# Patient Record
Sex: Female | Born: 1990 | Race: Black or African American | Hispanic: No | Marital: Single | Smoking: Never smoker
Health system: Southern US, Community
[De-identification: ages and names within clinical notes are randomized; demographics above are authoritative.]

## PROBLEM LIST (undated history)

## (undated) DIAGNOSIS — F329 Major depressive disorder, single episode, unspecified: Secondary | ICD-10-CM

## (undated) DIAGNOSIS — J45909 Unspecified asthma, uncomplicated: Secondary | ICD-10-CM

## (undated) DIAGNOSIS — I10 Essential (primary) hypertension: Secondary | ICD-10-CM

## (undated) DIAGNOSIS — F32A Depression, unspecified: Secondary | ICD-10-CM

## (undated) DIAGNOSIS — Z87442 Personal history of urinary calculi: Secondary | ICD-10-CM

## (undated) DIAGNOSIS — R7303 Prediabetes: Secondary | ICD-10-CM

## (undated) DIAGNOSIS — N200 Calculus of kidney: Secondary | ICD-10-CM

## (undated) DIAGNOSIS — F419 Anxiety disorder, unspecified: Secondary | ICD-10-CM

## (undated) DIAGNOSIS — D649 Anemia, unspecified: Secondary | ICD-10-CM

## (undated) DIAGNOSIS — T7840XA Allergy, unspecified, initial encounter: Secondary | ICD-10-CM

## (undated) HISTORY — DX: Depression, unspecified: F32.A

## (undated) HISTORY — DX: Allergy, unspecified, initial encounter: T78.40XA

## (undated) HISTORY — DX: Anxiety disorder, unspecified: F41.9

## (undated) HISTORY — DX: Anemia, unspecified: D64.9

## (undated) HISTORY — PX: KNEE SURGERY: SHX244

## (undated) HISTORY — PX: WISDOM TOOTH EXTRACTION: SHX21

## (undated) HISTORY — PX: TONSILLECTOMY: SUR1361

## (undated) HISTORY — DX: Calculus of kidney: N20.0

---
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
## 2011-11-20 DIAGNOSIS — R87810 Cervical high risk human papillomavirus (HPV) DNA test positive: Secondary | ICD-10-CM | POA: Insufficient documentation

## 2011-11-20 DIAGNOSIS — N870 Mild cervical dysplasia: Secondary | ICD-10-CM | POA: Insufficient documentation

## 2012-01-05 DIAGNOSIS — IMO0002 Reserved for concepts with insufficient information to code with codable children: Secondary | ICD-10-CM | POA: Insufficient documentation

## 2012-12-15 DIAGNOSIS — Z6836 Body mass index (BMI) 36.0-36.9, adult: Secondary | ICD-10-CM | POA: Insufficient documentation

## 2013-01-20 DIAGNOSIS — Z833 Family history of diabetes mellitus: Secondary | ICD-10-CM | POA: Insufficient documentation

## 2013-01-20 DIAGNOSIS — Z3480 Encounter for supervision of other normal pregnancy, unspecified trimester: Secondary | ICD-10-CM | POA: Insufficient documentation

## 2013-05-15 DIAGNOSIS — O1400 Mild to moderate pre-eclampsia, unspecified trimester: Secondary | ICD-10-CM

## 2013-05-15 HISTORY — DX: Mild to moderate pre-eclampsia, unspecified trimester: O14.00

## 2013-05-16 DIAGNOSIS — O1410 Severe pre-eclampsia, unspecified trimester: Secondary | ICD-10-CM

## 2013-06-27 DIAGNOSIS — I10 Essential (primary) hypertension: Secondary | ICD-10-CM | POA: Insufficient documentation

## 2013-06-27 DIAGNOSIS — Z0000 Encounter for general adult medical examination without abnormal findings: Secondary | ICD-10-CM | POA: Insufficient documentation

## 2013-08-19 DIAGNOSIS — Z975 Presence of (intrauterine) contraceptive device: Secondary | ICD-10-CM | POA: Insufficient documentation

## 2017-12-24 ENCOUNTER — Encounter: Payer: Self-pay | Admitting: Obstetrics

## 2017-12-24 ENCOUNTER — Ambulatory Visit (INDEPENDENT_AMBULATORY_CARE_PROVIDER_SITE_OTHER): Payer: No Typology Code available for payment source | Admitting: Obstetrics

## 2017-12-24 VITALS — BP 134/88 | HR 81 | Ht 60.0 in | Wt 210.5 lb

## 2017-12-24 DIAGNOSIS — Z01419 Encounter for gynecological examination (general) (routine) without abnormal findings: Secondary | ICD-10-CM

## 2017-12-24 DIAGNOSIS — Z124 Encounter for screening for malignant neoplasm of cervix: Secondary | ICD-10-CM

## 2017-12-24 DIAGNOSIS — Z113 Encounter for screening for infections with a predominantly sexual mode of transmission: Secondary | ICD-10-CM | POA: Diagnosis not present

## 2017-12-24 DIAGNOSIS — N898 Other specified noninflammatory disorders of vagina: Secondary | ICD-10-CM | POA: Diagnosis not present

## 2017-12-24 NOTE — Progress Notes (Signed)
Subjective:        April Davenport is a 27 y.o. female here for a routine exam.  Current complaints: NONE.    Personal health questionnaire:  Is patient Ashkenazi Jewish, have a family history of breast and/or ovarian cancer: no Is there a family history of uterine cancer diagnosed at age < 2050, gastrointestinal cancer, urinary tract cancer, family member who is a Personnel officerLynch syndrome-associated carrier: no Is the patient overweight and hypertensive, family history of diabetes, personal history of gestational diabetes, preeclampsia or PCOS: no Is patient over 3355, have PCOS,  family history of premature CHD under age 29, diabetes, smoke, have hypertension or peripheral artery disease:  no At any time, has a partner hit, kicked or otherwise hurt or frightened you?: no Over the past 2 weeks, have you felt down, depressed or hopeless?: no Over the past 2 weeks, have you felt little interest or pleasure in doing things?:no   Gynecologic History Patient's last menstrual period was 11/24/2017. Contraception: IUD Last Pap: unknown. Results were: unknown Last mammogram: n/a. Results were: n/a  Obstetric History OB History  Gravida Para Term Preterm AB Living  2 1     1 1   SAB TAB Ectopic Multiple Live Births  1       1    # Outcome Date GA Lbr Len/2nd Weight Sex Delivery Anes PTL Lv  2 Para 05/16/13 [id number]   F CS-LVertical Spinal  LIV  1 SAB             History reviewed. No pertinent past medical history.  Past Surgical History:  Procedure Laterality Date  . CESAREAN SECTION  05/16/2013     Current Outpatient Medications:  .  levonorgestrel (MIRENA) 20 MCG/24HR IUD, 1 each by Intrauterine route once., Disp: , Rfl:  No Known Allergies  Social History   Tobacco Use  . Smoking status: Never Smoker  . Smokeless tobacco: Never Used  Substance Use Topics  . Alcohol use: Not Currently    Frequency: Never    Family History  Problem Relation Age of Onset  . Diabetes Mother   . Heart  attack Father   . Diabetes Paternal Grandfather       Review of Systems  Constitutional: negative for fatigue and weight loss Respiratory: negative for cough and wheezing Cardiovascular: negative for chest pain, fatigue and palpitations Gastrointestinal: negative for abdominal pain and change in bowel habits Musculoskeletal:negative for myalgias Neurological: negative for gait problems and tremors Behavioral/Psych: negative for abusive relationship, depression Endocrine: negative for temperature intolerance    Genitourinary:negative for abnormal menstrual periods, genital lesions, hot flashes, sexual problems and vaginal discharge Integument/breast: negative for breast lump, breast tenderness, nipple discharge and skin lesion(s)    Objective:       BP 134/88   Pulse 81   Ht 5' (1.524 m)   Wt 210 lb 8 oz (95.5 kg)   LMP 11/24/2017   HC 1" (2.5 cm)   BMI 41.11 kg/m  General:   alert  Skin:   no rash or abnormalities  Lungs:   clear to auscultation bilaterally  Heart:   regular rate and rhythm, S1, S2 normal, no murmur, click, rub or gallop  Breasts:   normal without suspicious masses, skin or nipple changes or axillary nodes  Abdomen:  normal findings: no organomegaly, soft, non-tender and no hernia  Pelvis:  External genitalia: normal general appearance Urinary system: urethral meatus normal and bladder without fullness, nontender Vaginal: normal without tenderness, induration or  masses Cervix: normal appearance Adnexa: normal bimanual exam Uterus: anteverted and non-tender, normal size   Lab Review Urine pregnancy test Labs reviewed no Radiologic studies reviewed no  50% of 20 min visit spent on counseling and coordination of care.   Assessment:     1. Screen for STD (sexually transmitted disease) Rx: - Hepatitis B surface antigen - Hepatitis C antibody - HIV antibody - RPR - Cervicovaginal ancillary only  2. Vaginal discharge Rx: - Cervicovaginal  ancillary only  3. Encounter for routine gynecological examination with Papanicolaou smear of cervix Rx; - Cytology - PAP    Plan:    Education reviewed: calcium supplements, depression evaluation, low fat, low cholesterol diet, safe sex/STD prevention, self breast exams and weight bearing exercise. Contraception: IUD. Follow up in: 1 year.   No orders of the defined types were placed in this encounter.  Orders Placed This Encounter  Procedures  . Hepatitis B surface antigen  . Hepatitis C antibody  . HIV antibody  . RPR    Brock Bad MD 12-24-2017

## 2017-12-24 NOTE — Progress Notes (Signed)
Pt presents for annual, pap, and all STD testing. Pt aware insurance may not cover STD/BV/Yeast testing and agrees to be billed if not.

## 2017-12-25 LAB — CERVICOVAGINAL ANCILLARY ONLY
BACTERIAL VAGINITIS: POSITIVE — AB
CANDIDA VAGINITIS: NEGATIVE
CHLAMYDIA, DNA PROBE: NEGATIVE
Neisseria Gonorrhea: NEGATIVE
Trichomonas: NEGATIVE

## 2017-12-25 LAB — RPR: RPR Ser Ql: NONREACTIVE

## 2017-12-25 LAB — HIV ANTIBODY (ROUTINE TESTING W REFLEX): HIV Screen 4th Generation wRfx: NONREACTIVE

## 2017-12-25 LAB — HEPATITIS C ANTIBODY: Hep C Virus Ab: <0.1 s/co ratio (ref 0.0–0.9)

## 2017-12-25 LAB — CYTOLOGY - PAP: Diagnosis: NEGATIVE

## 2017-12-25 LAB — HEPATITIS B SURFACE ANTIGEN: HEP B S AG: NEGATIVE

## 2018-01-01 ENCOUNTER — Telehealth: Payer: Self-pay

## 2018-01-01 NOTE — Telephone Encounter (Signed)
Pt called for Test Results, Request call back please @ [phone].

## 2018-01-05 ENCOUNTER — Other Ambulatory Visit: Payer: Self-pay

## 2018-01-05 MED ORDER — METRONIDAZOLE 500 MG PO TABS: 500.0000 mg | ORAL_TABLET | Freq: Two times a day (BID) | ORAL | 0 refills | Status: DC

## 2018-01-05 NOTE — Telephone Encounter (Signed)
Call patient to inform her of positive BV results and the need to start taking flagyl to clear up infection.

## 2018-02-23 ENCOUNTER — Encounter (HOSPITAL_COMMUNITY): Payer: Self-pay | Admitting: Emergency Medicine

## 2018-02-23 ENCOUNTER — Emergency Department (HOSPITAL_COMMUNITY): Payer: No Typology Code available for payment source

## 2018-02-23 ENCOUNTER — Emergency Department (HOSPITAL_COMMUNITY)
Admission: EM | Admit: 2018-02-23 | Discharge: 2018-02-23 | Disposition: A | Discharge status: Home / Self Care | Payer: No Typology Code available for payment source | Attending: Emergency Medicine | Admitting: Emergency Medicine

## 2018-02-23 DIAGNOSIS — I10 Essential (primary) hypertension: Secondary | ICD-10-CM | POA: Insufficient documentation

## 2018-02-23 DIAGNOSIS — Z79899 Other long term (current) drug therapy: Secondary | ICD-10-CM | POA: Diagnosis not present

## 2018-02-23 DIAGNOSIS — R109 Unspecified abdominal pain: Secondary | ICD-10-CM

## 2018-02-23 DIAGNOSIS — R1032 Left lower quadrant pain: Secondary | ICD-10-CM | POA: Insufficient documentation

## 2018-02-23 HISTORY — DX: Essential (primary) hypertension: I10

## 2018-02-23 LAB — CBC WITH DIFFERENTIAL/PLATELET
Basophils Absolute: 0.1 10*3/uL (ref 0.0–0.1)
Basophils Relative: 1 %
EOS PCT: 3 %
Eosinophils Absolute: 0.2 10*3/uL (ref 0.0–0.7)
HEMATOCRIT: 40 % (ref 36.0–46.0)
HEMOGLOBIN: 13.4 g/dL (ref 12.0–15.0)
LYMPHS ABS: 3.7 10*3/uL (ref 0.7–4.0)
LYMPHS PCT: 52 %
MCH: 27.4 pg (ref 26.0–34.0)
MCHC: 33.5 g/dL (ref 30.0–36.0)
MCV: 81.8 fL (ref 78.0–100.0)
Monocytes Absolute: 0.4 10*3/uL (ref 0.1–1.0)
Monocytes Relative: 6 %
NEUTROS ABS: 2.6 10*3/uL (ref 1.7–7.7)
Neutrophils Relative %: 38 %
PLATELETS: 397 10*3/uL (ref 150–400)
RBC: 4.89 MIL/uL (ref 3.87–5.11)
RDW: 12.5 % (ref 11.5–15.5)
WBC: 6.9 10*3/uL (ref 4.0–10.5)

## 2018-02-23 LAB — URINALYSIS, ROUTINE W REFLEX MICROSCOPIC
BILIRUBIN URINE: NEGATIVE
Glucose, UA: NEGATIVE mg/dL
KETONES UR: NEGATIVE mg/dL
LEUKOCYTES UA: NEGATIVE
NITRITE: NEGATIVE
Protein, ur: 100 mg/dL — AB
Specific Gravity, Urine: 1.014 (ref 1.005–1.030)
pH: 9 — ABNORMAL HIGH (ref 5.0–8.0)

## 2018-02-23 LAB — I-STAT BETA HCG BLOOD, ED (MC, WL, AP ONLY): I-stat hCG, quantitative: <5 m[IU]/mL (ref <5)

## 2018-02-23 LAB — BASIC METABOLIC PANEL
Anion gap: 13 (ref 5–15)
BUN: 17 mg/dL (ref 6–20)
CHLORIDE: 104 mmol/L (ref 98–111)
CO2: 23 mmol/L (ref 22–32)
Calcium: 10 mg/dL (ref 8.9–10.3)
Creatinine, Ser: 0.79 mg/dL (ref 0.44–1.00)
GFR calc non Af Amer: >60 mL/min (ref >60)
Glucose, Bld: 116 mg/dL — ABNORMAL HIGH (ref 70–99)
Potassium: 3.2 mmol/L — ABNORMAL LOW (ref 3.5–5.1)
Sodium: 140 mmol/L (ref 135–145)

## 2018-02-23 MED ORDER — HYDROMORPHONE HCL 1 MG/ML IJ SOLN
1.0000 mg | Freq: Once | INTRAMUSCULAR | Status: AC
  Administered 2018-02-23: 1 mg via INTRAVENOUS
  Filled 2018-02-23: qty 1

## 2018-02-23 MED ORDER — POTASSIUM CHLORIDE CRYS ER 20 MEQ PO TBCR
40.0000 meq | EXTENDED_RELEASE_TABLET | Freq: Once | ORAL | Status: AC
  Administered 2018-02-23: 40 meq via ORAL
  Filled 2018-02-23: qty 2

## 2018-02-23 MED ORDER — ONDANSETRON HCL 4 MG/2ML IJ SOLN
4.0000 mg | Freq: Once | INTRAMUSCULAR | Status: AC
  Administered 2018-02-23: 4 mg via INTRAVENOUS
  Filled 2018-02-23: qty 2

## 2018-02-23 MED ORDER — POLYETHYLENE GLYCOL 3350 17 G PO PACK: 17.0000 g | PACK | Freq: Every day | ORAL | 0 refills | Status: DC

## 2018-02-23 MED ORDER — SODIUM CHLORIDE 0.9 % IV BOLUS
1000.0000 mL | Freq: Once | INTRAVENOUS | Status: AC
  Administered 2018-02-23: 1000 mL via INTRAVENOUS

## 2018-02-23 NOTE — ED Notes (Signed)
ED Provider at bedside. 

## 2018-02-23 NOTE — Discharge Instructions (Addendum)
You were evaluated in the emergency department for right-sided flank and abdominal pain.  You had blood work and a CAT scan.  There was some swelling of the kidney tube on the right side and you likely had a kidney stone pass on that side.  We are prescribing you some medication to help with constipation as you also look like you have a lot of stool on the CAT scan.  Please stay well-hydrated and take Tylenol or ibuprofen for pain.  We are giving the number for urology group to follow-up with.  Return if any concerns.

## 2018-02-23 NOTE — ED Triage Notes (Signed)
Pt reports was in [facility] Saturday with kidney stones. Pt screaming in pain on right side. Reports that does have blood in urine.

## 2018-02-23 NOTE — ED Provider Notes (Signed)
[facility]-EMERGENCY DEPT Provider Note   CSN: [id number] Arrival date & time: 02/23/18  0946     History   Chief Complaint Chief Complaint  Patient presents with  . Flank Pain    HPI April Davenport is a 27 y.o. female.  She presents to the emergency department with severe right flank into right lower quadrant abdominal pain.  She states she started with symptoms on Thursday and Saturday was at [facility] where they diagnosed her with a kidney stone and urine infection.  She is on antibiotics but not on any pain medicine.  She is been taking ibuprofen but the pain is getting worse and is now severe.  She is been having some blood in her urine.  Last menstrual period was about a month ago.  Denies any fevers or chills.  She is nauseous but has not vomited.  No diarrhea or constipation.  The history is provided by the patient.  Flank Pain  This is a new problem. The current episode started more than 2 days ago. The problem occurs constantly. The problem has been gradually worsening. Associated symptoms include abdominal pain. Pertinent negatives include no chest pain, no headaches and no shortness of breath. Nothing aggravates the symptoms. Nothing relieves the symptoms. The treatment provided no relief.    Past Medical History:  Diagnosis Date  . Hypertension     There are no active problems to display for this patient.   Past Surgical History:  Procedure Laterality Date  . CESAREAN SECTION  05/16/2013     OB History    Gravida  2   Para  1   Term      Preterm      AB  1   Living  1     SAB  1   TAB      Ectopic      Multiple      Live Births  1            Home Medications    Prior to Admission medications   Medication Sig Start Date End Date Taking? Authorizing Provider  levonorgestrel (MIRENA) 20 MCG/24HR IUD 1 each by Intrauterine route once. 09/05/14 09/05/19  [provider]  metroNIDAZOLE (FLAGYL) 500 MG  tablet Take 1 tablet (500 mg total) by mouth 2 (two) times daily. 01/05/18   Brock Bad, MD    Family History Family History  Problem Relation Age of Onset  . Diabetes Mother   . Heart attack Father   . Diabetes Paternal Grandfather     Social History Social History   Tobacco Use  . Smoking status: Never Smoker  . Smokeless tobacco: Never Used  Substance Use Topics  . Alcohol use: Not Currently    Frequency: Never  . Drug use: Never     Allergies   Patient has no known allergies.   Review of Systems Review of Systems  Constitutional: Negative for fever.  HENT: Negative for sore throat.   Eyes: Negative for visual disturbance.  Respiratory: Negative for shortness of breath.   Cardiovascular: Negative for chest pain.  Gastrointestinal: Positive for abdominal pain and nausea. Negative for vomiting.  Genitourinary: Positive for flank pain and hematuria. Negative for dysuria.  Musculoskeletal: Positive for back pain. Negative for neck pain.  Skin: Negative for rash.  Neurological: Negative for headaches.     Physical Exam Updated Vital Signs BP (!) 152/100 (BP Location: Right Arm)   Pulse 81   Temp 98.1  F (36.7 C) (Oral)   Resp 20   LMP 01/26/2018   SpO2 100%   Physical Exam  Constitutional: She appears well-developed and well-nourished. She appears distressed.  HENT:  Head: Normocephalic and atraumatic.  Eyes: Conjunctivae are normal.  Neck: Neck supple.  Cardiovascular: Normal rate and regular rhythm.  No murmur heard. Pulmonary/Chest: Effort normal and breath sounds normal. No respiratory distress.  Abdominal: Soft. There is no tenderness.  Musculoskeletal: She exhibits no edema or deformity.  Neurological: She is alert.  Skin: Skin is warm and dry.  Psychiatric: She has a normal mood and affect.  Nursing note and vitals reviewed.    ED Treatments / Results  Labs (all labs ordered are listed, but only abnormal results are displayed) Labs  Reviewed  BASIC METABOLIC PANEL - Abnormal; Notable for the following components:      Result Value   Potassium 3.2 (*)    Glucose, Bld 116 (*)    All other components within normal limits  URINALYSIS, ROUTINE W REFLEX MICROSCOPIC - Abnormal; Notable for the following components:   pH 9.0 (*)    Hgb urine dipstick MODERATE (*)    Protein, ur 100 (*)    RBC / HPF >50 (*)    Bacteria, UA FEW (*)    All other components within normal limits  CBC WITH DIFFERENTIAL/PLATELET  I-STAT BETA HCG BLOOD, ED ([facility], [facility], [facility] ONLY)    EKG EKG Interpretation  Date/Time:  Tuesday February 23 2018 10:36:44 EDT Ventricular Rate:  91 PR Interval:    QRS Duration: 89 QT Interval:  402 QTC Calculation: 495 R Axis:   69 Text Interpretation:  Sinus rhythm Borderline prolonged QT interval no prior to compare with Confirmed by Meridee ScoreButler, Collette Pescador [phone]([id number]) on 02/23/2018 11:13:21 AM   Radiology Ct Renal Stone Study  Result Date: 02/23/2018 CLINICAL DATA:  Right flank pain and hematuria EXAM: CT ABDOMEN AND PELVIS WITHOUT CONTRAST TECHNIQUE: Multidetector CT imaging of the abdomen and pelvis was performed following the standard protocol without oral or IV contrast. COMPARISON:  None. FINDINGS: Lower chest: Lung bases are clear. Hepatobiliary: No focal liver lesions are evident on this noncontrast enhanced study. There is no appreciable gallbladder wall thickening. No evident biliary duct dilatation. Pancreas: No pancreatic mass or inflammatory focus. Spleen: No splenic lesions are evident. Adrenals/Urinary Tract: Adrenals bilaterally appear normal. Right kidney is subtly edematous. There is no renal mass on either side. There is moderate hydronephrosis on the right. There is no hydronephrosis on the left. There is no intrarenal calculus on either side. There is no appreciable ureteral calculus on either side. The right ureter is prominent. Urinary bladder is midline with wall thickness within normal limits.  Stomach/Bowel: There is moderate stool in the colon. There is no appreciable bowel wall or mesenteric thickening. No evident bowel obstruction. No free air or portal venous air. Stomach is borderline distended with food material and fluid. Vascular/Lymphatic: No abdominal aortic aneurysm. No arterial vascular calcification evident. No adenopathy evident in the abdomen or pelvis by size criteria. Several small mesenteric lymph nodes are noted in the right mid to lower abdomen. Reproductive: Uterus is anteverted. Intrauterine device is positioned within the endometrium. There is no pelvic mass. A small amount of fluid tracks from the left ovary along the lateral distal sigmoid colon. Other: Appendix appears normal. No abscess or ascites is evident in the abdomen or pelvis. There is a small ventral hernia containing only fat. Musculoskeletal: There are no blastic or lytic bone lesions.  No intramuscular lesions are evident. IMPRESSION: 1. Moderate hydronephrosis and ureterectasis on the right without appreciable ureteral calculus. Question recent calculus passage. Pyelonephritis could present similarly. Right kidney is subtly edematous. Note that there is no perinephric fluid or stranding, and no renal abscess noted on the right. 2.  No intrarenal calculi evident in either kidney. 3. No bowel obstruction. No free air or portal venous air. No abscess in the abdomen or pelvis. Appendix appears normal. 4. Several small lymph nodes in the right mid to lower abdomen. Question reactive etiology due to the changes involving the right kidney and ureter. A degree of mesenteric adenitis in the appropriate clinical setting could also present in this manner. 5.  Intrauterine device positioned within the endometrium. 6.  Small ventral hernia containing only fat. Electronically Signed   By: Bretta Bang III M.D.   On: 02/23/2018 11:59    Procedures Procedures (including critical care time)  Medications Ordered in  ED Medications  sodium chloride 0.9 % bolus 1,000 mL (has no administration in time range)  ondansetron (ZOFRAN) injection 4 mg (has no administration in time range)  HYDROmorphone (DILAUDID) injection 1 mg (has no administration in time range)     Initial Impression / Assessment and Plan / ED Course  I have reviewed the triage vital signs and the nursing notes.  Pertinent labs & imaging results that were available during my care of the patient were reviewed by me and considered in my medical decision making (see chart for details).  Clinical Course as of Feb 24 1740  Tue Feb 23, 2018  4740 27 year old female with recent diagnosis of kidney stones by CT here with worsening right flank and right lower quadrant abdominal pain.  There is no report on chart review her care everywhere regarding the patient's imaging or work-up from prior ED visit in [facility].  Will initially start with blood work urinalysis and pain control.   [MB]  1049 About 15 minutes after patient received Dilaudid she was very somnolent.  We placed her on some nasal cannula oxygen and put her on a cardiac monitor.  She states she felt a little itchy in the face and tingly all over.  Pain is improved.   [MB]  1217 CT does not show an obvious stone on the right side although does have some element of hydro-question passed stone.   [MB]  1310 I reviewed the results of the patient's CAT scan lab work and urinalysis of her.  My guess is that she had a stone on the right that is passed and still has residual discomfort from the hydro-.  Her potassium was little low here 3.2 and that will be repleted.  She is already on antibiotics for an equivocal UTI.  I am also going to prescribe her some laxative and give her the number for urologist for follow-up.  All questions were answered and she understands indications for return.   [MB]    Clinical Course User Index [MB] Terrilee Files, MD     Final Clinical  Impressions(s) / ED Diagnoses   Final diagnoses:  Right flank pain    ED Discharge Orders         Ordered    polyethylene glycol (MIRALAX / GLYCOLAX) packet  Daily     02/23/18 1312           Terrilee Files, MD 02/23/18 [phone]

## 2018-02-24 ENCOUNTER — Encounter: Payer: Self-pay | Admitting: Radiology

## 2018-02-25 ENCOUNTER — Encounter: Payer: Self-pay | Admitting: Obstetrics and Gynecology

## 2018-02-25 ENCOUNTER — Ambulatory Visit (INDEPENDENT_AMBULATORY_CARE_PROVIDER_SITE_OTHER): Payer: No Typology Code available for payment source | Admitting: Obstetrics and Gynecology

## 2018-02-25 VITALS — BP 134/85 | HR 71 | Wt 213.0 lb

## 2018-02-25 DIAGNOSIS — Z30432 Encounter for removal of intrauterine contraceptive device: Secondary | ICD-10-CM

## 2018-02-25 MED ORDER — NORETHINDRONE 0.35 MG PO TABS: ORAL_TABLET | ORAL | 3 refills | Status: DC

## 2018-02-25 NOTE — Progress Notes (Signed)
error 

## 2018-02-25 NOTE — Procedures (Addendum)
Intrauterine Device (IUD) Removal Procedure Note  Patient recently diagnosed with right sided stone and still having some residual inflammation and pain, and she states she would like to have everything foreign removed from her. Mirena placed March 2016 for period control and contraception. Patient has gained weight on depo and d/w her re: nexplanon, paragard and she is not interested in those but would like pills. D/w here only progestin only options are best given her HTN. Last pap and STI testing earlier this month in a [place] clinic.   Prior to the procedure being performed, the patient (or guardian) was asked to state their full name, date of birth, and the type of procedure being performed. EGBUS normal. Vaginal vault normal. Cervix normal with IUD strings seen (approx 3cm in length). Strings grasped with ringed forceps and easily removed and noted to be intact.   No complications, patient tolerated the procedure well.  Camila sent in and pt told to consider effective in one week and about its 3 hour window for use.   Patient to sign ROI to get pap smear records.   Cornelia Copaharlie Rylea Selway, Jr MD Attending Center [facility] 02/25/2018

## 2018-02-25 NOTE — Progress Notes (Signed)
Last pap 01/2018

## 2018-03-04 ENCOUNTER — Telehealth: Payer: Self-pay | Admitting: Radiology

## 2018-03-04 NOTE — Telephone Encounter (Signed)
Left message for patient to call [id number] to give information needed of the name of the office that we need to request records from. Medical Records request form incomplete.

## 2018-03-18 ENCOUNTER — Encounter: Payer: Self-pay | Admitting: Internal Medicine

## 2018-03-18 ENCOUNTER — Ambulatory Visit (INDEPENDENT_AMBULATORY_CARE_PROVIDER_SITE_OTHER): Payer: No Typology Code available for payment source | Admitting: Internal Medicine

## 2018-03-18 VITALS — BP 126/84 | HR 87 | Temp 98.5°F | Wt 213.0 lb

## 2018-03-18 DIAGNOSIS — Z87442 Personal history of urinary calculi: Secondary | ICD-10-CM | POA: Insufficient documentation

## 2018-03-18 DIAGNOSIS — I10 Essential (primary) hypertension: Secondary | ICD-10-CM | POA: Diagnosis not present

## 2018-03-18 NOTE — Assessment & Plan Note (Signed)
Borderline off meds Will continue to hold and monitor for now Reinforced DASH diet and exercise for weight loss

## 2018-03-18 NOTE — Assessment & Plan Note (Signed)
Will write letter of medical necessicity for [facility] bariatric program

## 2018-03-18 NOTE — Patient Instructions (Signed)

## 2018-03-18 NOTE — Progress Notes (Signed)
HPI  Pt presents to the clinic today to establish care and for management of the conditions listed below. She is transferring care from [place].  HTN: Her BP today is 126/84. She is not taking Lisinopril as prescribed. She reports it makes her feel fatigued and lightheaded. ECG from 02/2018 reviewed.  History of Kidney Stones: She is following with urology. Her last attack was about 1 month ago.  She also wants to talk about her weight. She reports she is interested in the gastric sleeve. She has tried meal prepping, Nutrisystem, Atkin's diet, Slim Fast, Low Calorie Diet. She is having trouble finding time to exercise outside her work schedule.  Flu: 03/2017 Tetanus: 2014 Pap Smear: 12/2017 Dentist: biannually  Past Medical History:  Diagnosis Date  . Hypertension   . Right nephrolithiasis     Current Outpatient Medications  Medication Sig Dispense Refill  . ibuprofen (ADVIL,MOTRIN) 200 MG tablet Take 600 mg by mouth every 6 (six) hours as needed for moderate pain.    Marland Kitchen lisinopril (PRINIVIL,ZESTRIL) 10 MG tablet Take 10 mg by mouth daily.    . norethindrone (CAMILA) 0.35 MG tablet One pill PO at the same time qday. If you are late by 3 hours, then continue to take but use back up for 7 days 3 Package 3   No current facility-administered medications for this visit.     Allergies  Allergen Reactions  . Black [employer] and Swelling    Family History  Problem Relation Age of Onset  . Diabetes Mother   . Heart attack Father   . Diabetes Paternal Grandfather     Social History   Socioeconomic History  . Marital status: Single    Spouse name: Not on file  . Number of children: Not on file  . Years of education: Not on file  . Highest education level: Not on file  Occupational History  . Not on file  Social Needs  . Financial resource strain: Not on file  . Food insecurity:    Worry: Not on file    Inability: Not on file  . Transportation needs:    Medical:  Not on file    Non-medical: Not on file  Tobacco Use  . Smoking status: Never Smoker  . Smokeless tobacco: Never Used  Substance and Sexual Activity  . Alcohol use: Not Currently    Frequency: Never  . Drug use: Never  . Sexual activity: Yes    Birth control/protection: IUD  Lifestyle  . Physical activity:    Days per week: Not on file    Minutes per session: Not on file  . Stress: Not on file  Relationships  . Social connections:    Talks on phone: Not on file    Gets together: Not on file    Attends religious service: Not on file    Active member of club or organization: Not on file    Attends meetings of clubs or organizations: Not on file    Relationship status: Not on file  . Intimate partner violence:    Fear of current or ex partner: Not on file    Emotionally abused: Not on file    Physically abused: Not on file    Forced sexual activity: Not on file  Other Topics Concern  . Not on file  Social History Narrative  . Not on file    ROS:  Constitutional: Denies fever, malaise, fatigue, headache or abrupt weight changes.  HEENT: Denies eye pain,  eye redness, ear pain, ringing in the ears, wax buildup, runny nose, nasal congestion, bloody nose, or sore throat. Respiratory: Denies difficulty breathing, shortness of breath, cough or sputum production.   Cardiovascular: Denies chest pain, chest tightness, palpitations or swelling in the hands or feet.  Gastrointestinal: Denies abdominal pain, bloating, constipation, diarrhea or blood in the stool.  GU: Denies frequency, urgency, pain with urination, blood in urine, odor or discharge. Musculoskeletal: Denies decrease in range of motion, difficulty with gait, muscle pain or joint pain and swelling.  Skin: Denies redness, rashes, lesions or ulcercations.  Neurological: Denies dizziness, difficulty with memory, difficulty with speech or problems with balance and coordination.  Psych: Denies anxiety, depression, SI/HI.  No  other specific complaints in a complete review of systems (except as listed in HPI above).  PE:  BP 126/84   Pulse 87   Temp 98.5 F (36.9 C) (Oral)   Wt 213 lb (96.6 kg)   LMP 02/26/2018   SpO2 98%   BMI 41.60 kg/m   Wt Readings from Last 3 Encounters:  03/18/18 213 lb (96.6 kg)  02/25/18 213 lb (96.6 kg)  12/24/17 210 lb 8 oz (95.5 kg)    General: Appears her stated age, obese, in NAD. Skin: Dry and intact. Cardiovascular: Normal rate and rhythm. S1,S2 noted.  No murmur, rubs or gallops noted.  Pulmonary/Chest: Normal effort and positive vesicular breath sounds. No respiratory distress. No wheezes, rales or ronchi noted.  Neurological: Alert and oriented.  Psychiatric: Mood and affect normal. Behavior is normal. Judgment and thought content normal.     BMET    Component Value Date/Time   NA 140 02/23/2018 1003   K 3.2 (L) 02/23/2018 1003   CL 104 02/23/2018 1003   CO2 23 02/23/2018 1003   GLUCOSE 116 (H) 02/23/2018 1003   BUN 17 02/23/2018 1003   CREATININE 0.79 02/23/2018 1003   CALCIUM 10.0 02/23/2018 1003   GFRNONAA >60 02/23/2018 1003   GFRAA >60 02/23/2018 1003    Lipid Panel  No results found for: CHOL, TRIG, HDL, CHOLHDL, VLDL, LDLCALC  CBC    Component Value Date/Time   WBC 6.9 02/23/2018 1003   RBC 4.89 02/23/2018 1003   HGB 13.4 02/23/2018 1003   HCT 40.0 02/23/2018 1003   PLT 397 02/23/2018 1003   MCV 81.8 02/23/2018 1003   MCH 27.4 02/23/2018 1003   MCHC 33.5 02/23/2018 1003   RDW 12.5 02/23/2018 1003   LYMPHSABS 3.7 02/23/2018 1003   MONOABS 0.4 02/23/2018 1003   EOSABS 0.2 02/23/2018 1003   BASOSABS 0.1 02/23/2018 1003    Hgb A1C No results found for: HGBA1C   Assessment and Plan:

## 2018-03-18 NOTE — Assessment & Plan Note (Signed)
Currently following with urology

## 2018-03-24 ENCOUNTER — Encounter: Payer: Self-pay | Admitting: Internal Medicine

## 2018-04-30 NOTE — Telephone Encounter (Signed)
Entered in error

## 2018-05-13 ENCOUNTER — Encounter: Payer: Self-pay | Admitting: Radiology

## 2018-11-17 ENCOUNTER — Telehealth: Payer: Self-pay | Admitting: *Deleted

## 2018-11-17 NOTE — Telephone Encounter (Signed)
Left message for pt, that I will call her back in the morning and if pain does become severe or worse to go to nearest ED.

## 2018-11-17 NOTE — Telephone Encounter (Signed)
-----   Message from Allena Earing, NT sent at 11/17/2018 11:13 AM EDT ----- Regarding: patient has question stabbing pains on right side Please call patient, she is having pains on right side with no bleeding and wanting to speak with someone about it New OB schedule for July

## 2018-12-08 NOTE — Progress Notes (Signed)
History:   April Davenport is a 28 y.o. [phone] at [redacted]w[redacted]d by LMP, early ultrasound being seen today for her first obstetrical visit.  Her obstetrical history is significant for history of CHTN with superimposed severe pre-eclampsia needing cesarean delivery at 32 weeks in 2014. Cesarean was done as she was remote from vaginal delivery and her BP were "stroke level".  Patient does intend to breast feed. Pregnancy history fully reviewed.  Patient reports no complaints.      HISTORY: OB History  Gravida Para Term Preterm AB Living  3 1 0 0 1 1  SAB TAB Ectopic Multiple Live Births  1 0 0 0 1    # Outcome Date GA Lbr Len/2nd Weight Sex Delivery Anes PTL Lv  3 Current           2 Para 05/16/13 [redacted]w[redacted]d   F CS-LTranv Spinal  LIV  1 SAB             Last pap smear was done 12/2017 and was normal  Past Medical History:  Diagnosis Date  . Hypertension   . Right nephrolithiasis    Past Surgical History:  Procedure Laterality Date  . CESAREAN SECTION  05/16/2013  . KNEE SURGERY Right    Family History  Problem Relation Age of Onset  . Diabetes Mother   . Heart attack Father   . Diabetes Paternal Grandfather    Social History   Tobacco Use  . Smoking status: Never Smoker  . Smokeless tobacco: Never Used  Substance Use Topics  . Alcohol use: Yes    Frequency: Never    Comment: rare  . Drug use: Never   Allergies  Allergen Reactions  . Dilaudid [Hydromorphone Hcl] Hives, Shortness Of Breath and Itching    Syncope  . [employer] and Swelling   Current Outpatient Medications on File Prior to Visit  Medication Sig Dispense Refill  . Prenatal Vit-Fe Fumarate-FA (PRENATAL MULTIVITAMIN) TABS tablet Take 1 tablet by mouth daily at 12 noon.    Marland Kitchen acetaminophen (TYLENOL) 325 MG tablet Take 650 mg by mouth every 6 (six) hours as needed.    Marland Kitchen ibuprofen (ADVIL,MOTRIN) 200 MG tablet Take 600 mg by mouth every 6 (six) hours as needed for moderate pain.     No current  facility-administered medications on file prior to visit.     Review of Systems Pertinent items noted in HPI and remainder of comprehensive ROS otherwise negative. Physical Exam:   Vitals:   12/09/18 0851  BP: 120/81  Pulse: 90  Weight: 212 lb (96.2 kg)   Bedside Ultrasound for FHR check:Fetal Heart Rate (bpm): 150's  Patient informed that the ultrasound is considered a limited obstetric ultrasound and is not intended to be a complete ultrasound exam.  Patient also informed that the ultrasound is not being completed with the intent of assessing for fetal or placental anomalies or any pelvic abnormalities.  Explained that the purpose of today's ultrasound is to assess for fetal heart rate.  Patient acknowledges the purpose of the exam and the limitations of the study.   General: well-developed, well-nourished female in no acute distress  Breasts:  normal appearance, no masses or tenderness bilaterally  Skin: normal coloration and turgor, no rashes  Neurologic: oriented, normal, negative, normal mood  Extremities: normal strength, tone, and muscle mass, ROM of all joints is normal  HEENT PERRLA, extraocular movement intact and sclera clear, anicteric  Mouth/Teeth mucous membranes moist, pharynx normal without lesions and  dental hygiene good  Neck supple and no masses  Cardiovascular: regular rate and rhythm  Respiratory:  no respiratory distress, normal breath sounds  Abdomen: soft, non-tender; bowel sounds normal; no masses,  no organomegaly  Pelvic: deferred    Assessment:    Pregnancy: [id number] Patient Active Problem List   Diagnosis Date Noted  . Preexisting hypertension complicating pregnancy, antepartum 12/09/2018  . Supervision of high-risk pregnancy 12/09/2018  . History of low transverse cesarean section x 1 12/09/2018  . History of severe pre-eclampsia 12/09/2018  . Maternal morbid obesity, antepartum (HCC) 12/09/2018  . Previous preterm delivery, antepartum 12/09/2018   . Essential hypertension 03/18/2018  . History of kidney stones 03/18/2018  . Obesity, Class III, BMI 40-49.9 (morbid obesity) (HCC) 03/18/2018     Plan:    1. Preexisting hypertension complicating pregnancy, antepartum 2. History of severe pre-eclampsia 3. Previous preterm delivery, antepartum Stable BP, no medications for a few years.  None needed. Baseline labs drawn. - aspirin EC 81 MG tablet; Take 1 tablet (81 mg total) by mouth daily. Take after 12 weeks for prevention of preeclampsia later in pregnancy  Dispense: 300 tablet; Refill: 2 - Comprehensive metabolic panel - US MFM OB DETAIL +14 WK; Future  4. Maternal morbid obesity, antepartum [facility](HCC) Nutrition consult done. Recommended 11-20 lb gain.  - Hemoglobin A1c - TSH - Referral to Nutrition and Diabetes Services - US MFM OB DETAIL +14 WK; Future  5. History of low transverse cesarean section x 1 LTCS, candidate for TOLAC  6. History of kidney stones Risk increased in pregnancy, discussed with patient.  7. Supervision of high risk pregnancy, antepartum - Obstetric Panel, Including HIV - Enroll Patient in Babyscripts - Culture, OB Urine - GC/Chlamydia probe amp ([place])not at [facility] - Babyscripts Schedule Optimization - Genetic Screening; Future - Protein / creatinine ratio, urine - US MFM OB DETAIL +14 WK; Future Initial labs drawn. Continue prenatal vitamins. Genetic Screening discussed, NIPS: considering Ultrasound discussed; fetal anatomic survey: ordered. Problem list reviewed and updated. The nature of [facility] with multiple MDs and other Advanced Practice Providers was explained to patient; also emphasized that residents, students are part of our team. Routine obstetric precautions reviewed. Return in about 3 weeks (around 12/30/2018) for Lab Visit (Panorama)    4 weeks: Virtual OB Visit.     Jaynie CollinsUGONNA  Hulen Mandler, MD, FACOG Obstetrician & Gynecologist, [facility]

## 2018-12-09 ENCOUNTER — Other Ambulatory Visit: Payer: Self-pay

## 2018-12-09 ENCOUNTER — Encounter: Payer: Self-pay | Admitting: Obstetrics & Gynecology

## 2018-12-09 ENCOUNTER — Ambulatory Visit (INDEPENDENT_AMBULATORY_CARE_PROVIDER_SITE_OTHER): Payer: Federal, State, Local not specified - PPO | Admitting: Obstetrics & Gynecology

## 2018-12-09 ENCOUNTER — Other Ambulatory Visit (HOSPITAL_COMMUNITY)
Admission: RE | Admit: 2018-12-09 | Discharge: 2018-12-09 | Disposition: A | Discharge status: Home / Self Care | Payer: Federal, State, Local not specified - PPO | Source: Ambulatory Visit | Attending: Obstetrics & Gynecology | Admitting: Obstetrics & Gynecology

## 2018-12-09 VITALS — BP 120/81 | HR 90 | Wt 212.0 lb

## 2018-12-09 DIAGNOSIS — R7989 Other specified abnormal findings of blood chemistry: Secondary | ICD-10-CM | POA: Diagnosis not present

## 2018-12-09 DIAGNOSIS — Z98891 History of uterine scar from previous surgery: Secondary | ICD-10-CM | POA: Insufficient documentation

## 2018-12-09 DIAGNOSIS — O99210 Obesity complicating pregnancy, unspecified trimester: Secondary | ICD-10-CM

## 2018-12-09 DIAGNOSIS — O0990 Supervision of high risk pregnancy, unspecified, unspecified trimester: Secondary | ICD-10-CM | POA: Insufficient documentation

## 2018-12-09 DIAGNOSIS — Z87442 Personal history of urinary calculi: Secondary | ICD-10-CM

## 2018-12-09 DIAGNOSIS — O09219 Supervision of pregnancy with history of pre-term labor, unspecified trimester: Secondary | ICD-10-CM | POA: Insufficient documentation

## 2018-12-09 DIAGNOSIS — E669 Obesity, unspecified: Secondary | ICD-10-CM | POA: Insufficient documentation

## 2018-12-09 DIAGNOSIS — O10919 Unspecified pre-existing hypertension complicating pregnancy, unspecified trimester: Secondary | ICD-10-CM | POA: Diagnosis not present

## 2018-12-09 DIAGNOSIS — Z3A09 9 weeks gestation of pregnancy: Secondary | ICD-10-CM

## 2018-12-09 DIAGNOSIS — O09211 Supervision of pregnancy with history of pre-term labor, first trimester: Secondary | ICD-10-CM

## 2018-12-09 DIAGNOSIS — O0991 Supervision of high risk pregnancy, unspecified, first trimester: Secondary | ICD-10-CM

## 2018-12-09 DIAGNOSIS — Z8759 Personal history of other complications of pregnancy, childbirth and the puerperium: Secondary | ICD-10-CM

## 2018-12-09 DIAGNOSIS — O99211 Obesity complicating pregnancy, first trimester: Secondary | ICD-10-CM

## 2018-12-09 DIAGNOSIS — O10911 Unspecified pre-existing hypertension complicating pregnancy, first trimester: Secondary | ICD-10-CM | POA: Diagnosis not present

## 2018-12-09 MED ORDER — BLOOD PRESSURE CUFF MISC: 1.0000 | 0 refills | Status: DC

## 2018-12-09 MED ORDER — ASPIRIN EC 81 MG PO TBEC: 81.0000 mg | DELAYED_RELEASE_TABLET | Freq: Every day | ORAL | 2 refills | Status: DC

## 2018-12-09 NOTE — Patient Instructions (Signed)
First Trimester of Pregnancy °The first trimester of pregnancy is from week 1 until the end of week 13 (months 1 through 3). A week after a sperm fertilizes an egg, the egg will implant on the wall of the uterus. This embryo will begin to develop into a baby. Genes from you and your partner will form the baby. The female genes will determine whether the baby will be a boy or a girl. At 6-8 weeks, the eyes and face will be formed, and the heartbeat can be seen on ultrasound. At the end of 12 weeks, all the baby's organs will be formed. °Now that you are pregnant, you will want to do everything you can to have a healthy baby. Two of the most important things are to get good prenatal care and to follow your health care provider's instructions. Prenatal care is all the medical care you receive before the baby's birth. This care will help prevent, find, and treat any problems during the pregnancy and childbirth. °Body changes during your first trimester °Your body goes through many changes during pregnancy. The changes vary from woman to woman. °· You may gain or lose a couple of pounds at first. °· You may feel sick to your stomach (nauseous) and you may throw up (vomit). If the vomiting is uncontrollable, call your health care provider. °· You may tire easily. °· You may develop headaches that can be relieved by medicines. All medicines should be approved by your health care provider. °· You may urinate more often. Painful urination may mean you have a bladder infection. °· You may develop heartburn as a result of your pregnancy. °· You may develop constipation because certain hormones are causing the muscles that push stool through your intestines to slow down. °· You may develop hemorrhoids or swollen veins (varicose veins). °· Your breasts may begin to grow larger and become tender. Your nipples may stick out more, and the tissue that surrounds them (areola) may become darker. °· Your gums may bleed and may be  sensitive to brushing and flossing. °· Dark spots or blotches (chloasma, mask of pregnancy) may develop on your face. This will likely fade after the baby is born. °· Your menstrual periods will stop. °· You may have a loss of appetite. °· You may develop cravings for certain kinds of food. °· You may have changes in your emotions from day to day, such as being excited to be pregnant or being concerned that something may go wrong with the pregnancy and baby. °· You may have more vivid and strange dreams. °· You may have changes in your hair. These can include thickening of your hair, rapid growth, and changes in texture. Some women also have hair loss during or after pregnancy, or hair that feels dry or thin. Your hair will most likely return to normal after your baby is born. °What to expect at prenatal visits °During a routine prenatal visit: °· You will be weighed to make sure you and the baby are growing normally. °· Your blood pressure will be taken. °· Your abdomen will be measured to track your baby's growth. °· The fetal heartbeat will be listened to between weeks 10 and 14 of your pregnancy. °· Test results from any previous visits will be discussed. °Your health care provider may ask you: °· How you are feeling. °· If you are feeling the baby move. °· If you have had any abnormal symptoms, such as leaking fluid, bleeding, severe headaches, or abdominal   cramping. °· If you are using any tobacco products, including cigarettes, chewing tobacco, and electronic cigarettes. °· If you have any questions. °Other tests that may be performed during your first trimester include: °· Blood tests to find your blood type and to check for the presence of any previous infections. The tests will also be used to check for low iron levels (anemia) and protein on red blood cells (Rh antibodies). Depending on your risk factors, or if you previously had diabetes during pregnancy, you may have tests to check for high blood sugar  that affects pregnant women (gestational diabetes). °· Urine tests to check for infections, diabetes, or protein in the urine. °· An ultrasound to confirm the proper growth and development of the baby. °· Fetal screens for spinal cord problems (spina bifida) and Down syndrome. °· HIV (human immunodeficiency virus) testing. Routine prenatal testing includes screening for HIV, unless you choose not to have this test. °· You may need other tests to make sure you and the baby are doing well. °Follow these instructions at home: °Medicines °· Follow your health care provider's instructions regarding medicine use. Specific medicines may be either safe or unsafe to take during pregnancy. °· Take a prenatal vitamin that contains at least 600 micrograms (mcg) of folic acid. °· If you develop constipation, try taking a stool softener if your health care provider approves. °Eating and drinking ° °· Eat a balanced diet that includes fresh fruits and vegetables, whole grains, good sources of protein such as meat, eggs, or tofu, and low-fat dairy. Your health care provider will help you determine the amount of weight gain that is right for you. °· Avoid raw meat and uncooked cheese. These carry germs that can cause birth defects in the baby. °· Eating four or five small meals rather than three large meals a day may help relieve nausea and vomiting. If you start to feel nauseous, eating a few soda crackers can be helpful. Drinking liquids between meals, instead of during meals, also seems to help ease nausea and vomiting. °· Limit foods that are high in fat and processed sugars, such as fried and sweet foods. °· To prevent constipation: °? Eat foods that are high in fiber, such as fresh fruits and vegetables, whole grains, and beans. °? Drink enough fluid to keep your urine clear or pale yellow. °Activity °· Exercise only as directed by your health care provider. Most women can continue their usual exercise routine during  pregnancy. Try to exercise for 30 minutes at least 5 days a week. Exercising will help you: °? Control your weight. °? Stay in shape. °? Be prepared for labor and delivery. °· Experiencing pain or cramping in the lower abdomen or lower back is a good sign that you should stop exercising. Check with your health care provider before continuing with normal exercises. °· Try to avoid standing for long periods of time. Move your legs often if you must stand in one place for a long time. °· Avoid heavy lifting. °· Wear low-heeled shoes and practice good posture. °· You may continue to have sex unless your health care provider tells you not to. °Relieving pain and discomfort °· Wear a good support bra to relieve breast tenderness. °· Take warm sitz baths to soothe any pain or discomfort caused by hemorrhoids. Use hemorrhoid cream if your health care provider approves. °· Rest with your legs elevated if you have leg cramps or low back pain. °· If you develop varicose veins in   your legs, wear support hose. Elevate your feet for 15 minutes, 3-4 times a day. Limit salt in your diet. Prenatal care  Schedule your prenatal visits by the twelfth week of pregnancy. They are usually scheduled monthly at first, then more often in the last 2 months before delivery.  Write down your questions. Take them to your prenatal visits.  Keep all your prenatal visits as told by your health care provider. This is important. Safety  Wear your seat belt at all times when driving.  Make a list of emergency phone numbers, including numbers for family, friends, the hospital, and police and fire departments. General instructions  Ask your health care provider for a referral to a local prenatal education class. Begin classes no later than the beginning of month 6 of your pregnancy.  Ask for help if you have counseling or nutritional needs during pregnancy. Your health care provider can offer advice or refer you to specialists for help  with various needs.  Do not use hot tubs, steam rooms, or saunas.  Do not douche or use tampons or scented sanitary pads.  Do not cross your legs for long periods of time.  Avoid cat litter boxes and soil used by cats. These carry germs that can cause birth defects in the baby and possibly loss of the fetus by miscarriage or stillbirth.  Avoid all smoking, herbs, alcohol, and medicines not prescribed by your health care provider. Chemicals in these products affect the formation and growth of the baby.  Do not use any products that contain nicotine or tobacco, such as cigarettes and e-cigarettes. If you need help quitting, ask your health care provider. You may receive counseling support and other resources to help you quit.  Schedule a dentist appointment. At home, brush your teeth with a soft toothbrush and be gentle when you floss. Contact a health care provider if:  You have dizziness.  You have mild pelvic cramps, pelvic pressure, or nagging pain in the abdominal area.  You have persistent nausea, vomiting, or diarrhea.  You have a bad smelling vaginal discharge.  You have pain when you urinate.  You notice increased swelling in your face, hands, legs, or ankles.  You are exposed to fifth disease or chickenpox.  You are exposed to [place] measles (rubella) and have never had it. Get help right away if:  You have a fever.  You are leaking fluid from your vagina.  You have spotting or bleeding from your vagina.  You have severe abdominal cramping or pain.  You have rapid weight gain or loss.  You vomit blood or material that looks like coffee grounds.  You develop a severe headache.  You have shortness of breath.  You have any kind of trauma, such as from a fall or a car accident. Summary  The first trimester of pregnancy is from week 1 until the end of week 13 (months 1 through 3).  Your body goes through many changes during pregnancy. The changes vary from  woman to woman.  You will have routine prenatal visits. During those visits, your health care provider will examine you, discuss any test results you may have, and talk with you about how you are feeling. This information is not intended to replace advice given to you by your health care provider. Make sure you discuss any questions you have with your health care provider. Document Released: 05/20/2001 Document Revised: 05/08/2017 Document Reviewed: 05/07/2016 Elsevier Patient Education  2020 [employer]  Hypertension During Pregnancy High blood pressure (hypertension) is when the force of blood pumping through the arteries is too strong. Arteries are blood vessels that carry blood from the heart throughout the body. Hypertension during pregnancy can be mild or severe. Severe hypertension during pregnancy (preeclampsia) is a medical emergency that requires prompt evaluation and treatment. Different types of hypertension can happen during pregnancy. These include:  Chronic hypertension. This happens when you had high blood pressure before you became pregnant, and it continues during the pregnancy. Hypertension that develops before you are [redacted] weeks pregnant and continues during the pregnancy is also called chronic hypertension. If you have chronic hypertension, it will not go away after you have your baby. You will need follow-up visits with your health care provider after you have your baby. Your doctor may want you to keep taking medicine for your blood pressure.  Gestational hypertension. This is hypertension that develops after the 20th week of pregnancy. Gestational hypertension usually goes away after you have your baby, but your health care provider will need to monitor your blood pressure to make sure that it is getting better.  Preeclampsia. This is severe hypertension during pregnancy. This can cause serious complications for you and your baby and can also cause complications for you  after the delivery of your baby.  Postpartum preeclampsia. You may develop severe hypertension after giving birth. This usually occurs within 48 hours after childbirth but may occur up to 6 weeks after giving birth. This is rare. How does this affect me? Women who have hypertension during pregnancy have a greater chance of developing hypertension later in life or during future pregnancies. In some cases, hypertension during pregnancy can cause serious complications, such as:  Stroke.  Heart attack.  Injury to other organs, such as kidneys, lungs, or liver.  Preeclampsia.  Convulsions or seizures.  Placental abruption. How does this affect my baby? Hypertension during pregnancy can affect your baby. Your baby may:  Be born early (prematurely).  Not weigh as much as he or she should at birth (low birth weight).  Not tolerate labor well, leading to an unplanned cesarean delivery. What are the risks? There are certain factors that make it more likely for you to develop hypertension during pregnancy. These include:  Having hypertension during a previous pregnancy.  Being overweight.  Being age 77 or older.  Being pregnant for the first time.  Being pregnant with more than one baby.  Becoming pregnant using fertilization methods, such as IVF (in vitro fertilization).  Having other medical problems, such as diabetes, kidney disease, or lupus.  Having a family history of hypertension. What can I do to lower my risk? The exact cause of hypertension during pregnancy is not known. You may be able to lower your risk by:  Maintaining a healthy weight.  Eating a healthy and balanced diet.  Following your health care provider's instructions about treating any long-term conditions that you had before becoming pregnant. It is very important to keep all of your prenatal care appointments. Your health care provider will check your blood pressure and make sure that your pregnancy is  progressing as expected. If a problem is found, early treatment can prevent complications. How is this treated? Treatment for hypertension during pregnancy varies depending on the type of hypertension you have and how serious it is.  If you were taking medicine for high blood pressure before you became pregnant, talk with your health care provider. You may need to change medicine during pregnancy  because some medicines, like ACE inhibitors, may not be considered safe for your baby.  If you have gestational hypertension, your health care provider may order medicine to treat this during pregnancy.  If you are at risk for preeclampsia, your health care provider may recommend that you take a low-dose aspirin during your pregnancy.  If you have severe hypertension, you may need to be hospitalized so you and your baby can be monitored closely. You may also need to be given medicine to lower your blood pressure. This medicine may be given by mouth or through an IV.  In some cases, if your condition gets worse, you may need to deliver your baby early. Follow these instructions at home: Eating and drinking   Drink enough fluid to keep your urine pale yellow.  Avoid caffeine. Lifestyle  Do not use any products that contain nicotine or tobacco, such as cigarettes, e-cigarettes, and chewing tobacco. If you need help quitting, ask your health care provider.  Do not use alcohol or drugs.  Avoid stress as much as possible.  Rest and get plenty of sleep.  Regular exercise can help to reduce your blood pressure. Ask your health care provider what kinds of exercise are best for you. General instructions  Take over-the-counter and prescription medicines only as told by your health care provider.  Keep all prenatal and follow-up visits as told by your health care provider. This is important. Contact a health care provider if:  You have symptoms that your health care provider told you may require  more treatment or monitoring, such as: ? Headaches. ? Nausea or vomiting. ? Abdominal pain. ? Dizziness. ? Light-headedness. Get help right away if:  You have: ? Severe abdominal pain that does not get better with treatment. ? A severe headache that does not get better. ? Vomiting that does not get better. ? Sudden, rapid weight gain. ? Sudden swelling in your hands, ankles, or face. ? Vaginal bleeding. ? Blood in your urine. ? Blurred or double vision. ? Shortness of breath or chest pain. ? Weakness on one side of your body. ? Difficulty speaking.  Your baby is not moving as much as usual. Summary  High blood pressure (hypertension) is when the force of blood pumping through the arteries is too strong.  Hypertension during pregnancy can cause problems for you and your baby.  Treatment for hypertension during pregnancy varies depending on the type of hypertension you have and how serious it is.  Keep all prenatal and follow-up visits as told by your health care provider. This is important. This information is not intended to replace advice given to you by your health care provider. Make sure you discuss any questions you have with your health care provider. Document Released: 02/11/2011 Document Revised: 09/16/2018 Document Reviewed: 06/22/2018 Elsevier Patient Education  2020 [employer]

## 2018-12-12 LAB — HEMOGLOBIN A1C
Est. average glucose Bld gHb Est-mCnc: 117 mg/dL
Hgb A1c MFr Bld: 5.7 % — ABNORMAL HIGH (ref 4.8–5.6)

## 2018-12-12 LAB — OBSTETRIC PANEL, INCLUDING HIV
Antibody Screen: NEGATIVE
Basophils Absolute: 0.1 10*3/uL (ref 0.0–0.2)
Basos: 1 %
EOS (ABSOLUTE): 0.1 10*3/uL (ref 0.0–0.4)
Eos: 1 %
HIV Screen 4th Generation wRfx: NONREACTIVE
Hematocrit: 37.6 % (ref 34.0–46.6)
Hemoglobin: 12.3 g/dL (ref 11.1–15.9)
Hepatitis B Surface Ag: NEGATIVE
Immature Grans (Abs): 0 10*3/uL (ref 0.0–0.1)
Immature Granulocytes: 0 %
Lymphocytes Absolute: 1.6 10*3/uL (ref 0.7–3.1)
Lymphs: 27 %
MCH: 26.7 pg (ref 26.6–33.0)
MCHC: 32.7 g/dL (ref 31.5–35.7)
MCV: 82 fL (ref 79–97)
Monocytes Absolute: 0.4 10*3/uL (ref 0.1–0.9)
Monocytes: 6 %
Neutrophils Absolute: 3.7 10*3/uL (ref 1.4–7.0)
Neutrophils: 65 %
Platelets: 352 10*3/uL (ref 150–450)
RBC: 4.61 x10E6/uL (ref 3.77–5.28)
RDW: 14 % (ref 11.7–15.4)
RPR Ser Ql: NONREACTIVE
Rh Factor: POSITIVE
Rubella Antibodies, IGG: 4.91 index (ref >0.99)
WBC: 5.8 10*3/uL (ref 3.4–10.8)

## 2018-12-12 LAB — COMPREHENSIVE METABOLIC PANEL
ALT: 8 IU/L (ref 0–32)
AST: 12 IU/L (ref 0–40)
Albumin/Globulin Ratio: 1.6 (ref 1.2–2.2)
Albumin: 4.2 g/dL (ref 3.9–5.0)
Alkaline Phosphatase: 79 IU/L (ref 39–117)
BUN/Creatinine Ratio: 13 (ref 9–23)
BUN: 9 mg/dL (ref 6–20)
Bilirubin Total: 0.4 mg/dL (ref 0.0–1.2)
CO2: 23 mmol/L (ref 20–29)
Calcium: 9.8 mg/dL (ref 8.7–10.2)
Chloride: 101 mmol/L (ref 96–106)
Creatinine, Ser: 0.68 mg/dL (ref 0.57–1.00)
GFR calc Af Amer: 137 mL/min/{1.73_m2} (ref >59)
GFR calc non Af Amer: 119 mL/min/{1.73_m2} (ref >59)
Globulin, Total: 2.7 g/dL (ref 1.5–4.5)
Glucose: 106 mg/dL — ABNORMAL HIGH (ref 65–99)
Potassium: 4.2 mmol/L (ref 3.5–5.2)
Sodium: 138 mmol/L (ref 134–144)
Total Protein: 6.9 g/dL (ref 6.0–8.5)

## 2018-12-12 LAB — PROTEIN / CREATININE RATIO, URINE
Creatinine, Urine: 219.4 mg/dL
Protein, Ur: 16.5 mg/dL
Protein/Creat Ratio: 75 mg/g creat (ref 0–200)

## 2018-12-12 LAB — URINE CULTURE, OB REFLEX

## 2018-12-12 LAB — CULTURE, OB URINE

## 2018-12-12 LAB — TSH: TSH: 0.36 u[IU]/mL — ABNORMAL LOW (ref 0.450–4.500)

## 2018-12-13 DIAGNOSIS — R7989 Other specified abnormal findings of blood chemistry: Secondary | ICD-10-CM | POA: Insufficient documentation

## 2018-12-13 NOTE — Addendum Note (Signed)
Addended by: Verita Schneiders A on: 12/13/2018 08:14 AM   Modules accepted: Orders

## 2018-12-14 LAB — GC/CHLAMYDIA PROBE AMP (~~LOC~~) NOT AT ARMC
Chlamydia: NEGATIVE
Neisseria Gonorrhea: NEGATIVE

## 2018-12-14 LAB — T4, FREE: Free T4: 1.16 ng/dL (ref 0.82–1.77)

## 2018-12-14 LAB — T3, FREE: T3, Free: 2.6 pg/mL (ref 2.0–4.4)

## 2018-12-14 LAB — SPECIMEN STATUS REPORT

## 2018-12-18 DIAGNOSIS — O0990 Supervision of high risk pregnancy, unspecified, unspecified trimester: Secondary | ICD-10-CM | POA: Diagnosis not present

## 2018-12-19 DIAGNOSIS — I10 Essential (primary) hypertension: Secondary | ICD-10-CM | POA: Diagnosis not present

## 2018-12-19 DIAGNOSIS — O210 Mild hyperemesis gravidarum: Secondary | ICD-10-CM | POA: Diagnosis not present

## 2018-12-19 DIAGNOSIS — R0982 Postnasal drip: Secondary | ICD-10-CM | POA: Diagnosis not present

## 2018-12-19 DIAGNOSIS — Z3A10 10 weeks gestation of pregnancy: Secondary | ICD-10-CM | POA: Diagnosis not present

## 2018-12-19 DIAGNOSIS — R51 Headache: Secondary | ICD-10-CM | POA: Diagnosis not present

## 2018-12-19 DIAGNOSIS — R0602 Shortness of breath: Secondary | ICD-10-CM | POA: Diagnosis not present

## 2018-12-19 DIAGNOSIS — Z20828 Contact with and (suspected) exposure to other viral communicable diseases: Secondary | ICD-10-CM | POA: Diagnosis not present

## 2018-12-19 DIAGNOSIS — O9989 Other specified diseases and conditions complicating pregnancy, childbirth and the puerperium: Secondary | ICD-10-CM | POA: Diagnosis not present

## 2018-12-19 DIAGNOSIS — O26891 Other specified pregnancy related conditions, first trimester: Secondary | ICD-10-CM | POA: Diagnosis not present

## 2018-12-19 DIAGNOSIS — R05 Cough: Secondary | ICD-10-CM | POA: Diagnosis not present

## 2018-12-22 ENCOUNTER — Encounter: Payer: Federal, State, Local not specified - PPO | Admitting: Advanced Practice Midwife

## 2018-12-22 ENCOUNTER — Encounter: Payer: Self-pay | Admitting: Advanced Practice Midwife

## 2018-12-22 ENCOUNTER — Ambulatory Visit (INDEPENDENT_AMBULATORY_CARE_PROVIDER_SITE_OTHER): Payer: Federal, State, Local not specified - PPO | Admitting: Advanced Practice Midwife

## 2018-12-22 ENCOUNTER — Other Ambulatory Visit: Payer: Self-pay

## 2018-12-22 VITALS — BP 123/75 | HR 90 | Wt 212.0 lb

## 2018-12-22 DIAGNOSIS — O99211 Obesity complicating pregnancy, first trimester: Secondary | ICD-10-CM

## 2018-12-22 DIAGNOSIS — O219 Vomiting of pregnancy, unspecified: Secondary | ICD-10-CM

## 2018-12-22 DIAGNOSIS — Z3A11 11 weeks gestation of pregnancy: Secondary | ICD-10-CM

## 2018-12-22 DIAGNOSIS — O10919 Unspecified pre-existing hypertension complicating pregnancy, unspecified trimester: Secondary | ICD-10-CM

## 2018-12-22 DIAGNOSIS — O10911 Unspecified pre-existing hypertension complicating pregnancy, first trimester: Secondary | ICD-10-CM

## 2018-12-22 DIAGNOSIS — O0991 Supervision of high risk pregnancy, unspecified, first trimester: Secondary | ICD-10-CM

## 2018-12-22 DIAGNOSIS — O0990 Supervision of high risk pregnancy, unspecified, unspecified trimester: Secondary | ICD-10-CM

## 2018-12-22 NOTE — Progress Notes (Addendum)
   PRENATAL VISIT NOTE  Subjective:  April Davenport is a 28 y.o. G3P0011 at [redacted]w[redacted]d being seen today for ongoing prenatal care.  She is currently monitored for the following issues for this high-risk pregnancy and has Essential hypertension; History of kidney stones; Obesity, Class III, BMI 40-49.9 (morbid obesity) ([place]); Preexisting hypertension complicating pregnancy, antepartum; Supervision of high-risk pregnancy; History of low transverse cesarean section x 1; History of severe pre-eclampsia; Maternal morbid obesity, antepartum ([place]); Previous preterm delivery, antepartum; and Low TSH level on their problem list.  Patient reports nausea and vomiting.  She was previously prescribed B6 and Unisom for nausea but does not feel it is effective. 24 hour diet recall includes heavy protein and [place] food. Denies vaginal bleeding,abnormal vaginal discharge, fever, falls, or recent illness.   Patient was evaluated at [facility] for elevated blood pressure and nausea/vomiting. Patient reports her diastolic was elevated "at about 96". She endorses taking her blood pressure up to 5 times each day so that she can be responsive to any elevated readings.   The following portions of the patient's history were reviewed and updated as appropriate: allergies, current medications, past family history, past medical history, past social history, past surgical history and problem list. Problem list updated.  Objective:  There were no vitals filed for this visit.  Fetal Status:           General:  Alert, oriented and cooperative. Patient is in no acute distress.  Skin: Skin is warm and dry. No rash noted.   Cardiovascular: Normal heart rate noted  Respiratory: Normal respiratory effort, no problems with respiration noted  Abdomen: Soft, gravid, appropriate for gestational age.        Pelvic: Cervical exam deferred        Extremities: Normal range of motion.     Mental Status: Normal mood and affect. Normal behavior.  Normal judgment and thought content.   Assessment and Plan:  Pregnancy: G3P0011 at [redacted]w[redacted]d  1. Supervision of high risk pregnancy, antepartum - Continue routine care - Reviewed milestones and typical complaints associated with second trimester  2. Preexisting hypertension complicating pregnancy, antepartum - Daily ASA 81 mg to start at 12 weeks, previously prescribed - Discussed taking blood pressure once each week or when symptomatic including headache, blurry vision, dizziness - 120/81 at [facility], normotensive today per staff verbal report - Call clinic for BP > 140/90  3. Maternal morbid obesity, antepartum ([place])  4. Nausea and vomiting during pregnancy prior to [redacted] weeks gestation - Advised diet modifications including protein at each meal, small frequent snacks, bland diet  First trimester warning symptoms and general obstetric precautions including but not limited to vaginal bleeding, contractions, leaking of fluid and fetal movement were reviewed in detail with the patient. Please refer to After Visit Summary for other counseling recommendations.    Future Appointments  Date Time Provider [place]  12/30/2018  8:15 AM [facility]  01/04/2019 11:15 AM Caren Macadam, MD [facility]  02/17/2019  8:00 AM [place] MFC-US  02/17/2019  8:00 AM [place] 3 WH-MFCUS MFC-US    Mallie Snooks, MSN, CNM Certified Nurse Midwife, [employer] for [employer], [employer] [employer] 12/22/18 4:29 PM

## 2018-12-22 NOTE — Progress Notes (Signed)
Patient went to ER for vomiting and High blood pressure reading. Patient reports doing a little better.

## 2018-12-22 NOTE — Patient Instructions (Signed)

## 2018-12-23 NOTE — Progress Notes (Signed)
Heart rate was obtain

## 2018-12-30 ENCOUNTER — Other Ambulatory Visit: Payer: Self-pay

## 2018-12-30 ENCOUNTER — Other Ambulatory Visit: Payer: Federal, State, Local not specified - PPO

## 2018-12-30 DIAGNOSIS — Z3401 Encounter for supervision of normal first pregnancy, first trimester: Secondary | ICD-10-CM

## 2019-01-04 ENCOUNTER — Encounter: Payer: Self-pay | Admitting: Family Medicine

## 2019-01-04 ENCOUNTER — Telehealth (INDEPENDENT_AMBULATORY_CARE_PROVIDER_SITE_OTHER): Payer: Federal, State, Local not specified - PPO | Admitting: Family Medicine

## 2019-01-04 ENCOUNTER — Other Ambulatory Visit: Payer: Self-pay

## 2019-01-04 VITALS — HR 90 | Wt 210.0 lb

## 2019-01-04 DIAGNOSIS — Z3A12 12 weeks gestation of pregnancy: Secondary | ICD-10-CM

## 2019-01-04 DIAGNOSIS — O09219 Supervision of pregnancy with history of pre-term labor, unspecified trimester: Secondary | ICD-10-CM

## 2019-01-04 DIAGNOSIS — O10919 Unspecified pre-existing hypertension complicating pregnancy, unspecified trimester: Secondary | ICD-10-CM

## 2019-01-04 DIAGNOSIS — O10911 Unspecified pre-existing hypertension complicating pregnancy, first trimester: Secondary | ICD-10-CM

## 2019-01-04 DIAGNOSIS — O0990 Supervision of high risk pregnancy, unspecified, unspecified trimester: Secondary | ICD-10-CM

## 2019-01-04 DIAGNOSIS — O99211 Obesity complicating pregnancy, first trimester: Secondary | ICD-10-CM

## 2019-01-04 DIAGNOSIS — O99210 Obesity complicating pregnancy, unspecified trimester: Secondary | ICD-10-CM

## 2019-01-04 DIAGNOSIS — O09211 Supervision of pregnancy with history of pre-term labor, first trimester: Secondary | ICD-10-CM

## 2019-01-04 DIAGNOSIS — Z98891 History of uterine scar from previous surgery: Secondary | ICD-10-CM

## 2019-01-04 DIAGNOSIS — O0991 Supervision of high risk pregnancy, unspecified, first trimester: Secondary | ICD-10-CM

## 2019-01-04 NOTE — Progress Notes (Signed)
I connected with  April Davenport on 01/04/19 at 11:15 AM EDT by telephone and verified that I am speaking with the correct person using two identifiers.   I discussed the limitations, risks, security and privacy concerns of performing an evaluation and management service by telephone and the availability of in person appointments. I also discussed with the patient that there may be a patient responsible charge related to this service. The patient expressed understanding and agreed to proceed.  [name], CMA 01/04/2019  11:36 AMShe reports not taking prenatal vitamin B/P pressure yest 121/70

## 2019-01-04 NOTE — Progress Notes (Signed)
I connected with@ on 01/04/19 at 11:15 AM EDT by: Telephone and verified that I am speaking with the correct person using two identifiers.  Patient is located at [facility] and provider is located at [facility].     The purpose of this virtual visit is to provide medical care while limiting exposure to the novel coronavirus. I discussed the limitations, risks, security and privacy concerns of performing an evaluation and management service by telephone and the availability of in person appointments. I also discussed with the patient that there may be a patient responsible charge related to this service. By engaging in this virtual visit, you consent to the provision of healthcare.  Additionally, you authorize for your insurance to be billed for the services provided during this visit.  The patient expressed understanding and agreed to proceed.  The following staff members participated in the virtual visit:  Demetrice Phillip Heal    PRENATAL VISIT NOTE  Subjective:  April Davenport is a 28 y.o. G3P0011 at [redacted]w[redacted]d  for phone visit for ongoing prenatal care.  She is currently monitored for the following issues for this high-risk pregnancy and has Essential hypertension; History of kidney stones; Obesity, Class III, BMI 40-49.9 (morbid obesity) ([place]); Preexisting hypertension complicating pregnancy, antepartum; Supervision of high-risk pregnancy; History of low transverse cesarean section x 1; History of severe pre-eclampsia; Maternal morbid obesity, antepartum ([place]); Previous preterm delivery, antepartum; and Low TSH level on their problem list.  Patient reports no complaints.  Contractions: Not present. Vag. Bleeding: None.  Movement: Absent. Denies leaking of fluid.   The following portions of the patient's history were reviewed and updated as appropriate: allergies, current medications, past family history, past medical history, past social history, past surgical history and problem list.   Objective:   Vitals:   01/04/19 1134  Pulse: 90  Weight: 210 lb (95.3 kg)   Self-Obtained  Fetal Status:     Movement: Absent     Assessment and Plan:  Pregnancy: G3P0011 at [redacted]w[redacted]d 1. Preexisting hypertension complicating pregnancy, antepartum BP wnl today Recommended patient start ASA now  2. Supervision of high risk pregnancy, antepartum UTD  3. History of low transverse cesarean section x 1 Discuss TOLAC at future appts Patient has no general questions at this point  4. Maternal morbid obesity, antepartum (HCC) TWG= 5 lb (2.268 kg)   5. Previous preterm delivery, antepartum Delivery preterm due to preeclampsia, 17 P not indicated  Preterm labor symptoms and general obstetric precautions including but not limited to vaginal bleeding, contractions, leaking of fluid and fetal movement were reviewed in detail with the patient.  Return in about 4 weeks (around 02/01/2019) for Routine prenatal care, Telehealth/Virtual health OB Visit.  Future Appointments  Date Time Provider [place]  01/19/2019  8:00 AM Sheran Fava Max Sane, RD [place] NDM  02/17/2019  8:00 AM [place] MFC-US  02/17/2019  8:00 AM [place] 3 WH-MFCUS MFC-US     Time spent on virtual visit: 15 minutes  Caren Macadam, MD

## 2019-01-10 ENCOUNTER — Telehealth: Payer: Self-pay | Admitting: Radiology

## 2019-01-10 ENCOUNTER — Encounter: Payer: Self-pay | Admitting: Radiology

## 2019-01-10 NOTE — Telephone Encounter (Signed)
Patient informed of Panorama results  

## 2019-01-11 ENCOUNTER — Encounter: Payer: Self-pay | Admitting: Radiology

## 2019-01-11 ENCOUNTER — Telehealth: Payer: Self-pay | Admitting: Radiology

## 2019-01-11 NOTE — Telephone Encounter (Signed)
Patient informed of horizon results

## 2019-01-12 ENCOUNTER — Encounter: Payer: Self-pay | Admitting: Radiology

## 2019-01-13 ENCOUNTER — Encounter: Payer: Self-pay | Admitting: *Deleted

## 2019-01-13 ENCOUNTER — Encounter: Payer: Self-pay | Admitting: Radiology

## 2019-01-19 ENCOUNTER — Encounter: Payer: Federal, State, Local not specified - PPO | Attending: Obstetrics & Gynecology | Admitting: Dietician

## 2019-01-28 ENCOUNTER — Encounter (HOSPITAL_COMMUNITY): Payer: Self-pay

## 2019-01-28 ENCOUNTER — Other Ambulatory Visit: Payer: Self-pay

## 2019-01-28 ENCOUNTER — Inpatient Hospital Stay (HOSPITAL_COMMUNITY)
Admission: AD | Admit: 2019-01-28 | Discharge: 2019-01-28 | Disposition: A | Discharge status: Home / Self Care | Payer: Federal, State, Local not specified - PPO | Attending: Obstetrics and Gynecology | Admitting: Obstetrics and Gynecology

## 2019-01-28 DIAGNOSIS — O99210 Obesity complicating pregnancy, unspecified trimester: Secondary | ICD-10-CM

## 2019-01-28 DIAGNOSIS — O26892 Other specified pregnancy related conditions, second trimester: Secondary | ICD-10-CM | POA: Diagnosis not present

## 2019-01-28 DIAGNOSIS — Z885 Allergy status to narcotic agent status: Secondary | ICD-10-CM | POA: Insufficient documentation

## 2019-01-28 DIAGNOSIS — Z87442 Personal history of urinary calculi: Secondary | ICD-10-CM | POA: Insufficient documentation

## 2019-01-28 DIAGNOSIS — Z833 Family history of diabetes mellitus: Secondary | ICD-10-CM | POA: Diagnosis not present

## 2019-01-28 DIAGNOSIS — O99212 Obesity complicating pregnancy, second trimester: Secondary | ICD-10-CM

## 2019-01-28 DIAGNOSIS — Z3A16 16 weeks gestation of pregnancy: Secondary | ICD-10-CM | POA: Diagnosis not present

## 2019-01-28 DIAGNOSIS — R197 Diarrhea, unspecified: Secondary | ICD-10-CM | POA: Diagnosis not present

## 2019-01-28 DIAGNOSIS — Z8249 Family history of ischemic heart disease and other diseases of the circulatory system: Secondary | ICD-10-CM | POA: Insufficient documentation

## 2019-01-28 DIAGNOSIS — R1030 Lower abdominal pain, unspecified: Secondary | ICD-10-CM | POA: Insufficient documentation

## 2019-01-28 DIAGNOSIS — Z7982 Long term (current) use of aspirin: Secondary | ICD-10-CM | POA: Diagnosis not present

## 2019-01-28 LAB — URINALYSIS, ROUTINE W REFLEX MICROSCOPIC
Bacteria, UA: NONE SEEN
Bilirubin Urine: NEGATIVE
Glucose, UA: NEGATIVE mg/dL
Ketones, ur: 80 mg/dL — AB
Leukocytes,Ua: NEGATIVE
Nitrite: NEGATIVE
Protein, ur: NEGATIVE mg/dL
Specific Gravity, Urine: 1.017 (ref 1.005–1.030)
pH: 6 (ref 5.0–8.0)

## 2019-01-28 MED ORDER — PANTOPRAZOLE SODIUM 40 MG IV SOLR
40.0000 mg | Freq: Once | INTRAVENOUS | Status: AC
  Administered 2019-01-28: 40 mg via INTRAVENOUS
  Filled 2019-01-28: qty 40

## 2019-01-28 MED ORDER — LACTATED RINGERS IV BOLUS
1000.0000 mL | Freq: Once | INTRAVENOUS | Status: AC
  Administered 2019-01-28: 1000 mL via INTRAVENOUS

## 2019-01-28 MED ORDER — ONDANSETRON HCL 4 MG/2ML IJ SOLN
4.0000 mg | Freq: Once | INTRAMUSCULAR | Status: AC
  Administered 2019-01-28: 4 mg via INTRAVENOUS
  Filled 2019-01-28: qty 2

## 2019-01-28 MED ORDER — LOPERAMIDE HCL 2 MG PO CAPS: 2.0000 mg | ORAL_CAPSULE | Freq: Four times a day (QID) | ORAL | 0 refills | Status: DC | PRN

## 2019-01-28 MED ORDER — LOPERAMIDE HCL 2 MG PO CAPS
4.0000 mg | ORAL_CAPSULE | Freq: Once | ORAL | Status: AC
  Administered 2019-01-28: 4 mg via ORAL
  Filled 2019-01-28: qty 2

## 2019-01-28 NOTE — MAU Provider Note (Signed)
History     CSN: [id number]  Arrival date and time: 01/28/19 [id number]   Chief Complaint  Patient presents with  . Diarrhea   April Davenport is a 28 year old G3P0111 at 7679w2d who presents to the maternity assessment unit today for diarrhea. She endorses mild lower abdominal cramps, but denies any vaginal discharge or bleeding. Denies any urinary symptoms such as burning or urinary frequency.  Diarrhea  This is a new problem. Episode onset: Monday. The problem occurs more than 10 times per day. The problem has been gradually worsening. The stool consistency is described as watery. The patient states that diarrhea awakens her from sleep. Associated symptoms include abdominal pain, headaches and myalgias. Pertinent negatives include no fever. Associated symptoms comments: Dry heaving, mild shortness of breath. Nothing aggravates the symptoms. There are no known risk factors. She has tried increased fluids (Drinking 10-12 bottles of water a day) for the symptoms.     OB History    Gravida  3   Para  1   Term      Preterm  1   AB  1   Living  1     SAB  1   TAB      Ectopic      Multiple      Live Births  1           Past Medical History:  Diagnosis Date  . Hypertension   . Right nephrolithiasis     Past Surgical History:  Procedure Laterality Date  . CESAREAN SECTION  05/16/2013  . KNEE SURGERY Right     Family History  Problem Relation Age of Onset  . Diabetes Mother   . Heart attack Father   . Diabetes Paternal Grandfather     Social History   Tobacco Use  . Smoking status: Never Smoker  . Smokeless tobacco: Never Used  Substance Use Topics  . Alcohol use: Yes    Frequency: Never    Comment: rare  . Drug use: Never    Allergies:  Allergies  Allergen Reactions  . Dilaudid [Hydromorphone Hcl] Hives, Shortness Of Breath and Itching    Syncope  . Black [employer] Flavor Hives and Swelling    Medications Prior to Admission  Medication Sig  Dispense Refill Last Dose  . acetaminophen (TYLENOL) 325 MG tablet Take 650 mg by mouth every 6 (six) hours as needed.   Past Week at Unknown time  . aspirin EC 81 MG tablet Take 1 tablet (81 mg total) by mouth daily. Take after 12 weeks for prevention of preeclampsia later in pregnancy 300 tablet 2 Past Week at Unknown time  . Blood Pressure Monitoring (BLOOD PRESSURE CUFF) MISC 1 Device by Does not apply route once a week. To be monitored weekly 1 each 0 Past Week at Unknown time  . doxylamine, Sleep, (UNISOM) 25 MG tablet Take 25 mg by mouth at bedtime as needed.   Past Month at Unknown time  . ibuprofen (ADVIL,MOTRIN) 200 MG tablet Take 600 mg by mouth every 6 (six) hours as needed for moderate pain.   Past Month at Unknown time  . Prenatal Vit-Fe Fumarate-FA (PRENATAL MULTIVITAMIN) TABS tablet Take 1 tablet by mouth daily at 12 noon.   Past Week at Unknown time  . pyridOXINE (VITAMIN B-6) 100 MG tablet Take 100 mg by mouth daily.       Review of Systems  Constitutional: Negative for fever.  Respiratory: Positive for shortness of breath.  Cardiovascular: Negative for chest pain and leg swelling.  Gastrointestinal: Positive for abdominal pain, diarrhea and nausea.  Musculoskeletal: Positive for myalgias.  Neurological: Positive for headaches.   Physical Exam   Blood pressure 135/89, pulse 88, temperature 98 F (36.7 C), temperature source Oral, resp. rate 18, height 5\' 1"  (1.549 m), weight 95.8 kg, last menstrual period 10/06/2018, SpO2 100 %.  Physical Exam  Vitals reviewed. Constitutional: She is oriented to person, place, and time. She appears well-developed and well-nourished.  Respiratory: Effort normal.  GI: Soft. There is abdominal tenderness.  Musculoskeletal: Normal range of motion.        General: No edema.  Neurological: She is alert and oriented to person, place, and time.  Skin: Skin is warm and dry.    MAU Course    MDM Physical exam completed. IV was started  with LR Bolus, Protonix, and Zofran. Patient was also given Imodium PO. She reports feeling better after these interventions. Patient was informed to return to MAU if symptoms worsen.   Assessment and Plan  1. Diarrhea -Patient informed to continue increased fluid intake, continue bland diet -Prescription sent for Imodium   2. Management of Pregnancy -Encouraged to follow up with outpatient OBGYN provider [facility], next appointment next Tuesday 02/08/2019)  April Davenport 01/28/2019, 8:48 AM

## 2019-01-28 NOTE — Discharge Instructions (Signed)
Bland Diet A bland diet consists of foods that are often soft and do not have a lot of fat, fiber, or extra seasonings. Foods without fat, fiber, or seasoning are easier for the body to digest. They are also less likely to irritate your mouth, throat, stomach, and other parts of your digestive system. A bland diet is sometimes called a BRAT diet. What is my plan? Your health care provider or food and nutrition specialist (dietitian) may recommend specific changes to your diet to prevent symptoms or to treat your symptoms. These changes may include:  Eating small meals often.  Cooking food until it is soft enough to chew easily.  Chewing your food well.  Drinking fluids slowly.  Not eating foods that are very spicy, sour, or fatty.  Not eating citrus fruits, such as oranges and grapefruit. What do I need to know about this diet?  Eat a variety of foods from the bland diet food list.  Do not follow a bland diet longer than needed.  Ask your health care provider whether you should take vitamins or supplements. What foods can I eat? Grains  Hot cereals, such as cream of wheat. Rice. Bread, crackers, or tortillas made from refined white flour. Vegetables Canned or cooked vegetables. Mashed or boiled potatoes. Fruits  Bananas. Applesauce. Other types of cooked or canned fruit with the skin and seeds removed, such as canned peaches or pears. Meats and other proteins  Scrambled eggs. Creamy peanut butter or other nut butters. Lean, well-cooked meats, such as chicken or fish. Tofu. Soups or broths. Dairy Low-fat dairy products, such as milk, cottage cheese, or yogurt. Beverages  Water. Herbal tea. Apple juice. Fats and oils Mild salad dressings. Canola or olive oil. Sweets and desserts Pudding. Custard. Fruit gelatin. Ice cream. The items listed above may not be a complete list of recommended foods and beverages. Contact a dietitian for more options. What foods are not  recommended? Grains Whole grain breads and cereals. Vegetables Raw vegetables. Fruits Raw fruits, especially citrus, berries, or dried fruits. Dairy Whole fat dairy foods. Beverages Caffeinated drinks. Alcohol. Seasonings and condiments Strongly flavored seasonings or condiments. Hot sauce. Salsa. Other foods Spicy foods. Fried foods. Sour foods, such as pickled or fermented foods. Foods with high sugar content. Foods high in fiber. The items listed above may not be a complete list of foods and beverages to avoid. Contact a dietitian for more information. Summary  A bland diet consists of foods that are often soft and do not have a lot of fat, fiber, or extra seasonings.  Foods without fat, fiber, or seasoning are easier for the body to digest.  Check with your health care provider to see how long you should follow this diet plan. It is not meant to be followed for long periods. This information is not intended to replace advice given to you by your health care provider. Make sure you discuss any questions you have with your health care provider. Document Released: 09/17/2015 Document Revised: 06/24/2017 Document Reviewed: 06/24/2017 Elsevier Patient Education  2020 Elsevier Inc.  

## 2019-01-28 NOTE — MAU Note (Signed)
Pt states since Monday she has had diarrhea since Monday, wednesday night was constant every 10-15 mins, pt states other than that she feels fine.

## 2019-01-28 NOTE — MAU Note (Signed)
Pt also states she has been watching her blood pressure that has been high, she said she also has a headache slightly right now.

## 2019-02-01 ENCOUNTER — Telehealth (INDEPENDENT_AMBULATORY_CARE_PROVIDER_SITE_OTHER): Payer: Federal, State, Local not specified - PPO | Admitting: Obstetrics and Gynecology

## 2019-02-01 ENCOUNTER — Other Ambulatory Visit: Payer: Self-pay

## 2019-02-01 VITALS — Wt 207.0 lb

## 2019-02-01 DIAGNOSIS — O10912 Unspecified pre-existing hypertension complicating pregnancy, second trimester: Secondary | ICD-10-CM

## 2019-02-01 DIAGNOSIS — O0992 Supervision of high risk pregnancy, unspecified, second trimester: Secondary | ICD-10-CM

## 2019-02-01 DIAGNOSIS — O34211 Maternal care for low transverse scar from previous cesarean delivery: Secondary | ICD-10-CM

## 2019-02-01 DIAGNOSIS — Z8759 Personal history of other complications of pregnancy, childbirth and the puerperium: Secondary | ICD-10-CM

## 2019-02-01 DIAGNOSIS — O09292 Supervision of pregnancy with other poor reproductive or obstetric history, second trimester: Secondary | ICD-10-CM

## 2019-02-01 DIAGNOSIS — Z3A16 16 weeks gestation of pregnancy: Secondary | ICD-10-CM

## 2019-02-01 DIAGNOSIS — O99210 Obesity complicating pregnancy, unspecified trimester: Secondary | ICD-10-CM

## 2019-02-01 DIAGNOSIS — Z98891 History of uterine scar from previous surgery: Secondary | ICD-10-CM

## 2019-02-01 DIAGNOSIS — O99212 Obesity complicating pregnancy, second trimester: Secondary | ICD-10-CM

## 2019-02-01 DIAGNOSIS — O10919 Unspecified pre-existing hypertension complicating pregnancy, unspecified trimester: Secondary | ICD-10-CM

## 2019-02-01 NOTE — Progress Notes (Signed)
   TELEHEALTH VIRTUAL OBSTETRICS VISIT ENCOUNTER NOTE  [facility]inic[facility]-  I connected with April Davenport on 02/01/19 at  9:00 AM EDT by telephone at home and verified that I am speaking with the correct person using two identifiers.   I discussed the limitations, risks, security and privacy concerns of performing an evaluation and management service by telephone and the availability of in person appointments. I also discussed with the patient that there may be a patient responsible charge related to this service. The patient expressed understanding and agreed to proceed.  Subjective:  April Davenport is a 28 y.o. [id number] at [redacted]w[redacted]d being followed for ongoing prenatal care.  She is currently monitored for the following issues for this high-risk pregnancy and has Essential hypertension; History of kidney stones; Obesity, Class III, BMI 40-49.9 (morbid obesity) ([place]); Preexisting hypertension complicating pregnancy, antepartum; Supervision of high-risk pregnancy; History of low transverse cesarean section x 1; History of severe pre-eclampsia; Maternal morbid obesity, antepartum ([place]); Previous preterm delivery, antepartum; and Low TSH level on their problem list.  Patient reports no complaints. Reports fetal movement. Denies any contractions, bleeding or leaking of fluid.   The following portions of the patient's history were reviewed and updated as appropriate: allergies, current medications, past family history, past medical history, past social history, past surgical history and problem list.   Objective:   Vitals:   02/01/19 0904  Weight: 207 lb (93.9 kg)    Babyscripts Data Reviewed: yes  General:  Alert, oriented and cooperative.   Mental Status: Normal mood and affect perceived. Normal judgment and thought content.  Rest of physical exam deferred due to type of encounter  Assessment and Plan:  Pregnancy: [id number] at [redacted]w[redacted]d 1. Maternal morbid obesity, antepartum  ([place]) Continue to follow  2. Preexisting hypertension complicating pregnancy, antepartum Doing well just on low dose ASA  3. Supervision of high risk pregnancy in second trimester Offer afp at anatomy u/s Anatomy u/s already set up  4. History of low transverse cesarean section x 1 D/w her re: delivery options later in pregnancy  5. History of severe pre-eclampsia See above  Preterm labor symptoms and general obstetric precautions including but not limited to vaginal bleeding, contractions, leaking of fluid and fetal movement were reviewed in detail with the patient.  I discussed the assessment and treatment plan with the patient. The patient was provided an opportunity to ask questions and all were answered. The patient agreed with the plan and demonstrated an understanding of the instructions. The patient was advised to call back or seek an in-person office evaluation/go to MAU at [facility] for any urgent or concerning symptoms. Please refer to After Visit Summary for other counseling recommendations.   I provided 10 minutes of non-face-to-face time during this encounter. The visit was conducted via MyChart-medicine  Return in about 3 weeks (around 02/22/2019) for high risk, virtual visit.  Future Appointments  Date Time Provider [place]  02/17/2019  8:00 AM [place] MFC-US  02/17/2019  8:00 AM WH-MFC [place] 3 WH-MFCUS MFC-US  03/01/2019  4:00 PM Anyanwu, Sallyanne Havers, MD [facility]    Aletha Halim, MD Center for [facility], [place]

## 2019-02-17 ENCOUNTER — Ambulatory Visit (HOSPITAL_COMMUNITY)
Admission: RE | Admit: 2019-02-17 | Discharge: 2019-02-17 | Disposition: A | Discharge status: Home / Self Care | Payer: Federal, State, Local not specified - PPO | Source: Ambulatory Visit | Attending: Obstetrics and Gynecology | Admitting: Obstetrics and Gynecology

## 2019-02-17 ENCOUNTER — Other Ambulatory Visit: Payer: Self-pay

## 2019-02-17 ENCOUNTER — Encounter (HOSPITAL_COMMUNITY): Payer: Self-pay

## 2019-02-17 ENCOUNTER — Ambulatory Visit (HOSPITAL_COMMUNITY): Payer: Federal, State, Local not specified - PPO | Admitting: *Deleted

## 2019-02-17 ENCOUNTER — Other Ambulatory Visit (HOSPITAL_COMMUNITY): Payer: Self-pay | Admitting: *Deleted

## 2019-02-17 VITALS — BP 120/81 | HR 87 | Temp 98.8°F

## 2019-02-17 DIAGNOSIS — O0990 Supervision of high risk pregnancy, unspecified, unspecified trimester: Secondary | ICD-10-CM

## 2019-02-17 DIAGNOSIS — O99210 Obesity complicating pregnancy, unspecified trimester: Secondary | ICD-10-CM | POA: Diagnosis not present

## 2019-02-17 DIAGNOSIS — O34219 Maternal care for unspecified type scar from previous cesarean delivery: Secondary | ICD-10-CM | POA: Diagnosis not present

## 2019-02-17 DIAGNOSIS — O10919 Unspecified pre-existing hypertension complicating pregnancy, unspecified trimester: Secondary | ICD-10-CM | POA: Insufficient documentation

## 2019-02-17 DIAGNOSIS — O09212 Supervision of pregnancy with history of pre-term labor, second trimester: Secondary | ICD-10-CM | POA: Diagnosis not present

## 2019-02-17 DIAGNOSIS — Z3A19 19 weeks gestation of pregnancy: Secondary | ICD-10-CM

## 2019-02-17 DIAGNOSIS — O10012 Pre-existing essential hypertension complicating pregnancy, second trimester: Secondary | ICD-10-CM

## 2019-02-17 DIAGNOSIS — O10912 Unspecified pre-existing hypertension complicating pregnancy, second trimester: Secondary | ICD-10-CM

## 2019-02-17 DIAGNOSIS — O99212 Obesity complicating pregnancy, second trimester: Secondary | ICD-10-CM

## 2019-02-17 DIAGNOSIS — O09292 Supervision of pregnancy with other poor reproductive or obstetric history, second trimester: Secondary | ICD-10-CM

## 2019-02-18 ENCOUNTER — Encounter: Payer: Self-pay | Admitting: Obstetrics & Gynecology

## 2019-02-18 DIAGNOSIS — IMO0002 Reserved for concepts with insufficient information to code with codable children: Secondary | ICD-10-CM | POA: Insufficient documentation

## 2019-02-18 DIAGNOSIS — O351XX0 Maternal care for (suspected) chromosomal abnormality in fetus, not applicable or unspecified: Secondary | ICD-10-CM | POA: Insufficient documentation

## 2019-03-01 ENCOUNTER — Encounter: Payer: Self-pay | Admitting: Obstetrics & Gynecology

## 2019-03-01 ENCOUNTER — Ambulatory Visit (INDEPENDENT_AMBULATORY_CARE_PROVIDER_SITE_OTHER): Payer: Federal, State, Local not specified - PPO | Admitting: Obstetrics & Gynecology

## 2019-03-01 ENCOUNTER — Other Ambulatory Visit: Payer: Self-pay

## 2019-03-01 VITALS — BP 124/85 | HR 98 | Wt 220.0 lb

## 2019-03-01 DIAGNOSIS — IMO0002 Reserved for concepts with insufficient information to code with codable children: Secondary | ICD-10-CM

## 2019-03-01 DIAGNOSIS — O351XX0 Maternal care for (suspected) chromosomal abnormality in fetus, not applicable or unspecified: Secondary | ICD-10-CM

## 2019-03-01 DIAGNOSIS — Z23 Encounter for immunization: Secondary | ICD-10-CM | POA: Diagnosis not present

## 2019-03-01 DIAGNOSIS — Z3A20 20 weeks gestation of pregnancy: Secondary | ICD-10-CM

## 2019-03-01 DIAGNOSIS — O0992 Supervision of high risk pregnancy, unspecified, second trimester: Secondary | ICD-10-CM | POA: Diagnosis not present

## 2019-03-01 DIAGNOSIS — O10919 Unspecified pre-existing hypertension complicating pregnancy, unspecified trimester: Secondary | ICD-10-CM

## 2019-03-01 DIAGNOSIS — O10912 Unspecified pre-existing hypertension complicating pregnancy, second trimester: Secondary | ICD-10-CM

## 2019-03-01 DIAGNOSIS — Z8759 Personal history of other complications of pregnancy, childbirth and the puerperium: Secondary | ICD-10-CM

## 2019-03-01 NOTE — Progress Notes (Signed)
   PRENATAL VISIT NOTE  Subjective:  April Davenport is a 28 y.o. [phone] at [redacted]w[redacted]d being seen today for ongoing prenatal care.  She is currently monitored for the following issues for this high-risk pregnancy and has Essential hypertension; History of kidney stones; Obesity, Class III, BMI 40-49.9 (morbid obesity) ([place]); Preexisting hypertension complicating pregnancy, antepartum; Supervision of high-risk pregnancy; History of low transverse cesarean section x 1; History of severe pre-eclampsia; Maternal morbid obesity, antepartum ([place]); Previous preterm delivery, antepartum; Low TSH level; and Thickening of nuchal fold of fetus on their problem list.  Patient reports no complaints.  Contractions: Not present. Vag. Bleeding: None.  Movement: Present. Denies leaking of fluid.   The following portions of the patient's history were reviewed and updated as appropriate: allergies, current medications, past family history, past medical history, past social history, past surgical history and problem list.   Objective:   Vitals:   03/01/19 1607  BP: 124/85  Pulse: 98  Weight: 220 lb (99.8 kg)    Fetal Status: Fetal Heart Rate (bpm): 144   Movement: Present     General:  Alert, oriented and cooperative. Patient is in no acute distress.  Skin: Skin is warm and dry. No rash noted.   Cardiovascular: Normal heart rate noted  Respiratory: Normal respiratory effort, no problems with respiration noted  Abdomen: Soft, gravid, appropriate for gestational age.  Pain/Pressure: Present     Pelvic: Cervical exam deferred        Extremities: Normal range of motion.  Edema: None  Mental Status: Normal mood and affect. Normal behavior. Normal judgment and thought content.   Assessment and Plan:  Pregnancy: [id number] at [redacted]w[redacted]d 1. Preexisting hypertension complicating pregnancy, antepartum 2. History of severe pre-eclampsia Stable BP. Continue ASA.  3. Thickening of nuchal fold of fetus Discussed this finding,  she declined amniocentesis. Fetal ECHO and follow up scans as per MFM, will follow up recommendations. - AFP, Serum, Open Spina Bifida  4. Supervision of high risk pregnancy in second trimester Low risk NIPS. - AFP, Serum, Open Spina Bifida Preterm labor symptoms and general obstetric precautions including but not limited to vaginal bleeding, contractions, leaking of fluid and fetal movement were reviewed in detail with the patient. Please refer to After Visit Summary for other counseling recommendations.   Return in about 4 weeks (around 03/29/2019) for Virtual OB Visit   8 weeks: 2 hr GTT, 3rd tri labs, TDap, OFFICE OB Visit, sign papers (TOLAC/BTL).  Future Appointments  Date Time Provider [place]  03/17/2019  8:00 AM [place] MFC-US  03/17/2019  8:00 AM [facility]-MFC [place] 3 WH-MFCUS MFC-US    Verita Schneiders, MD

## 2019-03-01 NOTE — Patient Instructions (Addendum)
Return to office for any scheduled appointments. Call the office or go to the MAU at [facility] if:  You begin to have strong, frequent contractions  Your water breaks.  Sometimes it is a big gush of fluid, sometimes it is just a trickle that keeps getting your panties wet or running down your legs  You have vaginal bleeding.  It is normal to have a small amount of spotting if your cervix was checked.   You do not feel your baby moving like normal.  If you do not, get something to eat and drink and lay down and focus on feeling your baby move.   If your baby is still not moving like normal, you should call the office or go to MAU.  Any other obstetric concerns.   Second Trimester of Pregnancy The second trimester is from week 14 through week 27 (months 4 through 6). The second trimester is often a time when you feel your best. Your body has adjusted to being pregnant, and you begin to feel better physically. Usually, morning sickness has lessened or quit completely, you may have more energy, and you may have an increase in appetite. The second trimester is also a time when the fetus is growing rapidly. At the end of the sixth month, the fetus is about 9 inches long and weighs about 1 pounds. You will likely begin to feel the baby move (quickening) between 16 and 20 weeks of pregnancy. Body changes during your second trimester Your body continues to go through many changes during your second trimester. The changes vary from woman to woman.  Your weight will continue to increase. You will notice your lower abdomen bulging out.  You may begin to get stretch marks on your hips, abdomen, and breasts.  You may develop headaches that can be relieved by medicines. The medicines should be approved by your health care provider.  You may urinate more often because the fetus is pressing on your bladder.  You may develop or continue to have heartburn as a result of your  pregnancy.  You may develop constipation because certain hormones are causing the muscles that push waste through your intestines to slow down.  You may develop hemorrhoids or swollen, bulging veins (varicose veins).  You may have back pain. This is caused by: ? Weight gain. ? Pregnancy hormones that are relaxing the joints in your pelvis. ? A shift in weight and the muscles that support your balance.  Your breasts will continue to grow and they will continue to become tender.  Your gums may bleed and may be sensitive to brushing and flossing.  Dark spots or blotches (chloasma, mask of pregnancy) may develop on your face. This will likely fade after the baby is born.  A dark line from your belly button to the pubic area (linea nigra) may appear. This will likely fade after the baby is born.  You may have changes in your hair. These can include thickening of your hair, rapid growth, and changes in texture. Some women also have hair loss during or after pregnancy, or hair that feels dry or thin. Your hair will most likely return to normal after your baby is born. What to expect at prenatal visits During a routine prenatal visit:  You will be weighed to make sure you and the fetus are growing normally.  Your blood pressure will be taken.  Your abdomen will be measured to track your baby's growth.    The fetal heartbeat will be listened to.  Any test results from the previous visit will be discussed. Your health care provider may ask you:  How you are feeling.  If you are feeling the baby move.  If you have had any abnormal symptoms, such as leaking fluid, bleeding, severe headaches, or abdominal cramping.  If you are using any tobacco products, including cigarettes, chewing tobacco, and electronic cigarettes.  If you have any questions. Other tests that may be performed during your second trimester include:  Blood tests that check for: ? Low iron levels (anemia). ? High  blood sugar that affects pregnant women (gestational diabetes) between 24 and 28 weeks. ? Rh antibodies. This is to check for a protein on red blood cells (Rh factor).  Urine tests to check for infections, diabetes, or protein in the urine.  An ultrasound to confirm the proper growth and development of the baby.  An amniocentesis to check for possible genetic problems.  Fetal screens for spina bifida and Down syndrome.  HIV (human immunodeficiency virus) testing. Routine prenatal testing includes screening for HIV, unless you choose not to have this test. Follow these instructions at home: Medicines  Follow your health care provider's instructions regarding medicine use. Specific medicines may be either safe or unsafe to take during pregnancy.  Take a prenatal vitamin that contains at least 600 micrograms (mcg) of folic acid.  If you develop constipation, try taking a stool softener if your health care provider approves. Eating and drinking   Eat a balanced diet that includes fresh fruits and vegetables, whole grains, good sources of protein such as meat, eggs, or tofu, and low-fat dairy. Your health care provider will help you determine the amount of weight gain that is right for you.  Avoid raw meat and uncooked cheese. These carry germs that can cause birth defects in the baby.  If you have low calcium intake from food, talk to your health care provider about whether you should take a daily calcium supplement.  Limit foods that are high in fat and processed sugars, such as fried and sweet foods.  To prevent constipation: ? Drink enough fluid to keep your urine clear or pale yellow. ? Eat foods that are high in fiber, such as fresh fruits and vegetables, whole grains, and beans. Activity  Exercise only as directed by your health care provider. Most women can continue their usual exercise routine during pregnancy. Try to exercise for 30 minutes at least 5 days a week. Stop  exercising if you experience uterine contractions.  Avoid heavy lifting, wear low heel shoes, and practice good posture.  A sexual relationship may be continued unless your health care provider directs you otherwise. Relieving pain and discomfort  Wear a good support bra to prevent discomfort from breast tenderness.  Take warm sitz baths to soothe any pain or discomfort caused by hemorrhoids. Use hemorrhoid cream if your health care provider approves.  Rest with your legs elevated if you have leg cramps or low back pain.  If you develop varicose veins, wear support hose. Elevate your feet for 15 minutes, 3-4 times a day. Limit salt in your diet. Prenatal Care  Write down your questions. Take them to your prenatal visits.  Keep all your prenatal visits as told by your health care provider. This is important. Safety  Wear your seat belt at all times when driving.  Make a list of emergency phone numbers, including numbers for family, friends, the hospital, and police and   Public relations account executive. General instructions  Ask your health care provider for a referral to a local prenatal education class. Begin classes no later than the beginning of month 6 of your pregnancy.  Ask for help if you have counseling or nutritional needs during pregnancy. Your health care provider can offer advice or refer you to specialists for help with various needs.  Do not use hot tubs, steam rooms, or saunas.  Do not douche or use tampons or scented sanitary pads.  Do not cross your legs for long periods of time.  Avoid cat litter boxes and soil used by cats. These carry germs that can cause birth defects in the baby and possibly loss of the fetus by miscarriage or stillbirth.  Avoid all smoking, herbs, alcohol, and unprescribed drugs. Chemicals in these products can affect the formation and growth of the baby.  Do not use any products that contain nicotine or tobacco, such as cigarettes and e-cigarettes. If  you need help quitting, ask your health care provider.  Visit your dentist if you have not gone yet during your pregnancy. Use a soft toothbrush to brush your teeth and be gentle when you floss. Contact a health care provider if:  You have dizziness.  You have mild pelvic cramps, pelvic pressure, or nagging pain in the abdominal area.  You have persistent nausea, vomiting, or diarrhea.  You have a bad smelling vaginal discharge.  You have pain when you urinate. Get help right away if:  You have a fever.  You are leaking fluid from your vagina.  You have spotting or bleeding from your vagina.  You have severe abdominal cramping or pain.  You have rapid weight gain or weight loss.  You have shortness of breath with chest pain.  You notice sudden or extreme swelling of your face, hands, ankles, feet, or legs.  You have not felt your baby move in over an hour.  You have severe headaches that do not go away when you take medicine.  You have vision changes. Summary  The second trimester is from week 14 through week 27 (months 4 through 6). It is also a time when the fetus is growing rapidly.  Your body goes through many changes during pregnancy. The changes vary from woman to woman.  Avoid all smoking, herbs, alcohol, and unprescribed drugs. These chemicals affect the formation and growth your baby.  Do not use any tobacco products, such as cigarettes, chewing tobacco, and e-cigarettes. If you need help quitting, ask your health care provider.  Contact your health care provider if you have any questions. Keep all prenatal visits as told by your health care provider. This is important. This information is not intended to replace advice given to you by your health care provider. Make sure you discuss any questions you have with your health care provider. Document Released: 05/20/2001 Document Revised: 09/17/2018 Document Reviewed: 07/01/2016 Elsevier Patient Education  2020  [employer].    Vaginal Birth After Cesarean Delivery  Vaginal birth after cesarean delivery (VBAC) is giving birth vaginally after previously delivering a baby through a cesarean section (C-section). A VBAC may be a safe option for you, depending on your health and other factors. It is important to discuss VBAC with your health care provider early in your pregnancy so you can understand the risks, benefits, and options. Having these discussions early will give you time to make your birth plan. Who are the best candidates for VBAC? The best candidates for VBAC are women  who:  Have had one or two prior cesarean deliveries, and the incision made during the delivery was horizontal (low transverse).  Do not have a vertical (classical) scar on their uterus.  Have not had a tear in the wall of their uterus (uterine rupture).  Plan to have more pregnancies. A VBAC is also more likely to be successful:  In women who have previously given birth vaginally.  When labor starts by itself (spontaneously) before the due date. What are the benefits of VBAC? The benefits of delivering your baby vaginally instead of by a cesarean delivery include:  A shorter hospital stay.  A faster recovery time.  Less pain.  Avoiding risks associated with major surgery, such as infection and blood clots.  Less blood loss and less need for donated blood (transfusions). What are the risks of VBAC? The main risk of attempting a VBAC is that it may fail, forcing your health care provider to deliver your baby by a C-section. Other risks are rare and include:  Tearing (rupture) of the scar from a past cesarean delivery.  Other risks associated with vaginal deliveries. If a repeat cesarean delivery is needed, the risks include:  Blood loss.  Infection.  Blood clot.  Damage to surrounding organs.  Removal of the uterus (hysterectomy), if it is damaged.  Placenta problems in future pregnancies. What  else should I know about my options? Delivering a baby through a VBAC is similar to having a normal spontaneous vaginal delivery. Therefore, it is safe:  To try with twins.  For your health care provider to try to turn the baby from a breech position (external cephalic version) during labor.  With epidural analgesia for pain relief. Consider where you would like to deliver your baby. VBAC should be attempted in facilities where an emergency cesarean delivery can be performed. VBAC is not recommended for home births. Any changes in your health or your baby's health during your pregnancy may make it necessary to change your initial decision about VBAC. Your health care provider may recommend that you do not attempt a VBAC if:  Your baby's suspected weight is 8.8 lb (4 kg) or more.  You have preeclampsia. This is a condition that causes high blood pressure along with other symptoms, such as swelling and headaches.  You will have VBAC less than 19 months after your cesarean delivery.  You are past your due date.  You need to have labor started (induced) because your cervix is not ready for labor (unfavorable). Where to find more information  American Pregnancy Association: americanpregnancy.org  [employer]: acog.org Summary  Vaginal birth after cesarean delivery (VBAC) is giving birth vaginally after previously delivering a baby through a cesarean section (C-section). A VBAC may be a safe option for you, depending on your health and other factors.  Discuss VBAC with your health care provider early in your pregnancy so you can understand the risks, benefits, options, and have plenty of time to make your birth plan.  The main risk of attempting a VBAC is that it may fail, forcing your health care provider to deliver your baby by a C-section. Other risks are rare. This information is not intended to replace advice given to you by your health care  provider. Make sure you discuss any questions you have with your health care provider. Document Released: 11/16/2006 Document Revised: 09/21/2018 Document Reviewed: 09/02/2016 Elsevier Patient Education  2020 [employer].

## 2019-03-04 LAB — AFP, SERUM, OPEN SPINA BIFIDA
AFP MoM: 1.96
AFP Value: 109.5 ng/mL
Gest. Age on Collection Date: 20.9 weeks
Maternal Age At EDD: 30.2 yr
OSBR Risk 1 IN: 1762
Test Results:: NEGATIVE
Weight: 220 [lb_av]

## 2019-03-07 DIAGNOSIS — O351XX0 Maternal care for (suspected) chromosomal abnormality in fetus, not applicable or unspecified: Secondary | ICD-10-CM | POA: Diagnosis not present

## 2019-03-07 DIAGNOSIS — O99312 Alcohol use complicating pregnancy, second trimester: Secondary | ICD-10-CM | POA: Diagnosis not present

## 2019-03-17 ENCOUNTER — Encounter (HOSPITAL_COMMUNITY): Payer: Self-pay

## 2019-03-17 ENCOUNTER — Ambulatory Visit (HOSPITAL_COMMUNITY): Payer: Federal, State, Local not specified - PPO | Admitting: *Deleted

## 2019-03-17 ENCOUNTER — Other Ambulatory Visit (HOSPITAL_COMMUNITY): Payer: Self-pay | Admitting: *Deleted

## 2019-03-17 ENCOUNTER — Other Ambulatory Visit: Payer: Self-pay

## 2019-03-17 ENCOUNTER — Ambulatory Visit (HOSPITAL_COMMUNITY)
Admission: RE | Admit: 2019-03-17 | Discharge: 2019-03-17 | Disposition: A | Discharge status: Home / Self Care | Payer: Federal, State, Local not specified - PPO | Source: Ambulatory Visit | Attending: Obstetrics and Gynecology | Admitting: Obstetrics and Gynecology

## 2019-03-17 VITALS — BP 114/74 | HR 106 | Temp 98.6°F

## 2019-03-17 DIAGNOSIS — O99210 Obesity complicating pregnancy, unspecified trimester: Secondary | ICD-10-CM | POA: Insufficient documentation

## 2019-03-17 DIAGNOSIS — O10919 Unspecified pre-existing hypertension complicating pregnancy, unspecified trimester: Secondary | ICD-10-CM

## 2019-03-17 DIAGNOSIS — O10912 Unspecified pre-existing hypertension complicating pregnancy, second trimester: Secondary | ICD-10-CM | POA: Diagnosis not present

## 2019-03-17 DIAGNOSIS — Z3A23 23 weeks gestation of pregnancy: Secondary | ICD-10-CM

## 2019-03-17 DIAGNOSIS — O09292 Supervision of pregnancy with other poor reproductive or obstetric history, second trimester: Secondary | ICD-10-CM

## 2019-03-17 DIAGNOSIS — O99212 Obesity complicating pregnancy, second trimester: Secondary | ICD-10-CM | POA: Diagnosis not present

## 2019-03-17 DIAGNOSIS — O09212 Supervision of pregnancy with history of pre-term labor, second trimester: Secondary | ICD-10-CM

## 2019-03-17 DIAGNOSIS — Z362 Encounter for other antenatal screening follow-up: Secondary | ICD-10-CM

## 2019-03-17 DIAGNOSIS — O34219 Maternal care for unspecified type scar from previous cesarean delivery: Secondary | ICD-10-CM

## 2019-03-17 DIAGNOSIS — O10012 Pre-existing essential hypertension complicating pregnancy, second trimester: Secondary | ICD-10-CM

## 2019-03-29 ENCOUNTER — Encounter: Payer: Self-pay | Admitting: Family Medicine

## 2019-03-29 ENCOUNTER — Telehealth (INDEPENDENT_AMBULATORY_CARE_PROVIDER_SITE_OTHER): Payer: Federal, State, Local not specified - PPO | Admitting: Family Medicine

## 2019-03-29 VITALS — BP 131/88

## 2019-03-29 DIAGNOSIS — Z98891 History of uterine scar from previous surgery: Secondary | ICD-10-CM

## 2019-03-29 DIAGNOSIS — O10919 Unspecified pre-existing hypertension complicating pregnancy, unspecified trimester: Secondary | ICD-10-CM

## 2019-03-29 DIAGNOSIS — O10912 Unspecified pre-existing hypertension complicating pregnancy, second trimester: Secondary | ICD-10-CM

## 2019-03-29 DIAGNOSIS — O0992 Supervision of high risk pregnancy, unspecified, second trimester: Secondary | ICD-10-CM

## 2019-03-29 DIAGNOSIS — O09219 Supervision of pregnancy with history of pre-term labor, unspecified trimester: Secondary | ICD-10-CM

## 2019-03-29 DIAGNOSIS — Z3A24 24 weeks gestation of pregnancy: Secondary | ICD-10-CM

## 2019-03-29 DIAGNOSIS — O09212 Supervision of pregnancy with history of pre-term labor, second trimester: Secondary | ICD-10-CM

## 2019-03-29 DIAGNOSIS — O99212 Obesity complicating pregnancy, second trimester: Secondary | ICD-10-CM

## 2019-03-29 DIAGNOSIS — O99210 Obesity complicating pregnancy, unspecified trimester: Secondary | ICD-10-CM

## 2019-03-29 NOTE — Progress Notes (Signed)
   TELEHEALTH VIRTUAL OBSTETRICS VISIT ENCOUNTER NOTE  I connected with April Davenport on 03/29/19 at  1:30 PM EDT by telephone at home and verified that I am speaking with the correct person using two identifiers.   I discussed the limitations, risks, security and privacy concerns of performing an evaluation and management service by telephone and the availability of in person appointments. I also discussed with the patient that there may be a patient responsible charge related to this service. The patient expressed understanding and agreed to proceed.  Subjective:  April Davenport is a 28 y.o. [phone] at [redacted]w[redacted]d being followed for ongoing prenatal care.  She is currently monitored for the following issues for this high-risk pregnancy and has Essential hypertension; History of kidney stones; Obesity, Class III, BMI 40-49.9 (morbid obesity) ([place]); Preexisting hypertension complicating pregnancy, antepartum; Supervision of high-risk pregnancy; History of low transverse cesarean section x 1; History of severe pre-eclampsia; Maternal morbid obesity, antepartum ([place]); Previous preterm delivery, antepartum; Low TSH level; and Thickening of nuchal fold of fetus on their problem list.  Patient reports backache. Reports fetal movement. Denies any contractions, bleeding or leaking of fluid.   The following portions of the patient's history were reviewed and updated as appropriate: allergies, current medications, past family history, past medical history, past social history, past surgical history and problem list.   Objective:   General:  Alert, oriented and cooperative.   Mental Status: Normal mood and affect perceived. Normal judgment and thought content.  Rest of physical exam deferred due to type of encounter  Assessment and Plan:  Pregnancy: [id number] at [redacted]w[redacted]d 1. Maternal morbid obesity, antepartum ([place])   2. Preexisting hypertension complicating pregnancy, antepartum BP is ok on no meds Continue ASA   3. History of low transverse cesarean section x 1 Desires TOLAC if ok, ok with C-section  4. Previous preterm delivery, antepartum At 32 wks, due to Pre-eclampsia  5. Supervision of high risk pregnancy in second trimester 28 wk labs and TDaP next visit.  General obstetric precautions including but not limited to vaginal bleeding, contractions, leaking of fluid and fetal movement were reviewed in detail with the patient.  I discussed the assessment and treatment plan with the patient. The patient was provided an opportunity to ask questions and all were answered. The patient agreed with the plan and demonstrated an understanding of the instructions. The patient was advised to call back or seek an in-person office evaluation/go to MAU at [facility] for any urgent or concerning symptoms. Please refer to After Visit Summary for other counseling recommendations.   I provided 16 minutes of non-face-to-face time during this encounter.  Return in 3 weeks (on 04/19/2019) for 28 wk labs, in person.  Future Appointments  Date Time Provider [place]  04/20/2019  8:30 AM Rudy [facility]  04/20/2019  8:30 AM [facility] [place] 1 [facility]  04/26/2019  8:15 AM Emily Filbert, MD [facility]    Donnamae Jude, MD [facility] [place]

## 2019-03-29 NOTE — Patient Instructions (Signed)

## 2019-03-29 NOTE — Progress Notes (Signed)
Pt. States she has no concerns at this time.

## 2019-04-20 ENCOUNTER — Encounter (HOSPITAL_COMMUNITY): Payer: Self-pay

## 2019-04-20 ENCOUNTER — Ambulatory Visit (HOSPITAL_COMMUNITY): Payer: Federal, State, Local not specified - PPO | Admitting: *Deleted

## 2019-04-20 ENCOUNTER — Ambulatory Visit (HOSPITAL_COMMUNITY)
Admission: RE | Admit: 2019-04-20 | Discharge: 2019-04-20 | Disposition: A | Discharge status: Home / Self Care | Payer: Federal, State, Local not specified - PPO | Source: Ambulatory Visit | Attending: Obstetrics | Admitting: Obstetrics

## 2019-04-20 ENCOUNTER — Other Ambulatory Visit: Payer: Self-pay

## 2019-04-20 ENCOUNTER — Other Ambulatory Visit (HOSPITAL_COMMUNITY): Payer: Self-pay | Admitting: *Deleted

## 2019-04-20 DIAGNOSIS — O99213 Obesity complicating pregnancy, third trimester: Secondary | ICD-10-CM | POA: Diagnosis not present

## 2019-04-20 DIAGNOSIS — O10013 Pre-existing essential hypertension complicating pregnancy, third trimester: Secondary | ICD-10-CM

## 2019-04-20 DIAGNOSIS — O99210 Obesity complicating pregnancy, unspecified trimester: Secondary | ICD-10-CM

## 2019-04-20 DIAGNOSIS — O10913 Unspecified pre-existing hypertension complicating pregnancy, third trimester: Secondary | ICD-10-CM

## 2019-04-20 DIAGNOSIS — O34219 Maternal care for unspecified type scar from previous cesarean delivery: Secondary | ICD-10-CM

## 2019-04-20 DIAGNOSIS — O09213 Supervision of pregnancy with history of pre-term labor, third trimester: Secondary | ICD-10-CM

## 2019-04-20 DIAGNOSIS — Z3A28 28 weeks gestation of pregnancy: Secondary | ICD-10-CM

## 2019-04-20 DIAGNOSIS — O10919 Unspecified pre-existing hypertension complicating pregnancy, unspecified trimester: Secondary | ICD-10-CM | POA: Insufficient documentation

## 2019-04-20 DIAGNOSIS — Z362 Encounter for other antenatal screening follow-up: Secondary | ICD-10-CM

## 2019-04-21 ENCOUNTER — Encounter (HOSPITAL_COMMUNITY): Payer: Self-pay

## 2019-04-21 ENCOUNTER — Ambulatory Visit (HOSPITAL_COMMUNITY): Payer: Federal, State, Local not specified - PPO

## 2019-04-26 ENCOUNTER — Other Ambulatory Visit: Payer: Self-pay

## 2019-04-26 ENCOUNTER — Inpatient Hospital Stay (HOSPITAL_COMMUNITY)
Admission: AD | Admit: 2019-04-26 | Discharge: 2019-04-26 | Disposition: A | Discharge status: Home / Self Care | Payer: Federal, State, Local not specified - PPO | Attending: Family Medicine | Admitting: Family Medicine

## 2019-04-26 ENCOUNTER — Ambulatory Visit (INDEPENDENT_AMBULATORY_CARE_PROVIDER_SITE_OTHER): Payer: Federal, State, Local not specified - PPO | Admitting: Obstetrics & Gynecology

## 2019-04-26 ENCOUNTER — Encounter (HOSPITAL_COMMUNITY): Payer: Self-pay

## 2019-04-26 VITALS — BP 112/70 | HR 100 | Wt 232.0 lb

## 2019-04-26 DIAGNOSIS — O10913 Unspecified pre-existing hypertension complicating pregnancy, third trimester: Secondary | ICD-10-CM

## 2019-04-26 DIAGNOSIS — O99613 Diseases of the digestive system complicating pregnancy, third trimester: Secondary | ICD-10-CM | POA: Insufficient documentation

## 2019-04-26 DIAGNOSIS — Z3A28 28 weeks gestation of pregnancy: Secondary | ICD-10-CM | POA: Diagnosis not present

## 2019-04-26 DIAGNOSIS — O10919 Unspecified pre-existing hypertension complicating pregnancy, unspecified trimester: Secondary | ICD-10-CM

## 2019-04-26 DIAGNOSIS — Z885 Allergy status to narcotic agent status: Secondary | ICD-10-CM | POA: Diagnosis not present

## 2019-04-26 DIAGNOSIS — IMO0002 Reserved for concepts with insufficient information to code with codable children: Secondary | ICD-10-CM

## 2019-04-26 DIAGNOSIS — O10013 Pre-existing essential hypertension complicating pregnancy, third trimester: Secondary | ICD-10-CM | POA: Diagnosis not present

## 2019-04-26 DIAGNOSIS — Z23 Encounter for immunization: Secondary | ICD-10-CM | POA: Diagnosis not present

## 2019-04-26 DIAGNOSIS — R635 Abnormal weight gain: Secondary | ICD-10-CM

## 2019-04-26 DIAGNOSIS — O99213 Obesity complicating pregnancy, third trimester: Secondary | ICD-10-CM

## 2019-04-26 DIAGNOSIS — O26893 Other specified pregnancy related conditions, third trimester: Secondary | ICD-10-CM | POA: Diagnosis not present

## 2019-04-26 DIAGNOSIS — R109 Unspecified abdominal pain: Secondary | ICD-10-CM | POA: Diagnosis not present

## 2019-04-26 DIAGNOSIS — R12 Heartburn: Secondary | ICD-10-CM | POA: Diagnosis not present

## 2019-04-26 DIAGNOSIS — O0992 Supervision of high risk pregnancy, unspecified, second trimester: Secondary | ICD-10-CM | POA: Diagnosis not present

## 2019-04-26 DIAGNOSIS — Z98891 History of uterine scar from previous surgery: Secondary | ICD-10-CM

## 2019-04-26 DIAGNOSIS — Z7982 Long term (current) use of aspirin: Secondary | ICD-10-CM | POA: Insufficient documentation

## 2019-04-26 DIAGNOSIS — O0993 Supervision of high risk pregnancy, unspecified, third trimester: Secondary | ICD-10-CM

## 2019-04-26 DIAGNOSIS — O351XX0 Maternal care for (suspected) chromosomal abnormality in fetus, not applicable or unspecified: Secondary | ICD-10-CM

## 2019-04-26 DIAGNOSIS — O99210 Obesity complicating pregnancy, unspecified trimester: Secondary | ICD-10-CM

## 2019-04-26 LAB — CBC
HCT: 33.5 % — ABNORMAL LOW (ref 36.0–46.0)
Hemoglobin: 11 g/dL — ABNORMAL LOW (ref 12.0–15.0)
MCH: 28.2 pg (ref 26.0–34.0)
MCHC: 32.8 g/dL (ref 30.0–36.0)
MCV: 85.9 fL (ref 80.0–100.0)
Platelets: 245 10*3/uL (ref 150–400)
RBC: 3.9 MIL/uL (ref 3.87–5.11)
RDW: 14 % (ref 11.5–15.5)
WBC: 10.7 10*3/uL — ABNORMAL HIGH (ref 4.0–10.5)
nRBC: 0 % (ref 0.0–0.2)

## 2019-04-26 LAB — COMPREHENSIVE METABOLIC PANEL
ALT: 14 U/L (ref 0–44)
AST: 13 U/L — ABNORMAL LOW (ref 15–41)
Albumin: 2.7 g/dL — ABNORMAL LOW (ref 3.5–5.0)
Alkaline Phosphatase: 101 U/L (ref 38–126)
Anion gap: 9 (ref 5–15)
BUN: 5 mg/dL — ABNORMAL LOW (ref 6–20)
CO2: 21 mmol/L — ABNORMAL LOW (ref 22–32)
Calcium: 9.1 mg/dL (ref 8.9–10.3)
Chloride: 105 mmol/L (ref 98–111)
Creatinine, Ser: 0.58 mg/dL (ref 0.44–1.00)
GFR calc Af Amer: >60 mL/min (ref >60)
GFR calc non Af Amer: >60 mL/min (ref >60)
Glucose, Bld: 89 mg/dL (ref 70–99)
Potassium: 4 mmol/L (ref 3.5–5.1)
Sodium: 135 mmol/L (ref 135–145)
Total Bilirubin: 0.5 mg/dL (ref 0.3–1.2)
Total Protein: 6.6 g/dL (ref 6.5–8.1)

## 2019-04-26 LAB — URINALYSIS, ROUTINE W REFLEX MICROSCOPIC
Bilirubin Urine: NEGATIVE
Glucose, UA: NEGATIVE mg/dL
Hgb urine dipstick: NEGATIVE
Ketones, ur: 20 mg/dL — AB
Leukocytes,Ua: NEGATIVE
Nitrite: NEGATIVE
Protein, ur: NEGATIVE mg/dL
Specific Gravity, Urine: 1.012 (ref 1.005–1.030)
pH: 6 (ref 5.0–8.0)

## 2019-04-26 LAB — PROTEIN / CREATININE RATIO, URINE
Creatinine, Urine: 79.69 mg/dL
Protein Creatinine Ratio: 0.1 mg/mg{Cre} (ref 0.00–0.15)
Total Protein, Urine: 8 mg/dL

## 2019-04-26 MED ORDER — LABETALOL HCL 100 MG PO TABS: 100.0000 mg | ORAL_TABLET | Freq: Two times a day (BID) | ORAL | 2 refills | Status: DC

## 2019-04-26 NOTE — MAU Provider Note (Addendum)
Chief Complaint:  Abdominal Pain   First Provider Initiated Contact with Patient 04/26/19 1908     HPI: April Davenport is a 28 y.o. [id number] at [redacted]w[redacted]d who presents to maternity admissions reporting abdominal pain. Symptoms started earlier today. Reports pain & discomfort across her upper abdomen & epigastric area. Pain is constant & she describes it as a pressure. Also has some nausea.  Has chronic hypertension & a history of preeclampsia resulting in a preterm delivery. Currently taking baby ASA but is not on an antihypertensive & has been normotensive in the office. Reports current headache that she rates 1/10 & says she has floaters in her vision when she lies flat.   Denies contractions, leakage of fluid or vaginal bleeding. Good fetal movement.  Location: upper abdomen Quality: cramping, pressure Severity: 5/10 in pain scale Duration: <1 day Timing: constant Modifying factors: nothing makes better or worse Associated signs and symptoms: nausea  Pregnancy Course: [facility]. CHTN on baby ASA. HX preeclampsia with 32 wk delivery  Past Medical History:  Diagnosis Date  . Hypertension   . Right nephrolithiasis    OB History  Gravida Para Term Preterm AB Living  SAB TAB Ectopic Multiple Live Births  1       1    # Outcome Date GA Lbr Len/2nd Weight Sex Delivery Anes PTL Lv  3 Current           2 Preterm 05/16/13 [redacted]w[redacted]d   F CS-LTranv Spinal  LIV     Complications: Severe preeclampsia  1 SAB            Past Surgical History:  Procedure Laterality Date  . CESAREAN SECTION  05/16/2013  . KNEE SURGERY Right    Family History  Problem Relation Age of Onset  . Diabetes Mother   . Heart attack Father   . Diabetes Paternal Grandfather    Social History   Tobacco Use  . Smoking status: Never Smoker  . Smokeless tobacco: Never Used  Substance Use Topics  . Alcohol use: Not Currently    Frequency: Never    Comment: rare  . Drug use: Never   Allergies  Allergen  Reactions  . Dilaudid [Hydromorphone Hcl] Hives, Shortness Of Breath and Itching    Syncope  . Black [employer] and Swelling   Medications Prior to Admission  Medication Sig Dispense Refill Last Dose  . aspirin EC 81 MG tablet Take 1 tablet (81 mg total) by mouth daily. Take after 12 weeks for prevention of preeclampsia later in pregnancy 300 tablet 2 04/25/2019 at Unknown time  . Blood Pressure Monitoring (BLOOD PRESSURE CUFF) MISC 1 Device by Does not apply route once a week. To be monitored weekly 1 each 0 04/26/2019 at Unknown time  . Prenatal Vit-Fe Fumarate-FA (PRENATAL MULTIVITAMIN) TABS tablet Take 1 tablet by mouth daily at 12 noon.   04/25/2019 at Unknown time  . acetaminophen (TYLENOL) 325 MG tablet Take 650 mg by mouth every 6 (six) hours as needed.     . doxylamine, Sleep, (UNISOM) 25 MG tablet Take 25 mg by mouth at bedtime as needed.     . loperamide (IMODIUM) 2 MG capsule Take 1 capsule (2 mg total) by mouth 4 (four) times daily as needed for diarrhea or loose stools. (Patient not taking: Reported on 02/01/2019) 30 capsule 0   . pyridOXINE (VITAMIN B-6) 100 MG tablet Take 100 mg by mouth daily.  I have reviewed patient's Past Medical Hx, Surgical Hx, Family Hx, Social Hx, medications and allergies.   ROS:  Review of Systems  Constitutional: Negative.   Eyes: Positive for visual disturbance.  Gastrointestinal: Positive for abdominal pain and nausea. Negative for constipation, diarrhea and vomiting.  Genitourinary: Negative.   Neurological: Positive for headaches.    Physical Exam   Patient Vitals for the past 24 hrs:  BP Pulse  04/26/19 2001 (!) 143/84 87  04/26/19 1946 (!) 146/80 86  04/26/19 1931 (!) 142/71 85  04/26/19 1916 (!) 155/97 85  04/26/19 1904 (!) 156/88 86  04/26/19 1851 (!) 158/88 85    Constitutional: Well-developed, well-nourished female in no acute distress.  Cardiovascular: normal rate & rhythm, no murmur Respiratory: normal  effort, lung sounds clear throughout GI: Abd soft, non-tender, gravid appropriate for gestational age. Pos BS x 4 MS: Extremities nontender, no edema, normal ROM Neurologic: Alert and oriented x 4.  GU:      Pelvic: NEFG, physiologic discharge, no blood, cervix clean.   Dilation: Closed Effacement (%): Thick Cervical Position: Anterior Exam by:: ERIN, NP  NST:  Baseline: 135 bpm, Variability: Good {> 6 bpm), Accelerations: Non-reactive but appropriate for gestational age and Decelerations: Absent   Labs: Results for orders placed or performed during the hospital encounter of 04/26/19 (from the past 24 hour(s))  Urinalysis, Routine w reflex microscopic     Status: Abnormal   Collection Time: 04/26/19  6:55 PM  Result Value Ref Range   Color, Urine YELLOW YELLOW   APPearance HAZY (A) CLEAR   Specific Gravity, Urine 1.012 1.005 - 1.030   pH 6.0 5.0 - 8.0   Glucose, UA NEGATIVE NEGATIVE mg/dL   Hgb urine dipstick NEGATIVE NEGATIVE   Bilirubin Urine NEGATIVE NEGATIVE   Ketones, ur 20 (A) NEGATIVE mg/dL   Protein, ur NEGATIVE NEGATIVE mg/dL   Nitrite NEGATIVE NEGATIVE   Leukocytes,Ua NEGATIVE NEGATIVE  Protein / creatinine ratio, urine     Status: None   Collection Time: 04/26/19  7:00 PM  Result Value Ref Range   Creatinine, Urine 79.69 mg/dL   Total Protein, Urine 8 mg/dL   Protein Creatinine Ratio 0.10 0.00 - 0.15 mg/mg[Cre]  CBC     Status: Abnormal   Collection Time: 04/26/19  7:23 PM  Result Value Ref Range   WBC 10.7 (H) 4.0 - 10.5 K/uL   RBC 3.90 3.87 - 5.11 MIL/uL   Hemoglobin 11.0 (L) 12.0 - 15.0 g/dL   HCT 70.9 (L) 62.8 - 36.6 %   MCV 85.9 80.0 - 100.0 fL   MCH 28.2 26.0 - 34.0 pg   MCHC 32.8 30.0 - 36.0 g/dL   RDW 29.4 76.5 - 46.5 %   Platelets 245 150 - 400 K/uL   nRBC 0.0 0.0 - 0.2 %  Comprehensive metabolic panel     Status: Abnormal   Collection Time: 04/26/19  7:23 PM  Result Value Ref Range   Sodium 135 135 - 145 mmol/L   Potassium 4.0 3.5 - 5.1 mmol/L    Chloride 105 98 - 111 mmol/L   CO2 21 (L) 22 - 32 mmol/L   Glucose, Bld 89 70 - 99 mg/dL   BUN 5 (L) 6 - 20 mg/dL   Creatinine, Ser 0.35 0.44 - 1.00 mg/dL   Calcium 9.1 8.9 - 46.5 mg/dL   Total Protein 6.6 6.5 - 8.1 g/dL   Albumin 2.7 (L) 3.5 - 5.0 g/dL   AST 13 (L) 15 - 41  U/L   ALT 14 0 - 44 U/L   Alkaline Phosphatase 101 38 - 126 U/L   Total Bilirubin 0.5 0.3 - 1.2 mg/dL   GFR calc non Af Amer >60 >60 mL/min   GFR calc Af Amer >60 >60 mL/min   Anion gap 9 5 - 15    Imaging:  No results found.  MAU Course: Orders Placed This Encounter  Procedures  . Urinalysis, Routine w reflex microscopic  . CBC  . Comprehensive metabolic panel  . Protein / creatinine ratio, urine     MDM: Fetal tracing appropriate for gestation. Cervix closed/thick/firm/anterior Pt describes pain has constant upper abdominal cramping & pressure. Doesn't feel like she's having contractions. Hx of PTD due to severe preeclampsia.   Elevated BPs. Pt not on antihypertensive. Has been normotensive during her prenatal visits. Reports a headache that she currently rates 1/10. States while lying flat in her MAU bed, she had an episode of floaters in her vision.   Preeclampsia labs pending Care turned over to Steward DroneVeronica Kashlynn Kundert CNM  Judeth HornLawrence, Erin, NP 04/26/2019 8:05 PM  Labs reviewed: PEC labs negative  Reassessment of patient - patient reports all symptoms have resolved   Consult with Dr Adrian BlackwaterStinson on patient assessment, symptoms and labs- Okay with discharge home and starting on Labetalol.  Discussed plan of care with patient, patient is concerned about being on hypertensive medication and is worried about BP bottoming out- educated and discussed risks/benefits of medication. Discussed importance of close follow up in the office next week for BP check. PEC precautions reviewed with patient.   Meds ordered this encounter  Medications  . labetalol (NORMODYNE) 100 MG tablet    Sig: Take 1 tablet (100 mg  total) by mouth 2 (two) times daily.    Dispense:  60 tablet    Refill:  2    Order Specific Question:   Supervising Provider    Answer:   Levie HeritageSTINSON, JACOB J [[id number]]   Message sent to office for scheduling of BP check- patient verbalizes that she would like virtual appointment d/t having BP cuff at home.  Discussed reasons to return to MAU. Follow up as scheduled in the office. Return to MAU as needed. Pt stable at time of discharge.   ASSESSMENT/PLAN:  1. Chronic hypertension affecting pregnancy   2. Heartburn during pregnancy in third trimester   3. [redacted] weeks gestation of pregnancy    Discharge home Follow up as scheduled in the office for prenatal care Return to MAU as needed for reasons discussed and/or emergencies  NST reactive  Rx for Labetalol sent to pharmacy of choice  PEC precautions  Message sent to office to schedule BP check in 1 week   Follow-up Information    [facility]. Schedule an appointment as soon as possible for a visit in 1 week(s).   Specialty: Obstetrics and Gynecology Why: Make appointment to be seen for blood pressure check  Contact information: [address] [place] [id number] [phone]         Allergies as of 04/26/2019      Reactions   Dilaudid [hydromorphone Hcl] Hives, Shortness Of Breath, Itching   Syncope   Black [employer] Fl[employer] Hives, Swelling      Medication List    TAKE these medications   acetaminophen 325 MG tablet Commonly known as: TYLENOL Take 650 mg by mouth every 6 (six) hours as needed.   aspirin EC 81 MG tablet Take  1 tablet (81 mg total) by mouth daily. Take after 12 weeks for prevention of preeclampsia later in pregnancy   Blood Pressure Cuff Misc 1 Device by Does not apply route once a week. To be monitored weekly   doxylamine (Sleep) 25 MG tablet Commonly known as: UNISOM Take 25 mg by mouth at bedtime as needed.   labetalol 100 MG tablet Commonly known  as: NORMODYNE Take 1 tablet (100 mg total) by mouth 2 (two) times daily.   loperamide 2 MG capsule Commonly known as: IMODIUM Take 1 capsule (2 mg total) by mouth 4 (four) times daily as needed for diarrhea or loose stools.   prenatal multivitamin Tabs tablet Take 1 tablet by mouth daily at 12 noon.   pyridOXINE 100 MG tablet Commonly known as: VITAMIN B-6 Take 100 mg by mouth daily.       Lajean Manes, CNM 04/26/19, 10:49 PM

## 2019-04-26 NOTE — Progress Notes (Signed)
3  PRENATAL VISIT NOTE  Subjective:  April Davenport is a 28 y.o. [id number] at [redacted]w[redacted]d being seen today for ongoing prenatal care.  She is currently monitored for the following issues for this high-risk pregnancy and has Essential hypertension; History of kidney stones; Obesity, Class III, BMI 40-49.9 (morbid obesity) ([place]); Preexisting hypertension complicating pregnancy, antepartum; Supervision of high-risk pregnancy; History of low transverse cesarean section x 1; History of severe pre-eclampsia; Maternal morbid obesity, antepartum ([place]); Previous preterm delivery, antepartum; Low TSH level; and Thickening of nuchal fold of fetus on their problem list.  Patient reports no complaints.  Contractions: Not present.  .  Movement: Present. Denies leaking of fluid.   The following portions of the patient's history were reviewed and updated as appropriate: allergies, current medications, past family history, past medical history, past social history, past surgical history and problem list.   Objective:   Vitals:   04/26/19 0819  BP: 112/70  Pulse: 100  Weight: 232 lb (105.2 kg)    Fetal Status: Fetal Heart Rate (bpm): 145   Movement: Present     General:  Alert, oriented and cooperative. Patient is in no acute distress.  Skin: Skin is warm and dry. No rash noted.   Cardiovascular: Normal heart rate noted  Respiratory: Normal respiratory effort, no problems with respiration noted  Abdomen: Soft, gravid, appropriate for gestational age.  Pain/Pressure: Absent     Pelvic: Cervical exam deferred        Extremities: Normal range of motion.  Edema: None  Mental Status: Normal mood and affect. Normal behavior. Normal judgment and thought content.   Assessment and Plan:  Pregnancy: [id number] at [redacted]w[redacted]d 1. Supervision of high risk pregnancy in second trimester - TDAP - RPR - CBC - Glucose Tolerance, 2 Hours w/1 Hour - HIV antibody (with reflex)  2. Maternal morbid obesity, antepartum ([place]) -  discussed weight gain recommendations, offered nutritionist consult  3. Preexisting hypertension complicating pregnancy, antepartum - on baby asa, good BPs  4. History of low transverse cesarean section x 1 - considering TOLAC  5. Thickening of nuchal fold of fetus - normal NIPS, she declined amnio  Preterm labor symptoms and general obstetric precautions including but not limited to vaginal bleeding, contractions, leaking of fluid and fetal movement were reviewed in detail with the patient. Please refer to After Visit Summary for other counseling recommendations.   No follow-ups on file.  Future Appointments  Date Time Provider [place]  05/18/2019  7:45 AM [facility] [place] 5 WH-MFCUS MFC-US  05/18/2019  7:50 AM [facility] NURSE WH-MFC MFC-US    Emily Filbert, MD

## 2019-04-26 NOTE — MAU Note (Signed)
Pt reports to MAU with complaints of abdominal pain that started to worsen the past two hours. Denies bleeding and leaking, confirms movement.

## 2019-04-27 ENCOUNTER — Telehealth: Payer: Self-pay | Admitting: *Deleted

## 2019-04-27 ENCOUNTER — Encounter: Payer: Self-pay | Admitting: Obstetrics & Gynecology

## 2019-04-27 DIAGNOSIS — O99019 Anemia complicating pregnancy, unspecified trimester: Secondary | ICD-10-CM | POA: Insufficient documentation

## 2019-04-27 LAB — GLUCOSE TOLERANCE, 2 HOURS W/ 1HR
Glucose, 1 hour: 156 mg/dL (ref 65–179)
Glucose, 2 hour: 69 mg/dL (ref 65–152)
Glucose, Fasting: 82 mg/dL (ref 65–91)

## 2019-04-27 LAB — CBC
Hematocrit: 31.4 % — ABNORMAL LOW (ref 34.0–46.6)
Hemoglobin: 10.7 g/dL — ABNORMAL LOW (ref 11.1–15.9)
MCH: 28.7 pg (ref 26.6–33.0)
MCHC: 34.1 g/dL (ref 31.5–35.7)
MCV: 84 fL (ref 79–97)
Platelets: 267 10*3/uL (ref 150–450)
RBC: 3.73 x10E6/uL — ABNORMAL LOW (ref 3.77–5.28)
RDW: 13.4 % (ref 11.7–15.4)
WBC: 9.4 10*3/uL (ref 3.4–10.8)

## 2019-04-27 LAB — RPR: RPR Ser Ql: NONREACTIVE

## 2019-04-27 LAB — HIV ANTIBODY (ROUTINE TESTING W REFLEX): HIV Screen 4th Generation wRfx: NONREACTIVE

## 2019-04-27 NOTE — Telephone Encounter (Signed)
Went over lab results with pt 

## 2019-04-27 NOTE — Telephone Encounter (Signed)
-----   Message from Allena Earing, NT sent at 04/27/2019  3:32 PM EST ----- Regarding: lab results Please call patient with lab panel

## 2019-05-04 ENCOUNTER — Telehealth (INDEPENDENT_AMBULATORY_CARE_PROVIDER_SITE_OTHER): Payer: Federal, State, Local not specified - PPO | Admitting: *Deleted

## 2019-05-04 ENCOUNTER — Other Ambulatory Visit: Payer: Self-pay

## 2019-05-04 VITALS — BP 102/69 | HR 86

## 2019-05-04 DIAGNOSIS — O10919 Unspecified pre-existing hypertension complicating pregnancy, unspecified trimester: Secondary | ICD-10-CM

## 2019-05-04 NOTE — Progress Notes (Signed)
Called pt today for a BP check, this morning pt states her BP was 144/90 and denied any other symptoms, Pt states she was at work and just ate. Pt also stated she was only taking the labetalol once a day, due to taking it twice a day made her feel lethargic. Told pt to wait an hour and retake her BP when she was more settled and could sit for a few minutes.  Called pt to get new BP reading. Pt states her BP was 102/69. Advised pt to try and take the labetalol twice daily, but if it still made her feel bad to call the office back. Pt verbalizes and understands. Will continue to monitor her BP and will follow up as needed and at next ROB.

## 2019-05-04 NOTE — Progress Notes (Signed)
ATTESTATION OF SUPERVISION OF RN: Evaluation and management procedures were performed by the RN under my supervision and collaboration. I have reviewed the nursing note and chart and agree with the management and plan for this patient.  Samantha Weinhold, CNM  

## 2019-05-09 ENCOUNTER — Encounter: Payer: Self-pay | Admitting: *Deleted

## 2019-05-09 IMAGING — CT CT RENAL STONE PROTOCOL
2 of 4 series · 15 of 46 positions shown, 17 images · non-contrast
Comparison: None.

CLINICAL DATA: Right flank pain and hematuria

EXAM:
CT ABDOMEN AND PELVIS WITHOUT CONTRAST
TECHNIQUE: Multidetector CT imaging of the abdomen and pelvis was performed
following the standard protocol without oral or IV contrast.

[Series 2: axial st · axial · 0.74mm/px · z∈[+1077,+1497]mm · 12 of 94 slices shown, 14 images]
[im 5/94  soft-tissue]
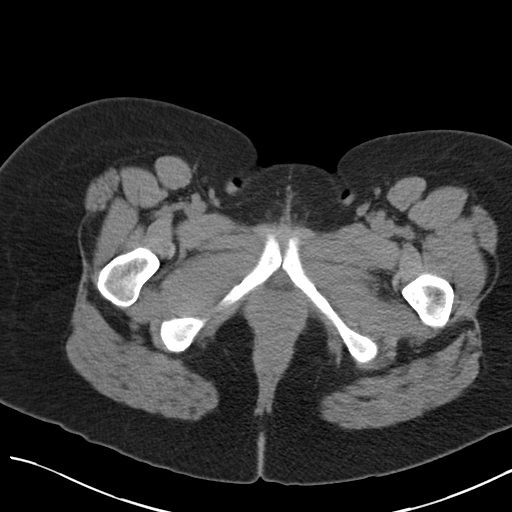
[im 5/94  bone]
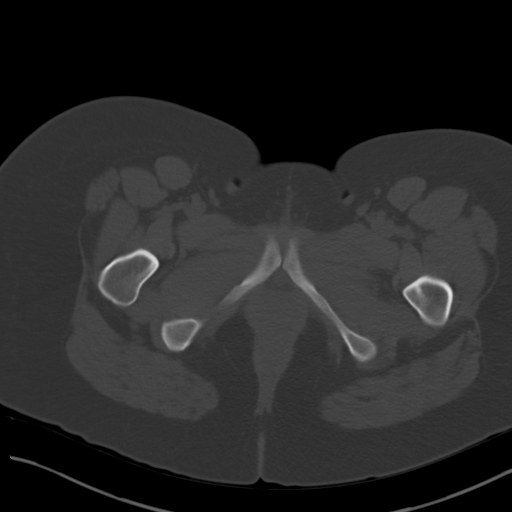
[im 15/94  soft-tissue]
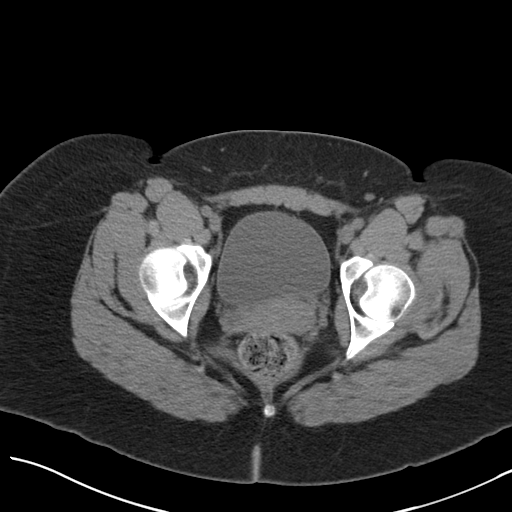
[im 20/94  soft-tissue]
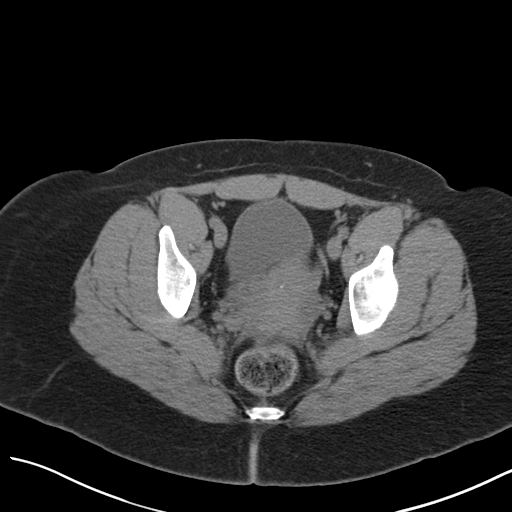
[im 30/94  soft-tissue]
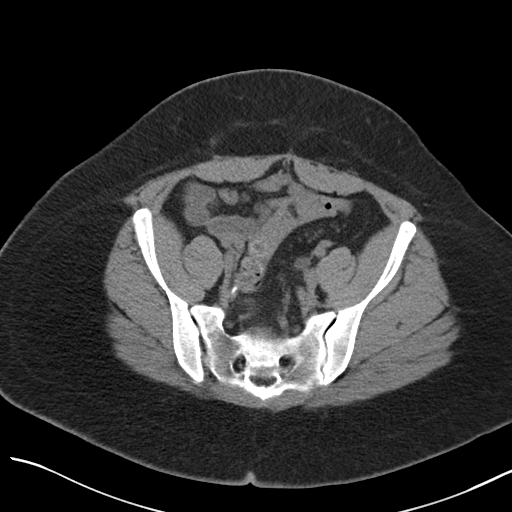
[im 35/94  soft-tissue]
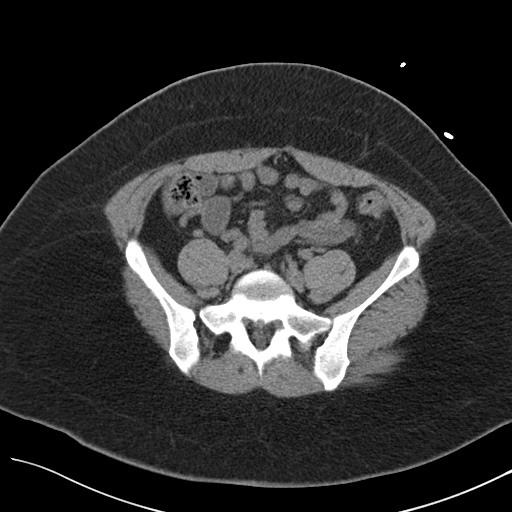
[im 45/94  soft-tissue]
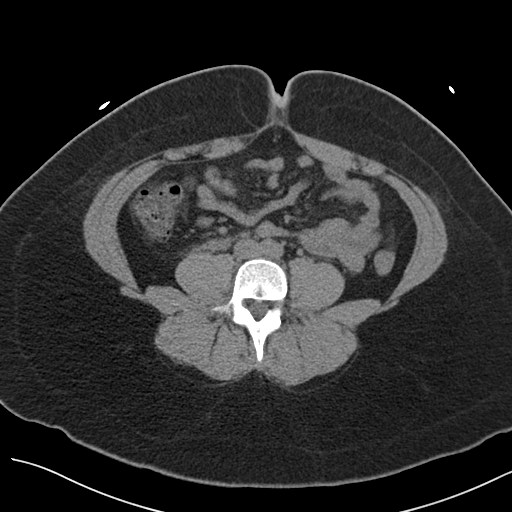
[im 49/94  soft-tissue]
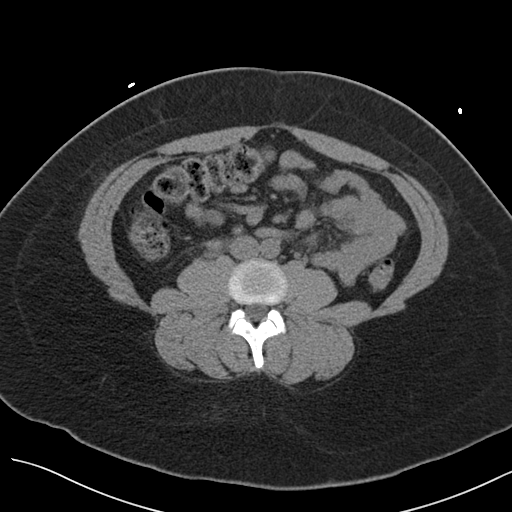
[im 59/94  soft-tissue]
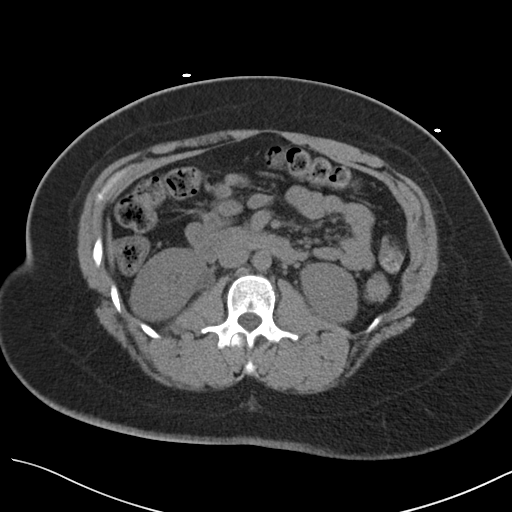
[im 64/94  soft-tissue]
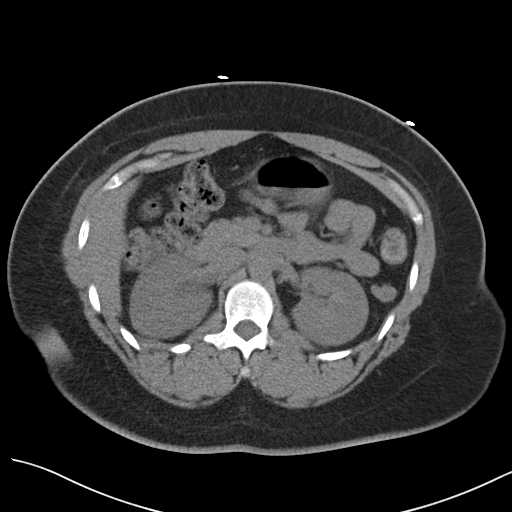
[im 64/94  bone]
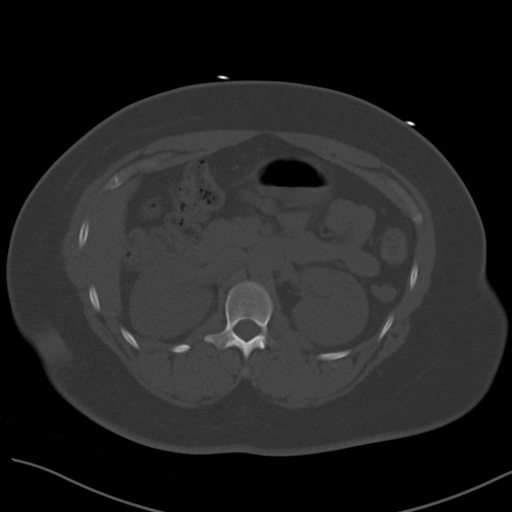
[im 74/94  soft-tissue]
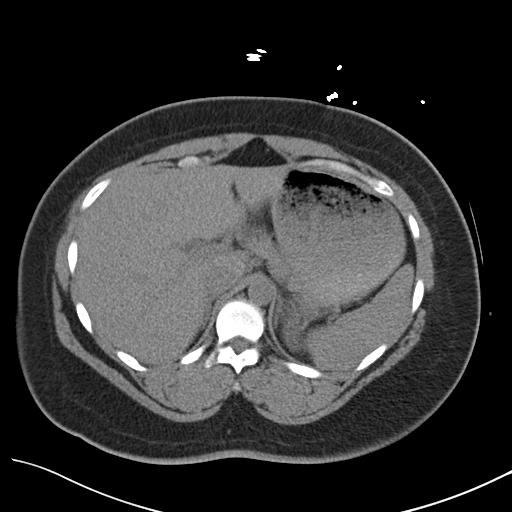
[im 79/94  soft-tissue]
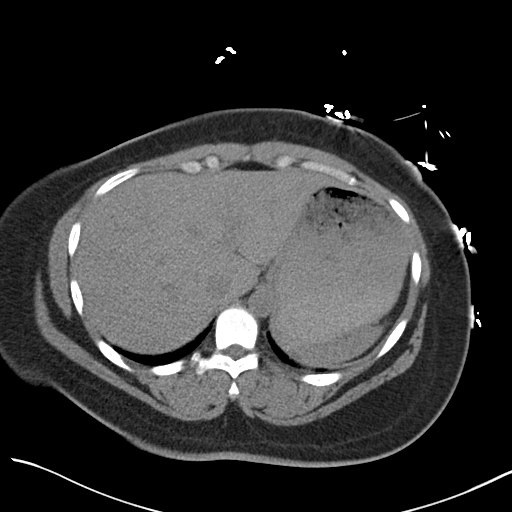
[im 89/94  soft-tissue]
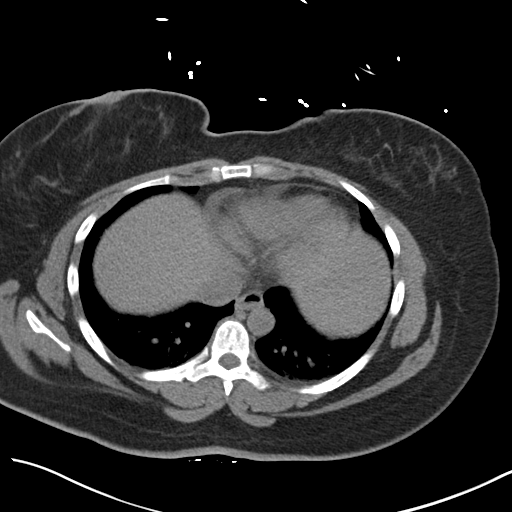

[Series 4: coronal · coronal · 0.88mm/px · 3 of 116 slices shown]
[im 39/116  soft-tissue]
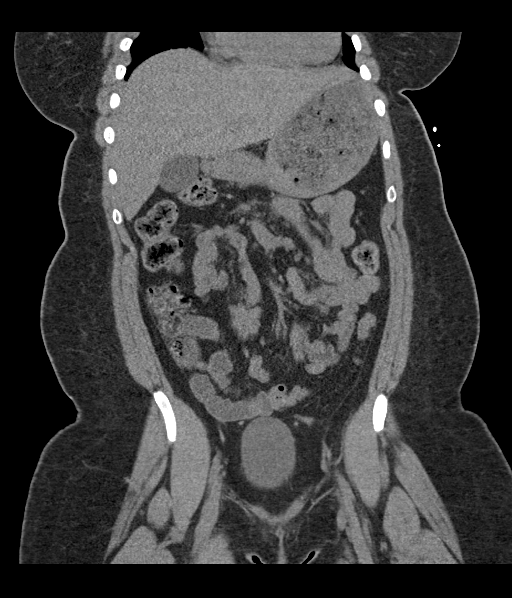
[im 52/116  soft-tissue]
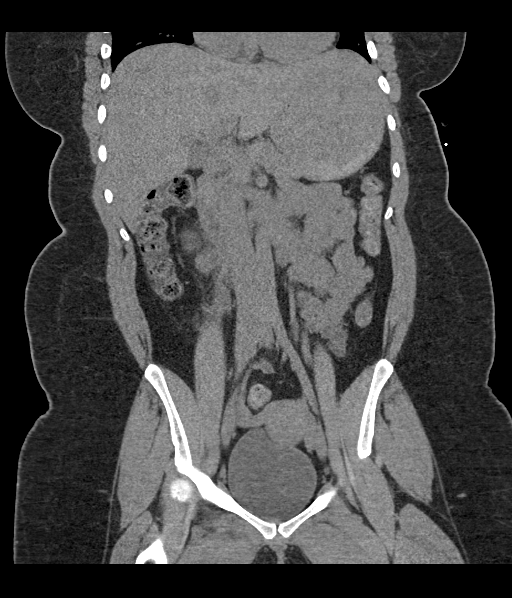
[im 64/116  soft-tissue]
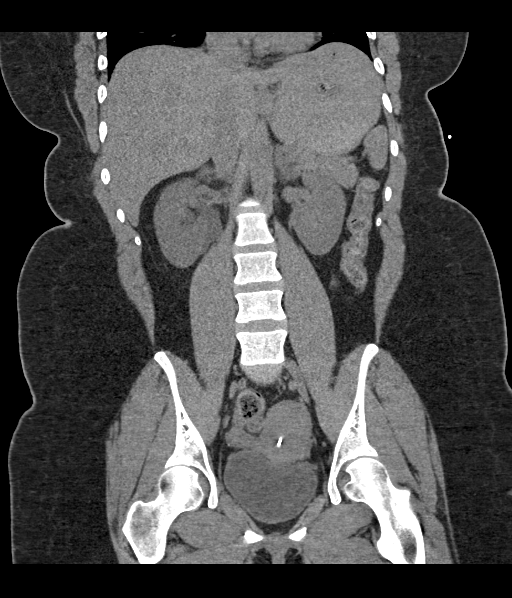

[15 of 46 positions shown; findings below may reference images not displayed]

FINDINGS: Lower chest: Lung bases are clear.

Hepatobiliary: No focal liver lesions are evident on this
noncontrast enhanced study. There is no appreciable gallbladder wall
thickening. No evident biliary duct dilatation.

Pancreas: No pancreatic mass or inflammatory focus.

Spleen: No splenic lesions are evident.

Adrenals/Urinary Tract: Adrenals bilaterally appear normal. Right
kidney is subtly edematous. There is no renal mass on either side.
There is moderate hydronephrosis on the right. There is no
hydronephrosis on the left. There is no intrarenal calculus on
either side. There is no appreciable ureteral calculus on either
side. The right ureter is prominent. Urinary bladder is midline with
wall thickness within normal limits.

Stomach/Bowel: There is moderate stool in the colon. There is no
appreciable bowel wall or mesenteric thickening. No evident bowel
obstruction. No free air or portal venous air. Stomach is borderline
distended with food material and fluid.

Vascular/Lymphatic: No abdominal aortic aneurysm. No arterial
vascular calcification evident. No adenopathy evident in the abdomen
or pelvis by size criteria. Several small mesenteric lymph nodes are
noted in the right mid to lower abdomen.

Reproductive: Uterus is anteverted. Intrauterine device is
positioned within the endometrium. There is no pelvic mass. A small
amount of fluid tracks from the left ovary along the lateral distal
sigmoid colon.

Other: Appendix appears normal. No abscess or ascites is evident in
the abdomen or pelvis. There is a small ventral hernia containing
only fat.

Musculoskeletal: There are no blastic or lytic bone lesions. No
intramuscular lesions are evident.
IMPRESSION: 1. Moderate hydronephrosis and ureterectasis on the right without
appreciable ureteral calculus. Question recent calculus passage.
Pyelonephritis could present similarly. Right kidney is subtly
edematous. Note that there is no perinephric fluid or stranding, and
no renal abscess noted on the right.

2.  No intrarenal calculi evident in either kidney.

3. No bowel obstruction. No free air or portal venous air. No
abscess in the abdomen or pelvis. Appendix appears normal.

4. Several small lymph nodes in the right mid to lower abdomen.
Question reactive etiology due to the changes involving the right
kidney and ureter. A degree of mesenteric adenitis in the
appropriate clinical setting could also present in this manner.

5.  Intrauterine device positioned within the endometrium.

6.  Small ventral hernia containing only fat.

## 2019-05-18 ENCOUNTER — Other Ambulatory Visit (HOSPITAL_COMMUNITY): Payer: Self-pay | Admitting: Obstetrics

## 2019-05-18 ENCOUNTER — Other Ambulatory Visit: Payer: Self-pay

## 2019-05-18 ENCOUNTER — Ambulatory Visit (HOSPITAL_COMMUNITY)
Admission: RE | Admit: 2019-05-18 | Discharge: 2019-05-18 | Disposition: A | Discharge status: Home / Self Care | Payer: Federal, State, Local not specified - PPO | Source: Ambulatory Visit | Attending: Obstetrics and Gynecology | Admitting: Obstetrics and Gynecology

## 2019-05-18 ENCOUNTER — Other Ambulatory Visit (HOSPITAL_COMMUNITY): Payer: Self-pay | Admitting: *Deleted

## 2019-05-18 ENCOUNTER — Encounter (HOSPITAL_COMMUNITY): Payer: Self-pay

## 2019-05-18 ENCOUNTER — Ambulatory Visit (HOSPITAL_COMMUNITY): Payer: Federal, State, Local not specified - PPO | Admitting: *Deleted

## 2019-05-18 VITALS — BP 120/75 | HR 105 | Temp 96.7°F

## 2019-05-18 DIAGNOSIS — O10013 Pre-existing essential hypertension complicating pregnancy, third trimester: Secondary | ICD-10-CM

## 2019-05-18 DIAGNOSIS — O99213 Obesity complicating pregnancy, third trimester: Secondary | ICD-10-CM

## 2019-05-18 DIAGNOSIS — O99210 Obesity complicating pregnancy, unspecified trimester: Secondary | ICD-10-CM | POA: Diagnosis not present

## 2019-05-18 DIAGNOSIS — O34219 Maternal care for unspecified type scar from previous cesarean delivery: Secondary | ICD-10-CM

## 2019-05-18 DIAGNOSIS — O10913 Unspecified pre-existing hypertension complicating pregnancy, third trimester: Secondary | ICD-10-CM

## 2019-05-18 DIAGNOSIS — O10919 Unspecified pre-existing hypertension complicating pregnancy, unspecified trimester: Secondary | ICD-10-CM | POA: Insufficient documentation

## 2019-05-18 DIAGNOSIS — O09213 Supervision of pregnancy with history of pre-term labor, third trimester: Secondary | ICD-10-CM

## 2019-05-18 DIAGNOSIS — O09293 Supervision of pregnancy with other poor reproductive or obstetric history, third trimester: Secondary | ICD-10-CM

## 2019-05-18 DIAGNOSIS — Z3A32 32 weeks gestation of pregnancy: Secondary | ICD-10-CM

## 2019-05-24 ENCOUNTER — Encounter (HOSPITAL_COMMUNITY): Payer: Self-pay | Admitting: *Deleted

## 2019-05-24 ENCOUNTER — Ambulatory Visit (HOSPITAL_COMMUNITY): Payer: Federal, State, Local not specified - PPO | Admitting: *Deleted

## 2019-05-24 ENCOUNTER — Ambulatory Visit (HOSPITAL_COMMUNITY)
Admission: RE | Admit: 2019-05-24 | Discharge: 2019-05-24 | Disposition: A | Discharge status: Home / Self Care | Payer: Federal, State, Local not specified - PPO | Source: Ambulatory Visit | Attending: Obstetrics and Gynecology | Admitting: Obstetrics and Gynecology

## 2019-05-24 ENCOUNTER — Other Ambulatory Visit: Payer: Self-pay

## 2019-05-24 DIAGNOSIS — O10013 Pre-existing essential hypertension complicating pregnancy, third trimester: Secondary | ICD-10-CM

## 2019-05-24 DIAGNOSIS — O99210 Obesity complicating pregnancy, unspecified trimester: Secondary | ICD-10-CM | POA: Insufficient documentation

## 2019-05-24 DIAGNOSIS — O99213 Obesity complicating pregnancy, third trimester: Secondary | ICD-10-CM | POA: Diagnosis not present

## 2019-05-24 DIAGNOSIS — O34219 Maternal care for unspecified type scar from previous cesarean delivery: Secondary | ICD-10-CM

## 2019-05-24 DIAGNOSIS — O09213 Supervision of pregnancy with history of pre-term labor, third trimester: Secondary | ICD-10-CM | POA: Diagnosis not present

## 2019-05-24 DIAGNOSIS — O99013 Anemia complicating pregnancy, third trimester: Secondary | ICD-10-CM | POA: Insufficient documentation

## 2019-05-24 DIAGNOSIS — O09293 Supervision of pregnancy with other poor reproductive or obstetric history, third trimester: Secondary | ICD-10-CM

## 2019-05-24 DIAGNOSIS — Z3A32 32 weeks gestation of pregnancy: Secondary | ICD-10-CM

## 2019-05-24 DIAGNOSIS — O10913 Unspecified pre-existing hypertension complicating pregnancy, third trimester: Secondary | ICD-10-CM | POA: Insufficient documentation

## 2019-05-26 ENCOUNTER — Other Ambulatory Visit: Payer: Self-pay

## 2019-05-26 ENCOUNTER — Telehealth (INDEPENDENT_AMBULATORY_CARE_PROVIDER_SITE_OTHER): Payer: Federal, State, Local not specified - PPO | Admitting: Obstetrics and Gynecology

## 2019-05-26 ENCOUNTER — Encounter: Payer: Self-pay | Admitting: Obstetrics and Gynecology

## 2019-05-26 VITALS — BP 115/78

## 2019-05-26 DIAGNOSIS — O10919 Unspecified pre-existing hypertension complicating pregnancy, unspecified trimester: Secondary | ICD-10-CM

## 2019-05-26 DIAGNOSIS — Z98891 History of uterine scar from previous surgery: Secondary | ICD-10-CM

## 2019-05-26 DIAGNOSIS — O10913 Unspecified pre-existing hypertension complicating pregnancy, third trimester: Secondary | ICD-10-CM

## 2019-05-26 DIAGNOSIS — O99013 Anemia complicating pregnancy, third trimester: Secondary | ICD-10-CM

## 2019-05-26 DIAGNOSIS — Z8759 Personal history of other complications of pregnancy, childbirth and the puerperium: Secondary | ICD-10-CM

## 2019-05-26 DIAGNOSIS — O0993 Supervision of high risk pregnancy, unspecified, third trimester: Secondary | ICD-10-CM

## 2019-05-26 DIAGNOSIS — R7989 Other specified abnormal findings of blood chemistry: Secondary | ICD-10-CM

## 2019-05-26 DIAGNOSIS — O3663X0 Maternal care for excessive fetal growth, third trimester, not applicable or unspecified: Secondary | ICD-10-CM

## 2019-05-26 DIAGNOSIS — O0992 Supervision of high risk pregnancy, unspecified, second trimester: Secondary | ICD-10-CM

## 2019-05-26 DIAGNOSIS — Z3A33 33 weeks gestation of pregnancy: Secondary | ICD-10-CM

## 2019-05-26 NOTE — Progress Notes (Signed)
115/78 

## 2019-05-26 NOTE — Progress Notes (Signed)
TELEHEALTH VIRTUAL OBSTETRICS VISIT ENCOUNTER NOTE  [facility]inic: [facility]  I connected with [name] on 05/26/19 at  8:15 AM EST by telephone at home and verified that I am speaking with the correct person using two identifiers.   I discussed the limitations, risks, security and privacy concerns of performing an evaluation and management service by telephone and the availability of in person appointments. I also discussed with the patient that there may be a patient responsible charge related to this service. The patient expressed understanding and agreed to proceed.  Subjective:  April Davenport is a 28 y.o. [id number] at [redacted]w[redacted]d being followed for ongoing prenatal care.  She is currently monitored for the following issues for this high-risk pregnancy and has Essential hypertension; History of kidney stones; Obesity, Class III, BMI 40-49.9 (morbid obesity) ([place]); Preexisting hypertension complicating pregnancy, antepartum; Supervision of high-risk pregnancy; History of low transverse cesarean section x 1; History of severe pre-eclampsia; Maternal morbid obesity, antepartum ([place]); Previous preterm delivery, antepartum; Low TSH level; Thickening of nuchal fold of fetus; and Anemia in pregnancy on their problem list.  Patient reports no complaints. Reports fetal movement. Denies any contractions, bleeding or leaking of fluid.   The following portions of the patient's history were reviewed and updated as appropriate: allergies, current medications, past family history, past medical history, past social history, past surgical history and problem list.   Objective:   Vitals:   05/26/19 0823  BP: 115/78    Babyscripts Data Reviewed: yes  General:  Alert, oriented and cooperative.   Mental Status: Normal mood and affect perceived. Normal judgment and thought content.  Rest of physical exam deferred due to type of encounter  Assessment and Plan:  Pregnancy: [id number] at  [redacted]w[redacted]d 1. Preexisting hypertension complicating pregnancy, antepartum Continue labetalol 100 bid and low dose asa qwk testing normal  2. Supervision of high risk pregnancy in second trimester Routine care  3. Excessive fetal growth affecting management of pregnancy in third trimester, single or unspecified fetus F/u rpt growth early jan. Neg GDM screening D/w her re: implications with tolac and her ob history. Pt leaning towards tolac if spont labor before 39wks. Can d/w her more  4. History of low transverse cesarean section x 1 See above. PL updated 2014: pLTCS, [place], [place].32wks, NRFHT and hard to control BPs per patient. Never told she couldn't labor. tolac consent signed  5. History of severe pre-eclampsia  6. Low TSH level Neg ft4   Preterm labor symptoms and general obstetric precautions including but not limited to vaginal bleeding, contractions, leaking of fluid and fetal movement were reviewed in detail with the patient.  I discussed the assessment and treatment plan with the patient. The patient was provided an opportunity to ask questions and all were answered. The patient agreed with the plan and demonstrated an understanding of the instructions. The patient was advised to call back or seek an in-person office evaluation/go to MAU at [facility] for any urgent or concerning symptoms. Please refer to After Visit Summary for other counseling recommendations.   I provided 10 minutes of non-face-to-face time during this encounter. The visit was conducted via MyChart-medicine  Return in about 2 weeks (around 06/09/2019) for virtual visit, high risk.  Future Appointments  Date Time Provider [place]  06/02/2019  7:45 AM [place] NURSE [place] MFC-US  06/02/2019  7:45 AM [place] 2 WH-MFCUS MFC-US  06/08/2019  1:00 PM Caren Macadam, MD [facility]  06/09/2019  7:45 AM WH-MFC NURSE WH-MFC MFC-US  06/09/2019  7:45 AM WH-MFC [place] 2  WH-MFCUS MFC-US  06/16/2019  3:45 PM WH-MFC NURSE WH-MFC MFC-US  06/16/2019  3:45 PM WH-MFC [place] 2 WH-MFCUS MFC-US    [place] Bing, MD Center for [employer], [facility]

## 2019-05-31 ENCOUNTER — Other Ambulatory Visit: Payer: Self-pay

## 2019-05-31 ENCOUNTER — Encounter (HOSPITAL_COMMUNITY): Payer: Self-pay | Admitting: Obstetrics and Gynecology

## 2019-05-31 ENCOUNTER — Inpatient Hospital Stay (HOSPITAL_COMMUNITY)
Admission: AD | Admit: 2019-05-31 | Discharge: 2019-05-31 | Disposition: A | Discharge status: Home / Self Care | Payer: Federal, State, Local not specified - PPO | Attending: Obstetrics and Gynecology | Admitting: Obstetrics and Gynecology

## 2019-05-31 DIAGNOSIS — Z79899 Other long term (current) drug therapy: Secondary | ICD-10-CM | POA: Diagnosis not present

## 2019-05-31 DIAGNOSIS — Z833 Family history of diabetes mellitus: Secondary | ICD-10-CM | POA: Diagnosis not present

## 2019-05-31 DIAGNOSIS — Z885 Allergy status to narcotic agent status: Secondary | ICD-10-CM | POA: Diagnosis not present

## 2019-05-31 DIAGNOSIS — O4703 False labor before 37 completed weeks of gestation, third trimester: Secondary | ICD-10-CM | POA: Insufficient documentation

## 2019-05-31 DIAGNOSIS — O23593 Infection of other part of genital tract in pregnancy, third trimester: Secondary | ICD-10-CM | POA: Diagnosis not present

## 2019-05-31 DIAGNOSIS — O163 Unspecified maternal hypertension, third trimester: Secondary | ICD-10-CM | POA: Diagnosis not present

## 2019-05-31 DIAGNOSIS — O34219 Maternal care for unspecified type scar from previous cesarean delivery: Secondary | ICD-10-CM | POA: Diagnosis not present

## 2019-05-31 DIAGNOSIS — Z91018 Allergy to other foods: Secondary | ICD-10-CM | POA: Insufficient documentation

## 2019-05-31 DIAGNOSIS — B9689 Other specified bacterial agents as the cause of diseases classified elsewhere: Secondary | ICD-10-CM | POA: Diagnosis not present

## 2019-05-31 DIAGNOSIS — N760 Acute vaginitis: Secondary | ICD-10-CM

## 2019-05-31 DIAGNOSIS — Z3A33 33 weeks gestation of pregnancy: Secondary | ICD-10-CM | POA: Insufficient documentation

## 2019-05-31 DIAGNOSIS — O479 False labor, unspecified: Secondary | ICD-10-CM

## 2019-05-31 LAB — URINALYSIS, ROUTINE W REFLEX MICROSCOPIC
Bilirubin Urine: NEGATIVE
Glucose, UA: NEGATIVE mg/dL
Hgb urine dipstick: NEGATIVE
Ketones, ur: NEGATIVE mg/dL
Leukocytes,Ua: NEGATIVE
Nitrite: NEGATIVE
Protein, ur: NEGATIVE mg/dL
Specific Gravity, Urine: 1.012 (ref 1.005–1.030)
pH: 7 (ref 5.0–8.0)

## 2019-05-31 LAB — WET PREP, GENITAL
Sperm: NONE SEEN
Trich, Wet Prep: NONE SEEN
Yeast Wet Prep HPF POC: NONE SEEN

## 2019-05-31 LAB — POCT FERN TEST: POCT Fern Test: NEGATIVE

## 2019-05-31 MED ORDER — METRONIDAZOLE 500 MG PO TABS: 500.0000 mg | ORAL_TABLET | Freq: Two times a day (BID) | ORAL | 0 refills | Status: DC

## 2019-05-31 NOTE — MAU Provider Note (Signed)
Chief Complaint:  Contractions and Rupture of Membranes   First Provider Initiated Contact with Patient 05/31/19 1151     HPI: April Davenport is a 28 y.o. [id number] at [redacted]w[redacted]d who presents to maternity admissions reporting abdominal cramping & vaginal irritation/discharge. Symptoms started last week. Reports intermittent lower abdominal cramping that feels like contractions. Can't tell how frequent. Also reports vaginal irritation. Had not noticed a change in discharge but does report that her underwear is wet whenever she goes to the bathroom.  Denies dysuria, vaginal bleeding, or recent intercourse.  Good fetal movement.   Location: abdomen Quality: cramping Severity: 3/10 in pain scale Duration: 1 week Timing: intermittent Modifying factors: none Associated signs and symptoms: vaginal discharge  Pregnancy Course: [place]. CHTN. Hx of preeclampsia with 32 wk delivery  Past Medical History:  Diagnosis Date  . Hypertension   . Right nephrolithiasis    OB History  Gravida Para Term Preterm AB Living  3 1   1 1 1   SAB TAB Ectopic Multiple Live Births  1       1    # Outcome Date GA Lbr Len/2nd Weight Sex Delivery Anes PTL Lv  3 Current           2 Preterm 05/16/13 [redacted]w[redacted]d  2268 g F CS-LTranv Spinal N LIV     Complications: Severe preeclampsia  1 SAB            Past Surgical History:  Procedure Laterality Date  . CESAREAN SECTION  05/16/2013  . KNEE SURGERY Right    Family History  Problem Relation Age of Onset  . Diabetes Mother   . Heart attack Father   . Diabetes Paternal Grandfather    Social History   Tobacco Use  . Smoking status: Never Smoker  . Smokeless tobacco: Never Used  Substance Use Topics  . Alcohol use: Not Currently    Comment: rare  . Drug use: Never   Allergies  Allergen Reactions  . Dilaudid [Hydromorphone Hcl] Hives, Shortness Of Breath and Itching    Syncope  . [employer] and Swelling   Medications Prior to Admission   Medication Sig Dispense Refill Last Dose  . acetaminophen (TYLENOL) 325 MG tablet Take 650 mg by mouth every 6 (six) hours as needed.   Past Month at Unknown time  . aspirin EC 81 MG tablet Take 1 tablet (81 mg total) by mouth daily. Take after 12 weeks for prevention of preeclampsia later in pregnancy 300 tablet 2 05/30/2019 at Unknown time  . Blood Pressure Monitoring (BLOOD PRESSURE CUFF) MISC 1 Device by Does not apply route once a week. To be monitored weekly 1 each 0 05/31/2019 at Unknown time  . labetalol (NORMODYNE) 100 MG tablet Take 1 tablet (100 mg total) by mouth 2 (two) times daily. 60 tablet 2 05/30/2019 at Unknown time  . Prenatal Vit-Fe Fumarate-FA (PRENATAL MULTIVITAMIN) TABS tablet Take 1 tablet by mouth daily at 12 noon.   05/30/2019 at Unknown time  . doxylamine, Sleep, (UNISOM) 25 MG tablet Take 25 mg by mouth at bedtime as needed.     . loperamide (IMODIUM) 2 MG capsule Take 1 capsule (2 mg total) by mouth 4 (four) times daily as needed for diarrhea or loose stools. (Patient not taking: Reported on 02/01/2019) 30 capsule 0   . pyridOXINE (VITAMIN B-6) 100 MG tablet Take 100 mg by mouth daily.       I have reviewed patient's Past Medical Hx, Surgical Hx,  Family Hx, Social Hx, medications and allergies.   ROS:  Review of Systems  Constitutional: Negative.   Gastrointestinal: Positive for abdominal pain. Negative for constipation, diarrhea, nausea and vomiting.  Genitourinary: Positive for vaginal discharge. Negative for dysuria and vaginal bleeding.  Musculoskeletal: Positive for back pain.    Physical Exam   Patient Vitals for the past 24 hrs:  BP Pulse Height Weight  05/31/19 1134 136/79 (!) 109 -- --  05/31/19 1124 -- -- 5\' 1"  (1.549 m) 108.9 kg    Constitutional: Well-developed, well-nourished female in no acute distress.  Cardiovascular: normal rate & rhythm, no murmur Respiratory: normal effort, lung sounds clear throughout GI: Abd soft, non-tender, gravid  appropriate for gestational age. Pos BS x 4 MS: Extremities nontender, no edema, normal ROM Neurologic: Alert and oriented x 4.  GU:      Pelvic: NEFG, small amount of thin yellow discharge. No blood. No pooling of fluid. Cervix visually closed.      NST:  Baseline: 135 bpm, Variability: Good {> 6 bpm), Accelerations: Reactive, Decelerations: Absent and ctx x 1   Labs: Results for orders placed or performed during the hospital encounter of 05/31/19 (from the past 24 hour(s))  Urinalysis, Routine w reflex microscopic     Status: Abnormal   Collection Time: 05/31/19 11:59 AM  Result Value Ref Range   Color, Urine YELLOW YELLOW   APPearance HAZY (A) CLEAR   Specific Gravity, Urine 1.012 1.005 - 1.030   pH 7.0 5.0 - 8.0   Glucose, UA NEGATIVE NEGATIVE mg/dL   Hgb urine dipstick NEGATIVE NEGATIVE   Bilirubin Urine NEGATIVE NEGATIVE   Ketones, ur NEGATIVE NEGATIVE mg/dL   Protein, ur NEGATIVE NEGATIVE mg/dL   Nitrite NEGATIVE NEGATIVE   Leukocytes,Ua NEGATIVE NEGATIVE  Wet prep, genital     Status: Abnormal   Collection Time: 05/31/19 12:02 PM   Specimen: Cervix  Result Value Ref Range   Yeast Wet Prep HPF POC NONE SEEN NONE SEEN   Trich, Wet Prep NONE SEEN NONE SEEN   Clue Cells Wet Prep HPF POC PRESENT (A) NONE SEEN   WBC, Wet Prep HPF POC FEW (A) NONE SEEN   Sperm NONE SEEN   Fern Test     Status: None   Collection Time: 05/31/19 12:18 PM  Result Value Ref Range   POCT Fern Test Negative = intact amniotic membranes     Imaging:  No results found.  MAU Course: Orders Placed This Encounter  Procedures  . Wet prep, genital  . Urinalysis, Routine w reflex microscopic  . Fern Test  . Discharge patient   Meds ordered this encounter  Medications  . metroNIDAZOLE (FLAGYL) 500 MG tablet    Sig: Take 1 tablet (500 mg total) by mouth 2 (two) times daily.    Dispense:  14 tablet    Refill:  0    Order Specific Question:   Supervising Provider    Answer:   Alysia PennaERVIN, MICHAEL L  [[id number]]    MDM: Reactive tracing with 1 contractions noted on TOCO Pt in no distress SSE performed; no pooling of fluid & cervix visually closed. Fern negative.   GC/CT & wet prep collected. Wet prep + for clue cells with increase in discharge noted. Will prescribe flagyl.  U/a negative for signs of infection.  Assessment: 1. Bacterial vaginosis   2. [redacted] weeks gestation of pregnancy   3. Braxton Hick's contraction     Plan: Discharge home in stable condition.  Preterm Labor  precautions and fetal kick counts Rx flagyl GC/CT pending  Follow-up Information    [facility] Follow up.   Specialty: Obstetrics and Gynecology Why: return for worsening symptoms Contact information: [address] [id number] mc [place] [address] [phone]          Allergies as of 05/31/2019      Reactions   Dilaudid [hydromorphone Hcl] Hives, Shortness Of Breath, Itching   Syncope   Black [employer], Swelling      Medication List    STOP taking these medications   doxylamine (Sleep) 25 MG tablet Commonly known as: UNISOM   loperamide 2 MG capsule Commonly known as: IMODIUM   pyridOXINE 100 MG tablet Commonly known as: VITAMIN B-6     TAKE these medications   acetaminophen 325 MG tablet Commonly known as: TYLENOL Take 650 mg by mouth every 6 (six) hours as needed.   aspirin EC 81 MG tablet Take 1 tablet (81 mg total) by mouth daily. Take after 12 weeks for prevention of preeclampsia later in pregnancy   Blood Pressure Cuff Misc 1 Device by Does not apply route once a week. To be monitored weekly   labetalol 100 MG tablet Commonly known as: NORMODYNE Take 1 tablet (100 mg total) by mouth 2 (two) times daily.   metroNIDAZOLE 500 MG tablet Commonly known as: FLAGYL Take 1 tablet (500 mg total) by mouth 2 (two) times daily.   prenatal multivitamin Tabs tablet Take 1 tablet by mouth daily at 12 noon.       Judeth Horn, NP 05/31/2019 12:56 PM

## 2019-05-31 NOTE — MAU Note (Signed)
.  April Davenport is a 28 y.o. at [redacted]w[redacted]d here in MAU reporting: lower abdominal cramping for a couple of days. Also states that her vagina has been sore for a week. Denies any VB, but states her underwear is staying damp LMP: Onset of complaint: a week Pain score: 3 There were no vitals filed for this visit.   FHT:145 Lab orders placed from triage: UA VS B/P 136/79 P 112 R 18 Temp 98.7

## 2019-05-31 NOTE — Discharge Instructions (Signed)
Bacterial Vaginosis  Bacterial vaginosis is a vaginal infection that occurs when the normal balance of bacteria in the vagina is disrupted. It results from an overgrowth of certain bacteria. This is the most common vaginal infection among women ages 15-44. Because bacterial vaginosis increases your risk for STIs (sexually transmitted infections), getting treated can help reduce your risk for chlamydia, gonorrhea, herpes, and HIV (human immunodeficiency virus). Treatment is also important for preventing complications in pregnant women, because this condition can cause an early (premature) delivery. What are the causes? This condition is caused by an increase in harmful bacteria that are normally present in small amounts in the vagina. However, the reason that the condition develops is not fully understood. What increases the risk? The following factors may make you more likely to develop this condition:  Having a new sexual partner or multiple sexual partners.  Having unprotected sex.  Douching.  Having an intrauterine device (IUD).  Smoking.  Drug and alcohol abuse.  Taking certain antibiotic medicines.  Being pregnant. You cannot get bacterial vaginosis from toilet seats, bedding, swimming pools, or contact with objects around you. What are the signs or symptoms? Symptoms of this condition include:  Grey or white vaginal discharge. The discharge can also be watery or foamy.  A fish-like odor with discharge, especially after sexual intercourse or during menstruation.  Itching in and around the vagina.  Burning or pain with urination. Some women with bacterial vaginosis have no signs or symptoms. How is this diagnosed? This condition is diagnosed based on:  Your medical history.  A physical exam of the vagina.  Testing a sample of vaginal fluid under a microscope to look for a large amount of bad bacteria or abnormal cells. Your health care provider may use a cotton swab or  a small wooden spatula to collect the sample. How is this treated? This condition is treated with antibiotics. These may be given as a pill, a vaginal cream, or a medicine that is put into the vagina (suppository). If the condition comes back after treatment, a second round of antibiotics may be needed. Follow these instructions at home: Medicines  Take over-the-counter and prescription medicines only as told by your health care provider.  Take or use your antibiotic as told by your health care provider. Do not stop taking or using the antibiotic even if you start to feel better. General instructions  If you have a female sexual partner, tell her that you have a vaginal infection. She should see her health care provider and be treated if she has symptoms. If you have a female sexual partner, he does not need treatment.  During treatment: ? Avoid sexual activity until you finish treatment. ? Do not douche. ? Avoid alcohol as directed by your health care provider. ? Avoid breastfeeding as directed by your health care provider.  Drink enough water and fluids to keep your urine clear or pale yellow.  Keep the area around your vagina and rectum clean. ? Wash the area daily with warm water. ? Wipe yourself from front to back after using the toilet.  Keep all follow-up visits as told by your health care provider. This is important. How is this prevented?  Do not douche.  Wash the outside of your vagina with warm water only.  Use protection when having sex. This includes latex condoms and dental dams.  Limit how many sexual partners you have. To help prevent bacterial vaginosis, it is best to have sex with just one partner (  monogamous).  Make sure you and your sexual partner are tested for STIs.  Wear cotton or cotton-lined underwear.  Avoid wearing tight pants and pantyhose, especially during summer.  Limit the amount of alcohol that you drink.  Do not use any products that contain  nicotine or tobacco, such as cigarettes and e-cigarettes. If you need help quitting, ask your health care provider.  Do not use illegal drugs. Where to find more information  Centers for Disease Control and Prevention: SolutionApps.co.za  American Sexual Health Association (ASHA): www.ashastd.org  U.S. Department of Health and Health and safety inspector, Office on Women's Health: ConventionalMedicines.si or http://www.anderson-williamson.info/ Contact a health care provider if:  Your symptoms do not improve, even after treatment.  You have more discharge or pain when urinating.  You have a fever.  You have pain in your abdomen.  You have pain during sex.  You have vaginal bleeding between periods. Summary  Bacterial vaginosis is a vaginal infection that occurs when the normal balance of bacteria in the vagina is disrupted.  Because bacterial vaginosis increases your risk for STIs (sexually transmitted infections), getting treated can help reduce your risk for chlamydia, gonorrhea, herpes, and HIV (human immunodeficiency virus). Treatment is also important for preventing complications in pregnant women, because the condition can cause an early (premature) delivery.  This condition is treated with antibiotic medicines. These may be given as a pill, a vaginal cream, or a medicine that is put into the vagina (suppository). This information is not intended to replace advice given to you by your health care provider. Make sure you discuss any questions you have with your health care provider. Document Released: 05/26/2005 Document Revised: 05/08/2017 Document Reviewed: 02/09/2016 Elsevier Patient Education  2020 Elsevier Inc.     Fetal Movement Counts Patient Name: ________________________________________________ Patient Due Date: ____________________ What is a fetal movement count?  A fetal movement count is the number of times that you feel your baby move during a certain  amount of time. This may also be called a fetal kick count. A fetal movement count is recommended for every pregnant woman. You may be asked to start counting fetal movements as early as week 28 of your pregnancy. Pay attention to when your baby is most active. You may notice your baby's sleep and wake cycles. You may also notice things that make your baby move more. You should do a fetal movement count:  When your baby is normally most active.  At the same time each day. A good time to count movements is while you are resting, after having something to eat and drink. How do I count fetal movements? 1. Find a quiet, comfortable area. Sit, or lie down on your side. 2. Write down the date, the start time and stop time, and the number of movements that you felt between those two times. Take this information with you to your health care visits. 3. For 2 hours, count kicks, flutters, swishes, rolls, and jabs. You should feel at least 10 movements during 2 hours. 4. You may stop counting after you have felt 10 movements. 5. If you do not feel 10 movements in 2 hours, have something to eat and drink. Then, keep resting and counting for 1 hour. If you feel at least 4 movements during that hour, you may stop counting. Contact a health care provider if:  You feel fewer than 4 movements in 2 hours.  Your baby is not moving like he or she usually does. Date: ____________ Start time: ____________  Stop time: ____________ Movements: ____________ Date: ____________ Start time: ____________ Stop time: ____________ Movements: ____________ Date: ____________ Start time: ____________ Stop time: ____________ Movements: ____________ Date: ____________ Start time: ____________ Stop time: ____________ Movements: ____________ Date: ____________ Start time: ____________ Stop time: ____________ Movements: ____________ Date: ____________ Start time: ____________ Stop time: ____________ Movements: ____________ Date:  ____________ Start time: ____________ Stop time: ____________ Movements: ____________ Date: ____________ Start time: ____________ Stop time: ____________ Movements: ____________ Date: ____________ Start time: ____________ Stop time: ____________ Movements: ____________ This information is not intended to replace advice given to you by your health care provider. Make sure you discuss any questions you have with your health care provider. Document Released: 06/25/2006 Document Revised: 06/15/2018 Document Reviewed: 07/05/2015 Elsevier Patient Education  2020 [employer].

## 2019-06-01 LAB — GC/CHLAMYDIA PROBE AMP (~~LOC~~) NOT AT ARMC
Chlamydia: NEGATIVE
Comment: NEGATIVE
Comment: NORMAL
Neisseria Gonorrhea: NEGATIVE

## 2019-06-02 ENCOUNTER — Encounter (HOSPITAL_COMMUNITY): Payer: Self-pay | Admitting: *Deleted

## 2019-06-02 ENCOUNTER — Ambulatory Visit (HOSPITAL_COMMUNITY): Payer: Federal, State, Local not specified - PPO | Admitting: *Deleted

## 2019-06-02 ENCOUNTER — Other Ambulatory Visit: Payer: Self-pay

## 2019-06-02 ENCOUNTER — Other Ambulatory Visit (HOSPITAL_COMMUNITY): Payer: Self-pay | Admitting: Obstetrics and Gynecology

## 2019-06-02 ENCOUNTER — Ambulatory Visit (HOSPITAL_COMMUNITY)
Admission: RE | Admit: 2019-06-02 | Discharge: 2019-06-02 | Disposition: A | Discharge status: Home / Self Care | Payer: Federal, State, Local not specified - PPO | Source: Ambulatory Visit | Attending: Obstetrics and Gynecology | Admitting: Obstetrics and Gynecology

## 2019-06-02 DIAGNOSIS — Z3A34 34 weeks gestation of pregnancy: Secondary | ICD-10-CM

## 2019-06-02 DIAGNOSIS — O34219 Maternal care for unspecified type scar from previous cesarean delivery: Secondary | ICD-10-CM

## 2019-06-02 DIAGNOSIS — O10913 Unspecified pre-existing hypertension complicating pregnancy, third trimester: Secondary | ICD-10-CM

## 2019-06-02 DIAGNOSIS — O99210 Obesity complicating pregnancy, unspecified trimester: Secondary | ICD-10-CM

## 2019-06-02 DIAGNOSIS — O10013 Pre-existing essential hypertension complicating pregnancy, third trimester: Secondary | ICD-10-CM | POA: Diagnosis not present

## 2019-06-02 DIAGNOSIS — O10919 Unspecified pre-existing hypertension complicating pregnancy, unspecified trimester: Secondary | ICD-10-CM

## 2019-06-02 DIAGNOSIS — O99213 Obesity complicating pregnancy, third trimester: Secondary | ICD-10-CM

## 2019-06-02 DIAGNOSIS — O09213 Supervision of pregnancy with history of pre-term labor, third trimester: Secondary | ICD-10-CM | POA: Diagnosis not present

## 2019-06-02 DIAGNOSIS — O09293 Supervision of pregnancy with other poor reproductive or obstetric history, third trimester: Secondary | ICD-10-CM

## 2019-06-02 NOTE — Procedures (Signed)
Jaquelynn Wanamaker 1991/02/21 [redacted]w[redacted]d  Fetus A Non-Stress Test Interpretation for 06/02/19  Indication: Unsatisfactory BPP  Fetal Heart Rate A Mode: External Baseline Rate (A): 135 bpm Variability: Moderate Accelerations: 15 x 15 Decelerations: None Multiple birth?: No  Uterine Activity Mode: Palpation, Toco Contraction Frequency (min): x3 Contraction Duration (sec): 40-100 Contraction Quality: Mild Resting Tone Palpated: Relaxed Resting Time: Adequate  Interpretation (Fetal Testing) Nonstress Test Interpretation: Reactive Comments: Reviewed tracing with Dr. Annamaria Boots

## 2019-06-08 ENCOUNTER — Encounter: Payer: Self-pay | Admitting: Family Medicine

## 2019-06-08 ENCOUNTER — Telehealth (INDEPENDENT_AMBULATORY_CARE_PROVIDER_SITE_OTHER): Payer: Federal, State, Local not specified - PPO | Admitting: Family Medicine

## 2019-06-08 ENCOUNTER — Other Ambulatory Visit: Payer: Self-pay

## 2019-06-08 VITALS — BP 134/98

## 2019-06-08 DIAGNOSIS — Z3A35 35 weeks gestation of pregnancy: Secondary | ICD-10-CM

## 2019-06-08 DIAGNOSIS — O0993 Supervision of high risk pregnancy, unspecified, third trimester: Secondary | ICD-10-CM

## 2019-06-08 DIAGNOSIS — O10913 Unspecified pre-existing hypertension complicating pregnancy, third trimester: Secondary | ICD-10-CM

## 2019-06-08 DIAGNOSIS — Z98891 History of uterine scar from previous surgery: Secondary | ICD-10-CM

## 2019-06-08 DIAGNOSIS — O99210 Obesity complicating pregnancy, unspecified trimester: Secondary | ICD-10-CM

## 2019-06-08 DIAGNOSIS — Z8759 Personal history of other complications of pregnancy, childbirth and the puerperium: Secondary | ICD-10-CM

## 2019-06-08 DIAGNOSIS — O09213 Supervision of pregnancy with history of pre-term labor, third trimester: Secondary | ICD-10-CM

## 2019-06-08 DIAGNOSIS — O99213 Obesity complicating pregnancy, third trimester: Secondary | ICD-10-CM

## 2019-06-08 DIAGNOSIS — O10919 Unspecified pre-existing hypertension complicating pregnancy, unspecified trimester: Secondary | ICD-10-CM

## 2019-06-08 DIAGNOSIS — O09219 Supervision of pregnancy with history of pre-term labor, unspecified trimester: Secondary | ICD-10-CM

## 2019-06-08 NOTE — Progress Notes (Signed)
I connected with  [name] on 06/08/19 at  1:00 PM EST by telephone and verified that I am speaking with the correct person using two identifiers.   I discussed the limitations, risks, security and privacy concerns of performing an evaluation and management service by telephone and the availability of in person appointments. I also discussed with the patient that there may be a patient responsible charge related to this service. The patient expressed understanding and agreed to proceed.  Crosby Oyster, RN 06/08/2019  1:07 PM

## 2019-06-08 NOTE — Progress Notes (Signed)
TELEHEALTH VIRTUAL OBSTETRICS VISIT ENCOUNTER NOTE  I connected with April Davenport on 06/08/19 at  1:00 PM EST by telephone at home and verified that I am speaking with the correct person using two identifiers.   I discussed the limitations, risks, security and privacy concerns of performing an evaluation and management service by telephone and the availability of in person appointments. I also discussed with the patient that there may be a patient responsible charge related to this service. The patient expressed understanding and agreed to proceed.  Subjective:  April Davenport is a 28 y.o. [id number] at [redacted]w[redacted]d being followed for ongoing prenatal care.  She is currently monitored for the following issues for this high-risk pregnancy and has Essential hypertension; History of kidney stones; Obesity, Class III, BMI 40-49.9 (morbid obesity) ([place]); Preexisting hypertension complicating pregnancy, antepartum; Supervision of high-risk pregnancy; History of low transverse cesarean section x 1; History of severe pre-eclampsia; Maternal morbid obesity, antepartum ([place]); Previous preterm delivery, antepartum; Low TSH level; and Thickening of nuchal fold of fetus on their problem list.  Patient reports no complaints. Reports fetal movement. Denies any contractions, bleeding or leaking of fluid.   Has questions of how early she can deliver because is uncomfortable. She wants to deliver as early as possible.   BP elevated today. This morning she checked it  And received the value 142/98- no HA, blurry vision, no RUQ, reports swelling in feet- present for 1wk.   The following portions of the patient's history were reviewed and updated as appropriate: allergies, current medications, past family history, past medical history, past social history, past surgical history and problem list.   Objective:   General:  Alert, oriented and cooperative.   Mental Status: Normal mood and affect perceived. Normal judgment and  thought content.  Rest of physical exam deferred due to type of encounter  Assessment and Plan:  Pregnancy: G3P0111 at [redacted]w[redacted]d 1. Maternal morbid obesity, antepartum (HCC) TWG=27 lb (12.2 kg) which is above goal  2. History of low transverse cesarean section x 1 Desires tolac   3. History of severe pre-eclampsia Delivered preterm for this Currently with elevated BP  4. Previous preterm delivery, antepartum  5. Supervision of high risk pregnancy in third trimester Elevated BP on virtual visit. Throughout pregnancy has been 120/70s. Encouraged timely repeat in the office with lab work for preeclampsia.  Discussed typical timing for delivery - IOL at 39 wk for cHTN or 37 wk for preeclampsia Reviewed risks of preterm brith  6. cHTN Not on medication at baseline and now rising BP.  Reviewed warning s/sx of prex Orders placed for UPC.CBC, CMP  Preterm labor symptoms and general obstetric precautions including but not limited to vaginal bleeding, contractions, leaking of fluid and fetal movement were reviewed in detail with the patient.  I discussed the assessment and treatment plan with the patient. The patient was provided an opportunity to ask questions and all were answered. The patient agreed with the plan and demonstrated an understanding of the instructions. The patient was advised to call back or seek an in-person office evaluation/go to MAU at [facility] for any urgent or concerning symptoms. Please refer to After Visit Summary for other counseling recommendations.   I provided 15 minutes of non-face-to-face time during this encounter.  Return in about 5 days (around 06/13/2019) for BP Check, 36wks-labs.  Future Appointments  Date Time Provider [place]  06/09/2019  7:45 AM [place] [facility]  06/09/2019  7:45 AM [place]  [place] 2 WH-MFCUS M[facility]-US  06/13/2019  8:15 AM [facility] NURSE [facility]  06/16/2019  3:45 PM [facility]-MFC NURSE WH-MFC M[facility]-US    06/16/2019  3:45 PM [facility]-MFC [place] 2 WH-MFCUS MFC-US  06/22/2019  3:00 PM Alvester Morin, Isa Rankin, MD [facility]    Federico Flake, MD [facility], [facility]

## 2019-06-09 ENCOUNTER — Encounter (HOSPITAL_COMMUNITY): Payer: Self-pay

## 2019-06-09 ENCOUNTER — Other Ambulatory Visit: Payer: Self-pay

## 2019-06-09 ENCOUNTER — Ambulatory Visit (HOSPITAL_COMMUNITY): Payer: Federal, State, Local not specified - PPO | Admitting: *Deleted

## 2019-06-09 ENCOUNTER — Ambulatory Visit (HOSPITAL_COMMUNITY)
Admission: RE | Admit: 2019-06-09 | Discharge: 2019-06-09 | Disposition: A | Discharge status: Home / Self Care | Payer: Federal, State, Local not specified - PPO | Source: Ambulatory Visit | Attending: Obstetrics and Gynecology | Admitting: Obstetrics and Gynecology

## 2019-06-09 DIAGNOSIS — O99213 Obesity complicating pregnancy, third trimester: Secondary | ICD-10-CM | POA: Diagnosis not present

## 2019-06-09 DIAGNOSIS — O10913 Unspecified pre-existing hypertension complicating pregnancy, third trimester: Secondary | ICD-10-CM | POA: Insufficient documentation

## 2019-06-09 DIAGNOSIS — O09213 Supervision of pregnancy with history of pre-term labor, third trimester: Secondary | ICD-10-CM

## 2019-06-09 DIAGNOSIS — O403XX0 Polyhydramnios, third trimester, not applicable or unspecified: Secondary | ICD-10-CM | POA: Diagnosis not present

## 2019-06-09 DIAGNOSIS — O99210 Obesity complicating pregnancy, unspecified trimester: Secondary | ICD-10-CM | POA: Diagnosis not present

## 2019-06-09 DIAGNOSIS — O34219 Maternal care for unspecified type scar from previous cesarean delivery: Secondary | ICD-10-CM | POA: Diagnosis not present

## 2019-06-09 DIAGNOSIS — O10013 Pre-existing essential hypertension complicating pregnancy, third trimester: Secondary | ICD-10-CM

## 2019-06-09 DIAGNOSIS — O09293 Supervision of pregnancy with other poor reproductive or obstetric history, third trimester: Secondary | ICD-10-CM

## 2019-06-09 DIAGNOSIS — Z3A35 35 weeks gestation of pregnancy: Secondary | ICD-10-CM

## 2019-06-13 ENCOUNTER — Other Ambulatory Visit: Payer: Self-pay

## 2019-06-13 ENCOUNTER — Encounter (HOSPITAL_COMMUNITY): Payer: Self-pay | Admitting: Obstetrics and Gynecology

## 2019-06-13 ENCOUNTER — Telehealth (INDEPENDENT_AMBULATORY_CARE_PROVIDER_SITE_OTHER): Payer: Federal, State, Local not specified - PPO | Admitting: Obstetrics & Gynecology

## 2019-06-13 ENCOUNTER — Ambulatory Visit (INDEPENDENT_AMBULATORY_CARE_PROVIDER_SITE_OTHER): Payer: Federal, State, Local not specified - PPO

## 2019-06-13 ENCOUNTER — Inpatient Hospital Stay (HOSPITAL_COMMUNITY)
Admission: AD | Admit: 2019-06-13 | Discharge: 2019-06-13 | Disposition: A | Discharge status: Home / Self Care | Payer: Federal, State, Local not specified - PPO | Attending: Obstetrics and Gynecology | Admitting: Obstetrics and Gynecology

## 2019-06-13 VITALS — BP 129/83 | HR 79

## 2019-06-13 DIAGNOSIS — Z0130 Encounter for examination of blood pressure without abnormal findings: Secondary | ICD-10-CM

## 2019-06-13 DIAGNOSIS — Z3A35 35 weeks gestation of pregnancy: Secondary | ICD-10-CM

## 2019-06-13 DIAGNOSIS — O10919 Unspecified pre-existing hypertension complicating pregnancy, unspecified trimester: Secondary | ICD-10-CM

## 2019-06-13 DIAGNOSIS — O0993 Supervision of high risk pregnancy, unspecified, third trimester: Secondary | ICD-10-CM

## 2019-06-13 DIAGNOSIS — Z98891 History of uterine scar from previous surgery: Secondary | ICD-10-CM

## 2019-06-13 DIAGNOSIS — Z8759 Personal history of other complications of pregnancy, childbirth and the puerperium: Secondary | ICD-10-CM

## 2019-06-13 DIAGNOSIS — O163 Unspecified maternal hypertension, third trimester: Secondary | ICD-10-CM

## 2019-06-13 DIAGNOSIS — O10913 Unspecified pre-existing hypertension complicating pregnancy, third trimester: Secondary | ICD-10-CM

## 2019-06-13 DIAGNOSIS — R635 Abnormal weight gain: Secondary | ICD-10-CM

## 2019-06-13 LAB — PROTEIN / CREATININE RATIO, URINE
Creatinine, Urine: 42.35 mg/dL
Total Protein, Urine: <6 mg/dL

## 2019-06-13 LAB — CBC
HCT: 34.8 % — ABNORMAL LOW (ref 36.0–46.0)
Hemoglobin: 11 g/dL — ABNORMAL LOW (ref 12.0–15.0)
MCH: 27.4 pg (ref 26.0–34.0)
MCHC: 31.6 g/dL (ref 30.0–36.0)
MCV: 86.6 fL (ref 80.0–100.0)
Platelets: 286 10*3/uL (ref 150–400)
RBC: 4.02 MIL/uL (ref 3.87–5.11)
RDW: 13.7 % (ref 11.5–15.5)
WBC: 7.6 10*3/uL (ref 4.0–10.5)
nRBC: 0 % (ref 0.0–0.2)

## 2019-06-13 LAB — COMPREHENSIVE METABOLIC PANEL
ALT: 14 U/L (ref 0–44)
AST: 19 U/L (ref 15–41)
Albumin: 2.4 g/dL — ABNORMAL LOW (ref 3.5–5.0)
Alkaline Phosphatase: 125 U/L (ref 38–126)
Anion gap: 11 (ref 5–15)
BUN: 7 mg/dL (ref 6–20)
CO2: 21 mmol/L — ABNORMAL LOW (ref 22–32)
Calcium: 9.4 mg/dL (ref 8.9–10.3)
Chloride: 103 mmol/L (ref 98–111)
Creatinine, Ser: 0.63 mg/dL (ref 0.44–1.00)
GFR calc Af Amer: >60 mL/min (ref >60)
GFR calc non Af Amer: >60 mL/min (ref >60)
Glucose, Bld: 143 mg/dL — ABNORMAL HIGH (ref 70–99)
Potassium: 3.5 mmol/L (ref 3.5–5.1)
Sodium: 135 mmol/L (ref 135–145)
Total Bilirubin: 0.4 mg/dL (ref 0.3–1.2)
Total Protein: 6.2 g/dL — ABNORMAL LOW (ref 6.5–8.1)

## 2019-06-13 LAB — URINALYSIS, ROUTINE W REFLEX MICROSCOPIC
Bilirubin Urine: NEGATIVE
Glucose, UA: NEGATIVE mg/dL
Hgb urine dipstick: NEGATIVE
Ketones, ur: NEGATIVE mg/dL
Leukocytes,Ua: NEGATIVE
Nitrite: NEGATIVE
Protein, ur: NEGATIVE mg/dL
Specific Gravity, Urine: 1.005 (ref 1.005–1.030)
pH: 7 (ref 5.0–8.0)

## 2019-06-13 NOTE — MAU Note (Addendum)
Pt here with complaints of HBP. Took labetalol and bASA. Checked BP around 9pm and has not had a BP lower than 150s/90s. She denies HA, visions changes, abnormal swelling, or RUQ pain. Has appointment today, but pt reports she feels anxious due to history of pre-e.

## 2019-06-13 NOTE — Progress Notes (Signed)
TELEHEALTH OBSTETRICS PRENATAL VIRTUAL VIDEO VISIT ENCOUNTER NOTE  Provider location: Center for [facility]   I connected with April Davenport on 06/13/19 at  3:30 PM EST by MyChart Video Encounter at home and verified that I am speaking with the correct person using two identifiers.   I discussed the limitations, risks, security and privacy concerns of performing an evaluation and management service virtually and the availability of in person appointments. I also discussed with the patient that there may be a patient responsible charge related to this service. The patient expressed understanding and agreed to proceed. Subjective:  April Davenport is a 29 y.o. [id number] at [redacted]w[redacted]d being seen today for ongoing prenatal care.  She is currently monitored for the following issues for this high-risk pregnancy and has Essential hypertension; History of kidney stones; Obesity, Class III, BMI 40-49.9 (morbid obesity) (HCC); Preexisting hypertension complicating pregnancy, antepartum; Supervision of high-risk pregnancy; History of low transverse cesarean section x 1; History of severe pre-eclampsia; Maternal morbid obesity, antepartum (HCC); Previous preterm delivery, antepartum; Low TSH level; and Thickening of nuchal fold of fetus on their problem list.  Patient reports no complaints.   .  .   . Denies any leaking of fluid.   The following portions of the patient's history were reviewed and updated as appropriate: allergies, current medications, past family history, past medical history, past social history, past surgical history and problem list.   Objective:  There were no vitals filed for this visit.  Fetal Status:           General:  Alert, oriented and cooperative. Patient is in no acute distress.  Respiratory: Normal respiratory effort, no problems with respiration noted  Mental Status: Normal mood and affect. Normal behavior. Normal judgment and thought content.  Rest of  physical exam deferred due to type of encounter  Imaging: [place] MFM FETAL BPP W/NONSTRESS  Result Date: 06/02/2019 ----------------------------------------------------------------------  OBSTETRICS REPORT                       (Signed Final 06/02/2019 09:45 am) ---------------------------------------------------------------------- Patient Info  ID #:       [id number]                          D.O.B.:  Jun 23, 1990 (28 yrs)  Name:       April Davenport                   Visit Date: 06/02/2019 07:52 am ---------------------------------------------------------------------- Performed By  Performed By:     Percell Boston          Ref. Address:     [address]  [place], [place]                                                             [address]  Attending:        Ma Rings MD         [facility]  Referred By:      Tereso Newcomer MD ---------------------------------------------------------------------- Orders   #  Description                          Code         Ordered By   1  [place] MFM FETAL BPP                     60454.0      RAVI [facility]      W/NONSTRESS  ----------------------------------------------------------------------   #  Order #                    Accession #                 Episode #   1  [id number]                  [id number]                  [id number]  ---------------------------------------------------------------------- Indications   Hypertension - Chronic/Pre-existing            O10.019   (Labetalol)   Obesity complicating pregnancy, third          O99.213   trimester (BMI 39)   [redacted] weeks gestation of pregnancy                Z3A.34   Encounter for other antenatal screening        Z36.2   follow-up   History of cesarean delivery, currently         O34.219   pregnant   Poor obstetric history: Previous preterm       O09.219   delivery, antepartum (32 wks due to severe   preeclampsia)   Poor obstetric history: Previous               O09.299   preeclampsia / eclampsia/gestational HTN   Low Risk NIPS   Negative Horizon  ---------------------------------------------------------------------- Vital Signs                                                 Height:        5'1" ---------------------------------------------------------------------- Fetal Evaluation  Num Of Fetuses:  1  Fetal Heart Rate(bpm):  137  Cardiac Activity:       Observed  Presentation:           Cephalic  Placenta:               Anterior  Amniotic Fluid  AFI FV:      Within normal limits  AFI Sum(cm)     %Tile       Largest Pocket(cm)  16.19           59          5.17  RUQ(cm)       RLQ(cm)       LUQ(cm)        LLQ(cm)  4.29          1.64          5.09           5.17 ---------------------------------------------------------------------- Biophysical Evaluation  Amniotic F.V:   Within normal limits       F. Tone:        Observed  F. Movement:    Observed                   N.S.T:          Reactive  F. Breathing:   Not Observed               Score:          8/10 ---------------------------------------------------------------------- OB History  Gravidity:    3         Term:   0        Prem:   1        SAB:   1  TOP:          0       Ectopic:  0        Living: 1 ---------------------------------------------------------------------- Gestational Age  LMP:           34w 1d        Date:  10/06/18                 EDD:   07/13/19  Best:          34w 1d     Det. By:  LMP  (10/06/18)          EDD:   07/13/19 ---------------------------------------------------------------------- Anatomy  Thoracic:              Appears normal         Bladder:                Appears normal  Stomach:               Appears normal, left                         sided  ---------------------------------------------------------------------- Cervix Uterus Adnexa  Cervix  Not visualized (advanced GA >24wks) ---------------------------------------------------------------------- Comments  The patient presented today for a biophysical profile due to a  history of chronic hypertension currently treated with  labetalol.  A biophysical profile performed today was 6 out of 8.  The  patient received -2 for absent fetal breathing movements.  The patient subsequently had a reactive nonstress test.  There was normal amniotic fluid noted on today's ultrasound  exam.  She will return in 1 week for another biophysical profile. ----------------------------------------------------------------------  Johnell Comings, MD Electronically Signed Final Report   06/02/2019 09:45 am ----------------------------------------------------------------------  [place] MFM FETAL BPP WO NON STRESS  Result Date: 06/09/2019 ----------------------------------------------------------------------  OBSTETRICS REPORT                       (Signed Final 06/09/2019 08:31 am) ---------------------------------------------------------------------- Patient Info  ID #:       [id number]                          D.O.B.:  June 06, 1991 (28 yrs)  Name:       [name]                   Visit Date: 06/09/2019 07:47 am ---------------------------------------------------------------------- Performed By  Performed By:     Georgie Chard        Ref. Address:     [address]                    [place]  Attending:        Tama High MD        [facility]  Referred By:      Osborne Oman MD  ---------------------------------------------------------------------- Orders   #  Description                          Code         Ordered By   1  [place] MFM FETAL BPP WO NON              40981.19     RAVI SHANKAR      STRESS  ----------------------------------------------------------------------   #  Order #                    Accession #  Episode #   1  [id number]                  [id number]                  [id number]  ---------------------------------------------------------------------- Indications   Hypertension - Chronic/Pre-existing            O10.019   (Labetalol)   Polyhydramnios, third trimester, antepartum    O40.3XX0   condition or complication, unspecified fetus   Obesity complicating pregnancy, third          O99.213   trimester (BMI 39)   History of cesarean delivery, currently        O34.219   pregnant   Poor obstetric history: Previous preterm       O09.219   delivery, antepartum (32 wks due to severe   preeclampsia)   Poor obstetric history: Previous               O09.299   preeclampsia / eclampsia/gestational HTN   Low Risk NIPS   Negative Horizon   [redacted] weeks gestation of pregnancy                Z3A.35  ---------------------------------------------------------------------- Vital Signs                                                 Height:        5'1" ---------------------------------------------------------------------- Fetal Evaluation  Num Of Fetuses:         1  Fetal Heart Rate(bpm):  143  Cardiac Activity:       Observed  Presentation:           Cephalic  Placenta:               Anterior  P. Cord Insertion:      Visualized  Amniotic Fluid  AFI FV:      Polyhydramnios  AFI Sum(cm)     %Tile       Largest Pocket(cm)  28.61           > 97        8.15  RUQ(cm)       RLQ(cm)       LUQ(cm)        LLQ(cm)  8.15          4.83          7.5            8.13 ---------------------------------------------------------------------- Biophysical Evaluation  Amniotic F.V:   Within normal limits        F. Tone:        Observed  F. Movement:    Observed                   Score:          8/8  F. Breathing:   Observed ---------------------------------------------------------------------- OB History  Gravidity:    3         Term:   0        Prem:   1        SAB:   1  TOP:          0       Ectopic:  0        Living: 1 ---------------------------------------------------------------------- Gestational Age  LMP:  35w 1d        Date:  10/06/18                 EDD:   07/13/19  Best:          35w 1d     Det. By:  LMP  (10/06/18)          EDD:   07/13/19 ---------------------------------------------------------------------- Anatomy  Cranium:               Previously seen        LVOT:                   Previously seen  Cavum:                 Previously seen        Aortic Arch:            Previously seen  Ventricles:            Previously seen        Ductal Arch:            Previously seen  Choroid Plexus:        Previously seen        Diaphragm:              Appears normal  Cerebellum:            Previously seen        Stomach:                Appears normal, left                                                                        sided  Posterior Fossa:       Previously seen        Abdomen:                Appears normal  Nuchal Fold:           Previously seen        Abdominal Wall:         Previously seen  Face:                  Appears normal         Cord Vessels:           Previously seen                         (orbits and profile)  Lips:                  Previously seen        Kidneys:                Appear normal  Palate:                Previously seen        Bladder:                Appears normal  Thoracic:              Appears normal         Spine:  Previously seen  Heart:                 Previously seen        Upper Extremities:      Previously seen  RVOT:                  Previously seen        Lower Extremities:      Previously seen  ---------------------------------------------------------------------- Impression  Chronic hypertension.  Well controlled on labetalol 100 mg  twice daily.  Obstetric history significant for previous cesarean  delivery and the patient is planning VBAC.  Mild polyhydramnios is seen.  Antenatal testing is reassuring.  BPP 8/8.  Patient does not have gestational diabetes.  I have reassured  the patient of the findings and explained that polyhydramnios  is usually idiopathic (no clear known cause).  In some cases, polyhydramnios can lead to labor. ---------------------------------------------------------------------- Recommendations  -Continue weekly BPP till delivery. ----------------------------------------------------------------------                  Noralee Spaceavi Shankar, MD Electronically Signed Final Report   06/09/2019 08:31 am ----------------------------------------------------------------------  US MFM FETAL BPP WO NON STRESS  Result Date: 05/24/2019 ----------------------------------------------------------------------  OBSTETRICS REPORT                       (Signed Final 05/24/2019 05:22 pm) ---------------------------------------------------------------------- Patient Info  ID #:       [id number]                          D.O.B.:  Jan 28, 1991 (28 yrs)  Name:       April LatinoNATALIA BROWN                   Visit Date: 05/24/2019 04:33 pm ---------------------------------------------------------------------- Performed By  Performed By:     Birdena CrandallYasemin Karatas        Ref. Address:     [address]                    [place]                                                             [address]                                                             [place], [place]                                                             [id number]  Attending:        Lin Landsmanorenthian Booker      [facility]                    MD  Fetal Care  Referred By:      Tereso Newcomer MD ---------------------------------------------------------------------- Orders   #  Description                          Code         Ordered By   1  [place] MFM FETAL BPP WO NON              81856.31     RAVI [facility]      STRESS  ----------------------------------------------------------------------   #  Order #                    Accession #                 Episode #   1  [id number]                  [id number]                  [id number]  ---------------------------------------------------------------------- Indications   Hypertension - Chronic/Pre-existing            O10.019   (Labetalol)   Obesity complicating pregnancy, third          O99.213   trimester (BMI 39)   Encounter for other antenatal screening        Z36.2   follow-up   History of cesarean delivery, currently        O34.219   pregnant   Poor obstetric history: Previous preterm       O09.219   delivery, antepartum (32 wks due to severe   preeclampsia)   Poor obstetric history: Previous               O09.299   preeclampsia / eclampsia/gestational HTN   Low Risk NIPS   Negative Horizon   [redacted] weeks gestation of pregnancy                Z3A.32  ---------------------------------------------------------------------- Vital Signs  Weight (lb): 232                               Height:        5'1"  BMI:         43.83 ---------------------------------------------------------------------- Fetal Evaluation  Num Of Fetuses:         1  Fetal Heart Rate(bpm):  149  Cardiac Activity:       Observed  Presentation:           Cephalic  Placenta:               Anterior  P. Cord Insertion:      Previously Visualized  Amniotic Fluid  AFI FV:      Subjectively upper-normal  AFI Sum(cm)     %Tile       Largest Pocket(cm)  19.93           75          5.74  RUQ(cm)       RLQ(cm)       LUQ(cm)        LLQ(cm)  4.27          5.72          5.74  4.2 ---------------------------------------------------------------------- Biophysical Evaluation  Amniotic F.V:   Within  normal limits       F. Tone:        Observed  F. Movement:    Observed                   Score:          8/8  F. Breathing:   Observed ---------------------------------------------------------------------- OB History  Gravidity:    3         Term:   0        Prem:   1        SAB:   1  TOP:          0       Ectopic:  0        Living: 1 ---------------------------------------------------------------------- Gestational Age  LMP:           32w 6d        Date:  10/06/18                 EDD:   07/13/19  Best:          Armida Sans 6d     Det. By:  LMP  (10/06/18)          EDD:   07/13/19 ---------------------------------------------------------------------- Anatomy  Cranium:               Appears normal         LVOT:                   Previously seen  Cavum:                 Previously seen        Aortic Arch:            Previously seen  Ventricles:            Appears normal         Ductal Arch:            Previously seen  Choroid Plexus:        Previously seen        Diaphragm:              Appears normal  Cerebellum:            Previously seen        Stomach:                Appears normal, left                                                                        sided  Posterior Fossa:       Previously seen        Abdomen:                Previously seen  Nuchal Fold:           previously             Abdominal Wall:         Previously seen  enlarged, 6.8 mm  Face:                  Orbits and profile     Cord Vessels:           Previously seen                         previously seen  Lips:                  Previously seen        Kidneys:                Appear normal  Palate:                Previously seen        Bladder:                Appears normal  Thoracic:              Previously seen        Spine:                  Limited views                                                                        previously seen  Heart:                 Previously seen        Upper Extremities:      Previously seen   RVOT:                  Previously seen        Lower Extremities:      Previously seen  Other:  Female gender previously seen. Technically difficult due to maternal          habitus and fetal position. ---------------------------------------------------------------------- Cervix Uterus Adnexa  Cervix  Not visualized (advanced GA >24wks) ---------------------------------------------------------------------- Impression  Biophysical profile 8/8  Chronic hypertension on labetalol  Low risk NIPS  Blood pressure normal today. Good fetal movement and  amniotic fluid. ---------------------------------------------------------------------- Recommendations  Continue weekly testing  Follow up growth in 3 weeks. ----------------------------------------------------------------------               Lin Landsmanorenthian Booker, MD Electronically Signed Final Report   05/24/2019 05:22 pm ----------------------------------------------------------------------  US MFM FETAL BPP WO NON STRESS  Result Date: 05/18/2019 ----------------------------------------------------------------------  OBSTETRICS REPORT                       (Signed Final 05/18/2019 08:54 am) ---------------------------------------------------------------------- Patient Info  ID #:       [id number]                          D.O.B.:  16-Apr-1991 (28 yrs)  Name:       April LatinoNATALIA BROWN                   Visit Date: 05/18/2019 08:00 am ---------------------------------------------------------------------- Performed By  Performed By:     Tommie RaymondMesha Tester BS,       Ref. Address:     [phone]  [address] [place]                                                             [address]                                                             [place], [place]                                                             [address]  Attending:        Noralee Space MD        [facility]  Referred By:       Tereso Newcomer MD ---------------------------------------------------------------------- Orders   #  Description                          Code         Ordered By   1  [place] MFM OB FOLLOW UP                  76816.01     YU FANG   2  [place] MFM FETAL BPP WO NON              76819.01     YU FANG      STRESS  ----------------------------------------------------------------------   #  Order #                    Accession #                 Episode #   1  [id number]                  [id number]                  [id number]   2  [id number]                  [id number]                  [id number]  ---------------------------------------------------------------------- Indications   [redacted] weeks gestation of pregnancy                Z3A.32  Hypertension - Chronic/Pre-existing            O10.019   (Labetalol)   Obesity complicating pregnancy, third          O99.213   trimester (BMI 39)   Encounter for other antenatal screening        Z36.2   follow-up   History of cesarean delivery, currently        O34.219   pregnant   Poor obstetric history: Previous preterm       O09.219   delivery, antepartum (32 wks due to severe   preeclampsia)   Poor obstetric history: Previous               O09.299   preeclampsia / eclampsia/gestational HTN   Low Risk NIPS   Negative Horizon  ---------------------------------------------------------------------- Vital Signs  Weight (lb): 211                               Height:        5'1"  BMI:         39.86 ---------------------------------------------------------------------- Fetal Evaluation  Num Of Fetuses:         1  Fetal Heart Rate(bpm):  149  Cardiac Activity:       Observed  Presentation:           Cephalic  Placenta:               Anterior  P. Cord Insertion:      Previously Visualized  Amniotic Fluid  AFI FV:      Within normal limits  AFI Sum(cm)     %Tile       Largest Pocket(cm)  15.74           56          5.93  RUQ(cm)       RLQ(cm)       LUQ(cm)        LLQ(cm)  4.01           3.24          2.56           5.93 ---------------------------------------------------------------------- Biophysical Evaluation  Amniotic F.V:   Within normal limits       F. Tone:        Observed  F. Movement:    Observed                   Score:          8/8  F. Breathing:   Observed ---------------------------------------------------------------------- Biometry  BPD:      85.9  mm     G. Age:  34w 4d         97  %    CI:        83.38   %    70 - 86                                                          FL/HC:      21.6   %    19.1 - 21.3  HC:      296.6  mm     G. Age:  32w 6d  33  %    HC/AC:      0.95        0.96 - 1.17  AC:      312.9  mm     G. Age:  35w 2d       > 99  %    FL/BPD:     74.6   %    71 - 87  FL:       64.1  mm     G. Age:  33w 1d         68  %    FL/AC:      20.5   %    20 - 24  HUM:      56.1  mm     G. Age:  32w 5d         62  %  CER:      42.3  mm     G. Age:  36w 2d       > 95  %  LV:        5.1  mm  Est. FW:    2406  gm      5 lb 5 oz     97  % ---------------------------------------------------------------------- OB History  Gravidity:    3         Term:   0        Prem:   1        SAB:   1  TOP:          0       Ectopic:  0        Living: 1 ---------------------------------------------------------------------- Gestational Age  LMP:           32w 0d        Date:  10/06/18                 EDD:   07/13/19  U/S Today:     34w 0d                                        EDD:   06/29/19  Best:          32w 0d     Det. By:  LMP  (10/06/18)          EDD:   07/13/19 ---------------------------------------------------------------------- Anatomy  Cranium:               Appears normal         LVOT:                   Previously seen  Cavum:                 Appears normal         Aortic Arch:            Previously seen  Ventricles:            Appears normal         Ductal Arch:            Previously seen  Choroid Plexus:        Previously seen        Diaphragm:              Appears normal   Cerebellum:  Appears normal         Stomach:                Appears normal, left                                                                        sided  Posterior Fossa:       Previously seen        Abdomen:                Previously seen  Nuchal Fold:           previously             Abdominal Wall:         Previously seen                         enlarged, 6.8 mm  Face:                  Orbits and profile     Cord Vessels:           Previously seen                         previously seen  Lips:                  Previously seen        Kidneys:                Appear normal  Palate:                Previously seen        Bladder:                Appears normal  Thoracic:              Previously seen        Spine:                  Limited views                                                                        appear normal  Heart:                 Previously seen        Upper Extremities:      Previously seen  RVOT:                  Previously seen        Lower Extremities:      Previously seen  Other:  Female gender previously seen. Technically difficult due to maternal          habitus and fetal position. ---------------------------------------------------------------------- Cervix Uterus Adnexa  Cervix  Not visualized (advanced GA >24wks)  Uterus  No abnormality visualized.  Left Ovary  Within normal limits. No adnexal  mass visualized.  Right Ovary  Within normal limits. No adnexal mass visualized.  Cul De Sac  No free fluid seen.  Adnexa  No abnormality visualized. ---------------------------------------------------------------------- Impression  Chronic hypertension.  Patient recently started labetalol and  her blood pressures are well controlled.  BP at our office  today is 120/75 mmHg.  On midtrimester fetal anatomy scan, nuchal fold thickness  was increased.  Subsequently fetal echocardiography  performed was reported as normal.  On today's ultrasound, the estimated fetal weight is at the  97th  percentile.  Amniotic fluid is normal good fetal activity  seen.  Antenatal testing is reassuring.  BPP 8/8.  Patient does not have gestational diabetes.  We reassured the patient of the findings.  Ultrasound has  limitations and accurately estimating fetal weights. ---------------------------------------------------------------------- Recommendations  -Continue weekly BPP till delivery. ----------------------------------------------------------------------                  Noralee Space, MD Electronically Signed Final Report   05/18/2019 08:54 am ----------------------------------------------------------------------  [place] MFM OB FOLLOW UP  Result Date: 05/18/2019 ----------------------------------------------------------------------  OBSTETRICS REPORT                       (Signed Final 05/18/2019 08:54 am) ---------------------------------------------------------------------- Patient Info  ID #:       [id number]                          D.O.B.:  [birth year] (28 yrs)  Name:       April Davenport                   Visit Date: 05/18/2019 08:00 am ---------------------------------------------------------------------- Performed By  Performed By:     Tommie Raymond BS,       Ref. Address:     [address], [address]                                                             [address]                                                             [place], [place]                                                             [address]  Attending:        Noralee Space MD        [facility]  Referred By:      Tereso Newcomer MD ---------------------------------------------------------------------- Orders   #  Description                          Code         Ordered By   1  [place] MFM OB FOLLOW UP                  76816.01     YU FANG   2  [place] MFM FETAL BPP WO NON              76819.01     YU FANG      STRESS   ----------------------------------------------------------------------   #  Order #                    Accession #                 Episode #   1  [id number]                  [id number]   2  [id number]                  [id number]                  [id number]  ---------------------------------------------------------------------- Indications   [redacted] weeks gestation of pregnancy                Z3A.32   Hypertension - Chronic/Pre-existing            O10.019   (Labetalol)   Obesity complicating pregnancy, third          O99.213   trimester (BMI 39)   Encounter for other antenatal screening        Z36.2   follow-up   History of cesarean delivery, currently        O34.219   pregnant   Poor obstetric history: Previous preterm       O09.219   delivery, antepartum (32 wks due to severe   preeclampsia)   Poor obstetric history: Previous               O09.299   preeclampsia / eclampsia/gestational HTN   Low Risk NIPS   Negative Horizon  ---------------------------------------------------------------------- Vital Signs  Weight (lb): 211                               Height:        5'1"  BMI:         39.86 ---------------------------------------------------------------------- Fetal Evaluation  Num Of Fetuses:         1  Fetal Heart Rate(bpm):  149  Cardiac Activity:       Observed  Presentation:           Cephalic  Placenta:               Anterior  P. Cord Insertion:      Previously Visualized  Amniotic Fluid  AFI FV:      Within normal limits  AFI Sum(cm)     %Tile       Largest  Pocket(cm)  15.74           56          5.93  RUQ(cm)       RLQ(cm)       LUQ(cm)        LLQ(cm)  4.01          3.24          2.56           5.93 ---------------------------------------------------------------------- Biophysical Evaluation  Amniotic F.V:   Within normal limits       F. Tone:        Observed  F. Movement:    Observed                   Score:          8/8  F. Breathing:   Observed  ---------------------------------------------------------------------- Biometry  BPD:      85.9  mm     G. Age:  34w 4d         97  %    CI:        83.38   %    70 - 86                                                          FL/HC:      21.6   %    19.1 - 21.3  HC:      296.6  mm     G. Age:  32w 6d         33  %    HC/AC:      0.95        0.96 - 1.17  AC:      312.9  mm     G. Age:  35w 2d       > 99  %    FL/BPD:     74.6   %    71 - 87  FL:       64.1  mm     G. Age:  33w 1d         68  %    FL/AC:      20.5   %    20 - 24  HUM:      56.1  mm     G. Age:  32w 5d         62  %  CER:      42.3  mm     G. Age:  36w 2d       > 95  %  LV:        5.1  mm  Est. FW:    2406  gm      5 lb 5 oz     97  % ---------------------------------------------------------------------- OB History  Gravidity:    3         Term:   0        Prem:   1        SAB:   1  TOP:          0       Ectopic:  0        Living: 1 ---------------------------------------------------------------------- Gestational Age  LMP:  32w 0d        Date:  10/06/18                 EDD:   07/13/19  U/S Today:     34w 0d                                        EDD:   06/29/19  Best:          32w 0d     Det. By:  LMP  (10/06/18)          EDD:   07/13/19 ---------------------------------------------------------------------- Anatomy  Cranium:               Appears normal         LVOT:                   Previously seen  Cavum:                 Appears normal         Aortic Arch:            Previously seen  Ventricles:            Appears normal         Ductal Arch:            Previously seen  Choroid Plexus:        Previously seen        Diaphragm:              Appears normal  Cerebellum:            Appears normal         Stomach:                Appears normal, left                                                                        sided  Posterior Fossa:       Previously seen        Abdomen:                Previously seen  Nuchal Fold:           previously              Abdominal Wall:         Previously seen                         enlarged, 6.8 mm  Face:                  Orbits and profile     Cord Vessels:           Previously seen                         previously seen  Lips:                  Previously seen        Kidneys:  Appear normal  Palate:                Previously seen        Bladder:                Appears normal  Thoracic:              Previously seen        Spine:                  Limited views                                                                        appear normal  Heart:                 Previously seen        Upper Extremities:      Previously seen  RVOT:                  Previously seen        Lower Extremities:      Previously seen  Other:  Female gender previously seen. Technically difficult due to maternal          habitus and fetal position. ---------------------------------------------------------------------- Cervix Uterus Adnexa  Cervix  Not visualized (advanced GA >24wks)  Uterus  No abnormality visualized.  Left Ovary  Within normal limits. No adnexal mass visualized.  Right Ovary  Within normal limits. No adnexal mass visualized.  Cul De Sac  No free fluid seen.  Adnexa  No abnormality visualized. ---------------------------------------------------------------------- Impression  Chronic hypertension.  Patient recently started labetalol and  her blood pressures are well controlled.  BP at our office  today is 120/75 mmHg.  On midtrimester fetal anatomy scan, nuchal fold thickness  was increased.  Subsequently fetal echocardiography  performed was reported as normal.  On today's ultrasound, the estimated fetal weight is at the  97th percentile.  Amniotic fluid is normal good fetal activity  seen.  Antenatal testing is reassuring.  BPP 8/8.  Patient does not have gestational diabetes.  We reassured the patient of the findings.  Ultrasound has  limitations and accurately estimating fetal weights.  ---------------------------------------------------------------------- Recommendations  -Continue weekly BPP till delivery. ----------------------------------------------------------------------                  Noralee Space, MD Electronically Signed Final Report   05/18/2019 08:54 am ----------------------------------------------------------------------   Assessment and Plan:  Pregnancy: W2N5621 at [redacted]w[redacted]d 1. History of severe pre-eclampsia  2. History of low transverse cesarean section x 1 - she wants a RLTCS and BTL  3. Supervision of high risk pregnancy in third trimester  4. Obesity, Class III, BMI 40-49.9 (morbid obesity) (HCC)  5. Preexisting hypertension complicating pregnancy, antepartum - on baby asa and labetalol 200 mg BID - she has weekly MFM BPPs  Preterm labor symptoms and general obstetric precautions including but not limited to vaginal bleeding, contractions, leaking of fluid and fetal movement were reviewed in detail with the patient. I discussed the assessment and treatment plan with the patient. The patient was provided an opportunity to ask questions and all were answered. The patient agreed with the plan and demonstrated an understanding of  the instructions. The patient was advised to call back or seek an in-person office evaluation/go to MAU at [facility] for any urgent or concerning symptoms. Please refer to After Visit Summary for other counseling recommendations.   I provided 10 minutes of face-to-face time during this encounter.  No follow-ups on file.  Future Appointments  Date Time Provider Department Center  06/16/2019  3:45 PM [facility] NURSE [facility]  06/16/2019  3:45 PM [facility] [place] 2 [facility]-M[facility] M[facility]-US  06/22/2019  3:00 PM Federico Flake, MD [facility]    Allie Bossier, MD [facility] for [facility], [facility]

## 2019-06-13 NOTE — MAU Provider Note (Signed)
History     CSN: [id number]  Arrival date and time: 06/13/19 0331   None     Chief Complaint  Patient presents with  . Hypertension   HPI  April Davenport is a 29 yo G3P0111 at 25.5EGA who is presenting for high blood pressure. She has cHTN and was put on labetalol 200 mg PO BID about 1 month ago. She has noticed that her blood pressures have been in the 150s/90s, even after taking her labetalol and baby aspirin, and is worried about pre-eclampsia. She denies headache, vision changes, shortness of breath, or right upper quadrant pain. She does have a history of Pre-E in a previous pregnancy.  OB History    Gravida  3   Para  1   Term      Preterm  1   AB  1   Living  1     SAB  1   TAB      Ectopic      Multiple      Live Births  1           Past Medical History:  Diagnosis Date  . Hypertension   . Right nephrolithiasis     Past Surgical History:  Procedure Laterality Date  . CESAREAN SECTION  05/16/2013  . KNEE SURGERY Right     Family History  Problem Relation Age of Onset  . Diabetes Mother   . Heart attack Father   . Diabetes Paternal Grandfather     Social History   Tobacco Use  . Smoking status: Never Smoker  . Smokeless tobacco: Never Used  Substance Use Topics  . Alcohol use: Not Currently    Comment: rare  . Drug use: Never    Allergies:  Allergies  Allergen Reactions  . Dilaudid [Hydromorphone Hcl] Hives, Shortness Of Breath and Itching    Syncope  . Black [employer] and Swelling    Medications Prior to Admission  Medication Sig Dispense Refill Last Dose  . aspirin EC 81 MG tablet Take 1 tablet (81 mg total) by mouth daily. Take after 12 weeks for prevention of preeclampsia later in pregnancy 300 tablet 2 06/12/2019 at 2100  . labetalol (NORMODYNE) 100 MG tablet Take 1 tablet (100 mg total) by mouth 2 (two) times daily. 60 tablet 2 06/12/2019 at 2100  . Prenatal Vit-Fe Fumarate-FA (PRENATAL MULTIVITAMIN) TABS tablet Take 1  tablet by mouth daily at 12 noon.   06/12/2019 at 1100  . acetaminophen (TYLENOL) 325 MG tablet Take 650 mg by mouth every 6 (six) hours as needed.     . Blood Pressure Monitoring (BLOOD PRESSURE CUFF) MISC 1 Device by Does not apply route once a week. To be monitored weekly 1 each 0   . metroNIDAZOLE (FLAGYL) 500 MG tablet Take 1 tablet (500 mg total) by mouth 2 (two) times daily. (Patient not taking: Reported on 06/08/2019) 14 tablet 0     Review of Systems  All other systems reviewed and are negative.  Physical Exam   Blood pressure 137/84, pulse 93, temperature 98.4 F (36.9 C), resp. rate 18, height 5\' 1"  (1.549 m), weight 111.3 kg, last menstrual period 10/06/2018, SpO2 98 %.  Physical Exam  Nursing note and vitals reviewed. Constitutional: She is oriented to person, place, and time. She appears well-developed and well-nourished.  HENT:  Head: Normocephalic and atraumatic.  Eyes: Pupils are equal, round, and reactive to light. Conjunctivae and EOM are normal.  Cardiovascular: Normal rate, regular rhythm, normal  heart sounds and intact distal pulses.  Respiratory: Effort normal and breath sounds normal.  GI: Soft. Bowel sounds are normal.  gravid  Musculoskeletal:        General: Normal range of motion.     Cervical back: Normal range of motion.  Neurological: She is alert and oriented to person, place, and time. She has normal reflexes.  Skin: Skin is warm and dry.  Psychiatric: She has a normal mood and affect. Her behavior is normal. Judgment and thought content normal.    MAU Course  Procedures  MDM - BPs elevated, but not severe range - No other signs/symptoms of Pre-E - CMP, CBC, UA and P:Cr ordered to rule out Pre-E  Results for orders placed or performed during the hospital encounter of 06/13/19 (from the past 24 hour(s))  Urinalysis, Routine w reflex microscopic     Status: Abnormal   Collection Time: 06/13/19  4:04 AM  Result Value Ref Range   Color, Urine  STRAW (A) YELLOW   APPearance CLEAR CLEAR   Specific Gravity, Urine 1.005 1.005 - 1.030   pH 7.0 5.0 - 8.0   Glucose, UA NEGATIVE NEGATIVE mg/dL   Hgb urine dipstick NEGATIVE NEGATIVE   Bilirubin Urine NEGATIVE NEGATIVE   Ketones, ur NEGATIVE NEGATIVE mg/dL   Protein, ur NEGATIVE NEGATIVE mg/dL   Nitrite NEGATIVE NEGATIVE   Leukocytes,Ua NEGATIVE NEGATIVE  Protein / creatinine ratio, urine     Status: None   Collection Time: 06/13/19  4:04 AM  Result Value Ref Range   Creatinine, Urine 42.35 mg/dL   Total Protein, Urine <6 mg/dL   Protein Creatinine Ratio        0.00 - 0.15 mg/mg[Cre]  Comprehensive metabolic panel     Status: Abnormal   Collection Time: 06/13/19  4:13 AM  Result Value Ref Range   Sodium 135 135 - 145 mmol/L   Potassium 3.5 3.5 - 5.1 mmol/L   Chloride 103 98 - 111 mmol/L   CO2 21 (L) 22 - 32 mmol/L   Glucose, Bld 143 (H) 70 - 99 mg/dL   BUN 7 6 - 20 mg/dL   Creatinine, Ser 9.76 0.44 - 1.00 mg/dL   Calcium 9.4 8.9 - 73.4 mg/dL   Total Protein 6.2 (L) 6.5 - 8.1 g/dL   Albumin 2.4 (L) 3.5 - 5.0 g/dL   AST 19 15 - 41 U/L   ALT 14 0 - 44 U/L   Alkaline Phosphatase 125 38 - 126 U/L   Total Bilirubin 0.4 0.3 - 1.2 mg/dL   GFR calc non Af Amer >60 >60 mL/min   GFR calc Af Amer >60 >60 mL/min   Anion gap 11 5 - 15  CBC     Status: Abnormal   Collection Time: 06/13/19  4:13 AM  Result Value Ref Range   WBC 7.6 4.0 - 10.5 K/uL   RBC 4.02 3.87 - 5.11 MIL/uL   Hemoglobin 11.0 (L) 12.0 - 15.0 g/dL   HCT 19.3 (L) 79.0 - 24.0 %   MCV 86.6 80.0 - 100.0 fL   MCH 27.4 26.0 - 34.0 pg   MCHC 31.6 30.0 - 36.0 g/dL   RDW 97.3 53.2 - 99.2 %   Platelets 286 150 - 400 K/uL   nRBC 0.0 0.0 - 0.2 %     Assessment and Plan  29 yo E2A8341 at 35.5 EGA who presented to MAU for high blood pressures in the context of chronic hypertension on labetalol 200 mg BID -No severe  range Bps -Pre-E labs normal -Has OB appointment today, would rather her OB adjust her medications -DC to  home with return precautions  April Davenport L April Davenport 06/13/2019, 5:28 AM

## 2019-06-13 NOTE — Progress Notes (Signed)
Was seen in MAU last night for BP.

## 2019-06-13 NOTE — Progress Notes (Signed)
I have reviewed this chart and agree with the RN/CMA assessment and management.    Talayeh Bruinsma C Phil Michels, MD, FACOG Attending Physician, [facility]  

## 2019-06-13 NOTE — Discharge Instructions (Signed)

## 2019-06-13 NOTE — Progress Notes (Signed)
Subjective:  April Davenport is a 29 y.o. female here for BP check.   Hypertension ROS. Taken medication as prescribed. She has no complaints of visual changes,chest pain or headache at this time.    Objective:  BP 129/83   Pulse 79   LMP 10/06/2018 (Approximate)   Appearance "alert, well appearing, and in no distress. General exam BP noted to be well controlled today in office.    Assessment:   Blood Pressure.  Blood pressure is within normal range at this time.   Plan:  Patient was supposed follow up with an MD today but was scheduled at the wrong time. Will put her in for a virtual visit today to discuss her medication and blood pressure.

## 2019-06-16 ENCOUNTER — Encounter (HOSPITAL_COMMUNITY): Payer: Self-pay

## 2019-06-16 ENCOUNTER — Ambulatory Visit (HOSPITAL_COMMUNITY): Payer: Federal, State, Local not specified - PPO | Admitting: *Deleted

## 2019-06-16 ENCOUNTER — Other Ambulatory Visit: Payer: Self-pay

## 2019-06-16 ENCOUNTER — Ambulatory Visit (HOSPITAL_COMMUNITY)
Admission: RE | Admit: 2019-06-16 | Discharge: 2019-06-16 | Disposition: A | Discharge status: Home / Self Care | Payer: Federal, State, Local not specified - PPO | Source: Ambulatory Visit | Attending: Obstetrics and Gynecology | Admitting: Obstetrics and Gynecology

## 2019-06-16 DIAGNOSIS — O10013 Pre-existing essential hypertension complicating pregnancy, third trimester: Secondary | ICD-10-CM | POA: Diagnosis not present

## 2019-06-16 DIAGNOSIS — O09213 Supervision of pregnancy with history of pre-term labor, third trimester: Secondary | ICD-10-CM

## 2019-06-16 DIAGNOSIS — O99210 Obesity complicating pregnancy, unspecified trimester: Secondary | ICD-10-CM | POA: Insufficient documentation

## 2019-06-16 DIAGNOSIS — O403XX0 Polyhydramnios, third trimester, not applicable or unspecified: Secondary | ICD-10-CM

## 2019-06-16 DIAGNOSIS — Z3A36 36 weeks gestation of pregnancy: Secondary | ICD-10-CM

## 2019-06-16 DIAGNOSIS — O10913 Unspecified pre-existing hypertension complicating pregnancy, third trimester: Secondary | ICD-10-CM

## 2019-06-16 DIAGNOSIS — O34219 Maternal care for unspecified type scar from previous cesarean delivery: Secondary | ICD-10-CM | POA: Diagnosis not present

## 2019-06-16 DIAGNOSIS — Z362 Encounter for other antenatal screening follow-up: Secondary | ICD-10-CM | POA: Diagnosis not present

## 2019-06-16 DIAGNOSIS — O99213 Obesity complicating pregnancy, third trimester: Secondary | ICD-10-CM

## 2019-06-17 ENCOUNTER — Other Ambulatory Visit (HOSPITAL_COMMUNITY): Payer: Self-pay | Admitting: *Deleted

## 2019-06-17 DIAGNOSIS — O10913 Unspecified pre-existing hypertension complicating pregnancy, third trimester: Secondary | ICD-10-CM

## 2019-06-21 ENCOUNTER — Encounter: Payer: Self-pay | Admitting: Radiology

## 2019-06-21 ENCOUNTER — Encounter: Payer: Self-pay | Admitting: Obstetrics and Gynecology

## 2019-06-21 ENCOUNTER — Other Ambulatory Visit (HOSPITAL_COMMUNITY)
Admission: RE | Admit: 2019-06-21 | Discharge: 2019-06-21 | Disposition: A | Discharge status: Home / Self Care | Payer: Federal, State, Local not specified - PPO | Source: Ambulatory Visit | Attending: Obstetrics and Gynecology | Admitting: Obstetrics and Gynecology

## 2019-06-21 ENCOUNTER — Ambulatory Visit (INDEPENDENT_AMBULATORY_CARE_PROVIDER_SITE_OTHER): Payer: Federal, State, Local not specified - PPO | Admitting: Obstetrics and Gynecology

## 2019-06-21 ENCOUNTER — Other Ambulatory Visit: Payer: Self-pay

## 2019-06-21 VITALS — BP 127/82 | HR 82 | Wt 245.0 lb

## 2019-06-21 DIAGNOSIS — O09219 Supervision of pregnancy with history of pre-term labor, unspecified trimester: Secondary | ICD-10-CM

## 2019-06-21 DIAGNOSIS — Z3009 Encounter for other general counseling and advice on contraception: Secondary | ICD-10-CM | POA: Insufficient documentation

## 2019-06-21 DIAGNOSIS — Z8759 Personal history of other complications of pregnancy, childbirth and the puerperium: Secondary | ICD-10-CM

## 2019-06-21 DIAGNOSIS — O10913 Unspecified pre-existing hypertension complicating pregnancy, third trimester: Secondary | ICD-10-CM

## 2019-06-21 DIAGNOSIS — O351XX0 Maternal care for (suspected) chromosomal abnormality in fetus, not applicable or unspecified: Secondary | ICD-10-CM

## 2019-06-21 DIAGNOSIS — O0993 Supervision of high risk pregnancy, unspecified, third trimester: Secondary | ICD-10-CM | POA: Diagnosis not present

## 2019-06-21 DIAGNOSIS — O09213 Supervision of pregnancy with history of pre-term labor, third trimester: Secondary | ICD-10-CM

## 2019-06-21 DIAGNOSIS — IMO0002 Reserved for concepts with insufficient information to code with codable children: Secondary | ICD-10-CM

## 2019-06-21 DIAGNOSIS — Z98891 History of uterine scar from previous surgery: Secondary | ICD-10-CM

## 2019-06-21 DIAGNOSIS — Z3A36 36 weeks gestation of pregnancy: Secondary | ICD-10-CM

## 2019-06-21 DIAGNOSIS — O10919 Unspecified pre-existing hypertension complicating pregnancy, unspecified trimester: Secondary | ICD-10-CM

## 2019-06-21 NOTE — Progress Notes (Signed)
   PRENATAL VISIT NOTE  Subjective:  April Davenport is a 29 y.o. D5H2992 at [redacted]w[redacted]d being seen today for ongoing prenatal care.  She is currently monitored for the following issues for this high-risk pregnancy and has Essential hypertension; History of kidney stones; Obesity, Class III, BMI 40-49.9 (morbid obesity) (HCC); Preexisting hypertension complicating pregnancy, antepartum; Supervision of high-risk pregnancy; History of low transverse cesarean section x 1; History of severe pre-eclampsia; Maternal morbid obesity, antepartum (HCC); Previous preterm delivery, antepartum; Low TSH level; Thickening of nuchal fold of fetus; and Unwanted fertility on their problem list.  Patient reports occasional contractions, daily, but not consistent.  Contractions: Irritability.  .  Movement: Present. Denies leaking of fluid.   The following portions of the patient's history were reviewed and updated as appropriate: allergies, current medications, past family history, past medical history, past social history, past surgical history and problem list.   Objective:   Vitals:   06/21/19 1312  BP: 127/82  Pulse: 82  Weight: 245 lb (111.1 kg)    Fetal Status: Fetal Heart Rate (bpm): 145   Movement: Present     General:  Alert, oriented and cooperative. Patient is in no acute distress.  Skin: Skin is warm and dry. No rash noted.   Cardiovascular: Normal heart rate noted  Respiratory: Normal respiratory effort, no problems with respiration noted  Abdomen: Soft, gravid, appropriate for gestational age.  Pain/Pressure: Present     Pelvic: Cervical exam deferred        Extremities: Normal range of motion.  Edema: Trace  Mental Status: Normal mood and affect. Normal behavior. Normal judgment and thought content.   Assessment and Plan:  Pregnancy: E2A8341 at [redacted]w[redacted]d  1. Supervision of high risk pregnancy in third trimester - Culture, beta strep (group b only) - Cervicovaginal ancillary only( [facility]) Reviewed hospital policies regarding visitors, masking, COVID testing, etc.  2. History of low transverse cesarean section x 1 Patient desires RCS+BTL  3. History of severe pre-eclampsia BP stable  4. Previous preterm delivery, antepartum Cont baby ASA  5. Thickening of nuchal fold of fetus Declined amnio  6. Preexisting hypertension complicating pregnancy, antepartum Cont labetalol 200 mg BID Per MFM recommendations, delivery @ 38 weeks RCS requested for 38 weeks  7. Unwanted fertility For BTL   Preterm labor symptoms and general obstetric precautions including but not limited to vaginal bleeding, contractions, leaking of fluid and fetal movement were reviewed in detail with the patient. Please refer to After Visit Summary for other counseling recommendations.   Return in about 1 week (around 06/28/2019) for high OB, virtual.  Future Appointments  Date Time Provider Department Center  06/24/2019  3:40 PM [facility] NURSE [facility]  06/24/2019  3:45 PM [facility] [place] 2 [facility] M[facility]-US    Conan Bowens, MD

## 2019-06-22 ENCOUNTER — Encounter: Payer: Federal, State, Local not specified - PPO | Admitting: Family Medicine

## 2019-06-23 ENCOUNTER — Telehealth: Payer: Self-pay | Admitting: *Deleted

## 2019-06-23 LAB — CERVICOVAGINAL ANCILLARY ONLY
Chlamydia: NEGATIVE
Comment: NEGATIVE
Comment: NORMAL
Neisseria Gonorrhea: NEGATIVE

## 2019-06-23 NOTE — Telephone Encounter (Signed)
Called pt to inform her of her c section is scheduled for 1/21/2021at 12:30pm.

## 2019-06-24 ENCOUNTER — Encounter (HOSPITAL_COMMUNITY): Payer: Self-pay | Admitting: Obstetrics and Gynecology

## 2019-06-24 ENCOUNTER — Ambulatory Visit (HOSPITAL_COMMUNITY): Admission: RE | Admit: 2019-06-24 | Payer: Federal, State, Local not specified - PPO | Source: Ambulatory Visit

## 2019-06-24 ENCOUNTER — Inpatient Hospital Stay (HOSPITAL_COMMUNITY)
Admission: AD | Admit: 2019-06-24 | Discharge: 2019-06-24 | Disposition: A | Discharge status: Home / Self Care | Payer: Federal, State, Local not specified - PPO | Attending: Obstetrics and Gynecology | Admitting: Obstetrics and Gynecology

## 2019-06-24 ENCOUNTER — Encounter (HOSPITAL_COMMUNITY): Payer: Self-pay

## 2019-06-24 ENCOUNTER — Ambulatory Visit (HOSPITAL_COMMUNITY): Payer: Federal, State, Local not specified - PPO | Attending: Obstetrics and Gynecology

## 2019-06-24 ENCOUNTER — Other Ambulatory Visit: Payer: Self-pay

## 2019-06-24 ENCOUNTER — Inpatient Hospital Stay (HOSPITAL_BASED_OUTPATIENT_CLINIC_OR_DEPARTMENT_OTHER): Payer: Federal, State, Local not specified - PPO

## 2019-06-24 DIAGNOSIS — Z8249 Family history of ischemic heart disease and other diseases of the circulatory system: Secondary | ICD-10-CM | POA: Insufficient documentation

## 2019-06-24 DIAGNOSIS — O99210 Obesity complicating pregnancy, unspecified trimester: Secondary | ICD-10-CM

## 2019-06-24 DIAGNOSIS — Z885 Allergy status to narcotic agent status: Secondary | ICD-10-CM | POA: Diagnosis not present

## 2019-06-24 DIAGNOSIS — O10013 Pre-existing essential hypertension complicating pregnancy, third trimester: Secondary | ICD-10-CM

## 2019-06-24 DIAGNOSIS — Z3689 Encounter for other specified antenatal screening: Secondary | ICD-10-CM

## 2019-06-24 DIAGNOSIS — O403XX0 Polyhydramnios, third trimester, not applicable or unspecified: Secondary | ICD-10-CM

## 2019-06-24 DIAGNOSIS — Z3A37 37 weeks gestation of pregnancy: Secondary | ICD-10-CM

## 2019-06-24 DIAGNOSIS — O10913 Unspecified pre-existing hypertension complicating pregnancy, third trimester: Secondary | ICD-10-CM | POA: Diagnosis not present

## 2019-06-24 DIAGNOSIS — Z91018 Allergy to other foods: Secondary | ICD-10-CM | POA: Insufficient documentation

## 2019-06-24 DIAGNOSIS — O34219 Maternal care for unspecified type scar from previous cesarean delivery: Secondary | ICD-10-CM | POA: Insufficient documentation

## 2019-06-24 DIAGNOSIS — Z833 Family history of diabetes mellitus: Secondary | ICD-10-CM | POA: Diagnosis not present

## 2019-06-24 DIAGNOSIS — O10919 Unspecified pre-existing hypertension complicating pregnancy, unspecified trimester: Secondary | ICD-10-CM

## 2019-06-24 DIAGNOSIS — O99891 Other specified diseases and conditions complicating pregnancy: Secondary | ICD-10-CM | POA: Diagnosis not present

## 2019-06-24 DIAGNOSIS — R110 Nausea: Secondary | ICD-10-CM | POA: Diagnosis not present

## 2019-06-24 DIAGNOSIS — O99213 Obesity complicating pregnancy, third trimester: Secondary | ICD-10-CM | POA: Diagnosis not present

## 2019-06-24 DIAGNOSIS — O09293 Supervision of pregnancy with other poor reproductive or obstetric history, third trimester: Secondary | ICD-10-CM

## 2019-06-24 LAB — CULTURE, BETA STREP (GROUP B ONLY): Strep Gp B Culture: NEGATIVE

## 2019-06-24 LAB — URINALYSIS, ROUTINE W REFLEX MICROSCOPIC
Bilirubin Urine: NEGATIVE
Glucose, UA: NEGATIVE mg/dL
Hgb urine dipstick: NEGATIVE
Ketones, ur: NEGATIVE mg/dL
Leukocytes,Ua: NEGATIVE
Nitrite: NEGATIVE
Protein, ur: NEGATIVE mg/dL
Specific Gravity, Urine: 1.008 (ref 1.005–1.030)
pH: 7 (ref 5.0–8.0)

## 2019-06-24 LAB — CBC
HCT: 33.4 % — ABNORMAL LOW (ref 36.0–46.0)
Hemoglobin: 10.8 g/dL — ABNORMAL LOW (ref 12.0–15.0)
MCH: 27.4 pg (ref 26.0–34.0)
MCHC: 32.3 g/dL (ref 30.0–36.0)
MCV: 84.8 fL (ref 80.0–100.0)
Platelets: 256 10*3/uL (ref 150–400)
RBC: 3.94 MIL/uL (ref 3.87–5.11)
RDW: 13.5 % (ref 11.5–15.5)
WBC: 7.8 10*3/uL (ref 4.0–10.5)
nRBC: 0 % (ref 0.0–0.2)

## 2019-06-24 LAB — PROTEIN / CREATININE RATIO, URINE
Creatinine, Urine: 57.44 mg/dL
Protein Creatinine Ratio: 0.12 mg/mg{Cre} (ref 0.00–0.15)
Total Protein, Urine: 7 mg/dL

## 2019-06-24 LAB — COMPREHENSIVE METABOLIC PANEL
ALT: 13 U/L (ref 0–44)
AST: 16 U/L (ref 15–41)
Albumin: 2.3 g/dL — ABNORMAL LOW (ref 3.5–5.0)
Alkaline Phosphatase: 134 U/L — ABNORMAL HIGH (ref 38–126)
Anion gap: 11 (ref 5–15)
BUN: 6 mg/dL (ref 6–20)
CO2: 21 mmol/L — ABNORMAL LOW (ref 22–32)
Calcium: 9 mg/dL (ref 8.9–10.3)
Chloride: 104 mmol/L (ref 98–111)
Creatinine, Ser: 0.61 mg/dL (ref 0.44–1.00)
GFR calc Af Amer: >60 mL/min (ref >60)
GFR calc non Af Amer: >60 mL/min (ref >60)
Glucose, Bld: 138 mg/dL — ABNORMAL HIGH (ref 70–99)
Potassium: 3.6 mmol/L (ref 3.5–5.1)
Sodium: 136 mmol/L (ref 135–145)
Total Bilirubin: 0.5 mg/dL (ref 0.3–1.2)
Total Protein: 5.9 g/dL — ABNORMAL LOW (ref 6.5–8.1)

## 2019-06-24 MED ORDER — ACETAMINOPHEN 500 MG PO TABS
1000.0000 mg | ORAL_TABLET | Freq: Once | ORAL | Status: AC
  Administered 2019-06-24: 1000 mg via ORAL
  Filled 2019-06-24: qty 2

## 2019-06-24 NOTE — MAU Note (Signed)
Had [place] scheduled at 3, called them to see if she could get it earlier because she is nauseated. Office told her to just come here. "we would do her BPP and [place]"

## 2019-06-24 NOTE — Discharge Instructions (Signed)

## 2019-06-24 NOTE — MAU Provider Note (Signed)
History     CSN: [id number]  Arrival date and time: 06/24/19 1222   First Provider Initiated Contact with Patient 06/24/19 1304      Chief Complaint  Patient presents with  . Nausea   HPI April Davenport is a 29 y.o. [id number] at [redacted]w[redacted]d who presents to MAU requesting a Biophysical Profile. Patient denies vaginal bleeding, leaking of fluid, decreased fetal movement, fever, falls, or recent illness.   BPP Patient signed in to MAU with complaint of nausea but denies it upon CNM initial assessment. She states today was her last day of work before maternity leave, she had a BPP at [facility] scheduled for 3pm. Due to fatigue and wanting to avoid a long drive back to [facility] once she was home, she called clinic to see if she could move her appointment to an earlier time. She says "[name]" at [facility] said she could have her BPP performed in MAU instead of waiting for her scheduled appointment.  Headache Patient endorses new onset mild anterior headache, which she rates as 2/10. Onset about 90 minutes before her arrival in MAU. Her pain does not radiate. She endorses using computer screens throughout her work day and believes eye strain may be responsible for her pain. She has not taken medication for this complaint.  Chronic Hypertension Patient's pregnancy is complicated by Chronic Hypertension for which she takes Labetalol 100mg  BID. She took her morning dose with her daily ASA 81mg  at 0645 today. She typically takes her second Labetalol around 6pm.  She denies visual disturbances, RUQ/epigastric pain, new onset swelling or weight gain.  She denies vaginal bleeding, leaking of fluid, decreased fetal movement, fever, falls, or recent illness.   OB History    Gravida  3   Para  1   Term      Preterm  1   AB  1   Living  1     SAB  1   TAB      Ectopic      Multiple      Live Births  1           Past Medical History:  Diagnosis Date  . Hypertension   . Right nephrolithiasis      Past Surgical History:  Procedure Laterality Date  . CESAREAN SECTION  05/16/2013  . KNEE SURGERY Right     Family History  Problem Relation Age of Onset  . Diabetes Mother   . Heart attack Father   . Diabetes Paternal Grandfather     Social History   Tobacco Use  . Smoking status: Never Smoker  . Smokeless tobacco: Never Used  Substance Use Topics  . Alcohol use: Not Currently    Comment: rare  . Drug use: Never    Allergies:  Allergies  Allergen Reactions  . Dilaudid [Hydromorphone Hcl] Hives, Shortness Of Breath and Itching    Syncope  . Black and Swelling    No medications prior to admission.    Review of Systems  Constitutional: Negative for chills, fatigue and fever.  Eyes: Negative for visual disturbance.  Respiratory: Negative for shortness of breath.   Gastrointestinal: Negative for abdominal pain.  Genitourinary: Negative for vaginal bleeding.  Musculoskeletal: Negative for back pain.  Neurological: Positive for headaches. Negative for syncope and weakness.  All other systems reviewed and are negative.  Physical Exam   Blood pressure 140/85, pulse (!) 101, temperature 97.6 F (36.4 C), temperature source Oral, resp. rate 18, height  5\' 1"  (1.549 m), weight 112.6 kg, last menstrual period 10/06/2018, SpO2 100 %.  Physical Exam  Nursing note and vitals reviewed. Constitutional: She is oriented to person, place, and time. She appears well-developed and well-nourished.  Cardiovascular: Normal rate.  Respiratory: Effort normal and breath sounds normal.  GI: There is no abdominal tenderness. There is no rebound and no guarding.  Gravid  Neurological: She is alert and oriented to person, place, and time.  Skin: Skin is warm and dry.  Psychiatric: She has a normal mood and affect. Her behavior is normal. Judgment and thought content normal.    MAU Course/MDM  Procedures  --Reactive tracing: baseline 135, mod var, pos accels,  no decels --Toco: quiet --BPP 8/8 --Headache score unchanged one hour after PO Tylenol, patient denied pain following BPP  Patient Vitals for the past 24 hrs:  BP Temp Temp src Pulse Resp SpO2 Height Weight  06/24/19 1526 140/85 -- -- -- -- -- -- --  06/24/19 1435 -- -- -- -- -- 100 % -- --  06/24/19 1430 (!) 148/93 -- -- (!) 101 -- 99 % -- --  06/24/19 1425 -- -- -- -- -- 100 % -- --  06/24/19 1420 -- -- -- -- -- 99 % -- --  06/24/19 1415 -- -- -- -- -- 99 % -- --  06/24/19 1410 -- -- -- -- -- 99 % -- --  06/24/19 1405 -- -- -- -- -- 98 % -- --  06/24/19 1400 -- -- -- -- -- 99 % -- --  06/24/19 1355 -- -- -- -- -- 99 % -- --  06/24/19 1350 -- -- -- -- -- 99 % -- --  06/24/19 1345 (!) 140/95 -- -- (!) 102 -- 99 % -- --  06/24/19 1340 -- -- -- -- -- 99 % -- --  06/24/19 1335 -- -- -- -- -- 99 % -- --  06/24/19 1330 136/90 -- -- (!) 102 -- 99 % -- --  06/24/19 1325 -- -- -- -- -- 99 % -- --  06/24/19 1320 -- -- -- -- -- 99 % -- --  06/24/19 1315 137/87 -- -- (!) 101 -- 99 % -- --  06/24/19 1310 -- -- -- -- -- 100 % -- --  06/24/19 1305 -- -- -- -- -- 99 % -- --  06/24/19 1300 (!) 139/92 -- -- 100 -- 100 % -- --  06/24/19 1232 (!) 146/95 97.6 F (36.4 C) Oral 95 18 100 % 5\' 1"  (1.549 m) 112.6 kg   Results for orders placed or performed during the hospital encounter of 06/24/19 (from the past 24 hour(s))  Urinalysis, Routine w reflex microscopic     Status: None   Collection Time: 06/24/19 12:49 PM  Result Value Ref Range   Color, Urine YELLOW YELLOW   APPearance CLEAR CLEAR   Specific Gravity, Urine 1.008 1.005 - 1.030   pH 7.0 5.0 - 8.0   Glucose, UA NEGATIVE NEGATIVE mg/dL   Hgb urine dipstick NEGATIVE NEGATIVE   Bilirubin Urine NEGATIVE NEGATIVE   Ketones, ur NEGATIVE NEGATIVE mg/dL   Protein, ur NEGATIVE NEGATIVE mg/dL   Nitrite NEGATIVE NEGATIVE   Leukocytes,Ua NEGATIVE NEGATIVE  Protein / creatinine ratio, urine     Status: None   Collection Time: 06/24/19 12:49 PM   Result Value Ref Range   Creatinine, Urine 57.44 mg/dL   Total Protein, Urine 7 mg/dL   Protein Creatinine Ratio 0.12 0.00 - 0.15 mg/mg[Cre]  CBC  Status: Abnormal   Collection Time: 06/24/19  1:31 PM  Result Value Ref Range   WBC 7.8 4.0 - 10.5 K/uL   RBC 3.94 3.87 - 5.11 MIL/uL   Hemoglobin 10.8 (L) 12.0 - 15.0 g/dL   HCT 33.4 (L) 36.0 - 46.0 %   MCV 84.8 80.0 - 100.0 fL   MCH 27.4 26.0 - 34.0 pg   MCHC 32.3 30.0 - 36.0 g/dL   RDW 13.5 11.5 - 15.5 %   Platelets 256 150 - 400 K/uL   nRBC 0.0 0.0 - 0.2 %  Comprehensive metabolic panel     Status: Abnormal   Collection Time: 06/24/19  1:31 PM  Result Value Ref Range   Sodium 136 135 - 145 mmol/L   Potassium 3.6 3.5 - 5.1 mmol/L   Chloride 104 98 - 111 mmol/L   CO2 21 (L) 22 - 32 mmol/L   Glucose, Bld 138 (H) 70 - 99 mg/dL   BUN 6 6 - 20 mg/dL   Creatinine, Ser 0.61 0.44 - 1.00 mg/dL   Calcium 9.0 8.9 - 10.3 mg/dL   Total Protein 5.9 (L) 6.5 - 8.1 g/dL   Albumin 2.3 (L) 3.5 - 5.0 g/dL   AST 16 15 - 41 U/L   ALT 13 0 - 44 U/L   Alkaline Phosphatase 134 (H) 38 - 126 U/L   Total Bilirubin 0.5 0.3 - 1.2 mg/dL   GFR calc non Af Amer >60 >60 mL/min   GFR calc Af Amer >60 >60 mL/min   Anion gap 11 5 - 15   Meds ordered this encounter  Medications  . acetaminophen (TYLENOL) tablet 1,000 mg   Assessment and Plan  --29 y.o. [id number] at [redacted]w[redacted]d  --Chronic Hypertension, normal PEC labs --Reactive tracing --Discharge home in stable condition with PEC precautions  F/U: --[facility] MyChart visit 06/28/2019 --Repeat Cesarean with BTL 06/30/2019  Darlina Rumpf, CNM 06/24/2019, 5:44 PM

## 2019-06-27 ENCOUNTER — Encounter (HOSPITAL_COMMUNITY): Payer: Self-pay

## 2019-06-27 NOTE — Patient Instructions (Signed)
April Davenport  06/27/2019   Your procedure is scheduled on:  06/30/2019  Arrive at 1030 at Entrance C on [address] at [facility]  and [employer]. You are invited to use the FREE valet parking or use the Visitor's parking deck.  Pick up the phone at the desk and dial [phone].  Call this number if you have problems the morning of surgery: [phone]  Remember:   Do not eat food:(After Midnight) Desps de medianoche.  Do not drink clear liquids: (After Midnight) Desps de medianoche.  Take these medicines the morning of surgery with A SIP OF WATER:  Take labetalol as prescribed.  No other medications on the day of surgery.   Do not wear jewelry, make-up or nail polish.  Do not wear lotions, powders, or perfumes. Do not wear deodorant.  Do not shave 48 hours prior to surgery.  Do not bring valuables to the hospital.  [facility] is not   responsible for any belongings or valuables brought to the hospital.  Contacts, dentures or bridgework may not be worn into surgery.  Leave suitcase in the car. After surgery it may be brought to your room.  For patients admitted to the hospital, checkout time is 11:00 AM the day of              discharge.      Please read over the following fact sheets that you were given:     Preparing for Surgery

## 2019-06-28 ENCOUNTER — Telehealth (INDEPENDENT_AMBULATORY_CARE_PROVIDER_SITE_OTHER): Payer: Federal, State, Local not specified - PPO | Admitting: Obstetrics and Gynecology

## 2019-06-28 ENCOUNTER — Other Ambulatory Visit: Payer: Self-pay

## 2019-06-28 ENCOUNTER — Other Ambulatory Visit (HOSPITAL_COMMUNITY)
Admission: RE | Admit: 2019-06-28 | Discharge: 2019-06-28 | Disposition: A | Discharge status: Home / Self Care | Payer: Federal, State, Local not specified - PPO | Source: Ambulatory Visit | Attending: Obstetrics & Gynecology | Admitting: Obstetrics & Gynecology

## 2019-06-28 DIAGNOSIS — Z20822 Contact with and (suspected) exposure to covid-19: Secondary | ICD-10-CM | POA: Insufficient documentation

## 2019-06-28 DIAGNOSIS — Z3A37 37 weeks gestation of pregnancy: Secondary | ICD-10-CM

## 2019-06-28 DIAGNOSIS — O10913 Unspecified pre-existing hypertension complicating pregnancy, third trimester: Secondary | ICD-10-CM

## 2019-06-28 DIAGNOSIS — O99213 Obesity complicating pregnancy, third trimester: Secondary | ICD-10-CM

## 2019-06-28 DIAGNOSIS — Z8759 Personal history of other complications of pregnancy, childbirth and the puerperium: Secondary | ICD-10-CM

## 2019-06-28 DIAGNOSIS — Z3009 Encounter for other general counseling and advice on contraception: Secondary | ICD-10-CM

## 2019-06-28 DIAGNOSIS — Z98891 History of uterine scar from previous surgery: Secondary | ICD-10-CM

## 2019-06-28 DIAGNOSIS — O10919 Unspecified pre-existing hypertension complicating pregnancy, unspecified trimester: Secondary | ICD-10-CM

## 2019-06-28 DIAGNOSIS — O0993 Supervision of high risk pregnancy, unspecified, third trimester: Secondary | ICD-10-CM

## 2019-06-28 DIAGNOSIS — Z01812 Encounter for preprocedural laboratory examination: Secondary | ICD-10-CM | POA: Diagnosis not present

## 2019-06-28 DIAGNOSIS — R7989 Other specified abnormal findings of blood chemistry: Secondary | ICD-10-CM

## 2019-06-28 LAB — CBC
HCT: 35.9 % — ABNORMAL LOW (ref 36.0–46.0)
Hemoglobin: 11.7 g/dL — ABNORMAL LOW (ref 12.0–15.0)
MCH: 27.7 pg (ref 26.0–34.0)
MCHC: 32.6 g/dL (ref 30.0–36.0)
MCV: 85.1 fL (ref 80.0–100.0)
Platelets: 268 10*3/uL (ref 150–400)
RBC: 4.22 MIL/uL (ref 3.87–5.11)
RDW: 13.6 % (ref 11.5–15.5)
WBC: 7.8 10*3/uL (ref 4.0–10.5)
nRBC: 0 % (ref 0.0–0.2)

## 2019-06-28 LAB — TYPE AND SCREEN
ABO/RH(D): A POS
Antibody Screen: NEGATIVE

## 2019-06-28 LAB — SARS CORONAVIRUS 2 (TAT 6-24 HRS): SARS Coronavirus 2: NEGATIVE

## 2019-06-28 LAB — ABO/RH: ABO/RH(D): A POS

## 2019-06-28 NOTE — Progress Notes (Signed)
    TELEHEALTH VIRTUAL OBSTETRICS VISIT ENCOUNTER NOTE  Clinic: [facility]  I connected with April Davenport on 06/27/19 at  4:00 PM EST by telephone at home and verified that I am speaking with the correct person using two identifiers.   I discussed the limitations, risks, security and privacy concerns of performing an evaluation and management service by telephone and the availability of in person appointments. I also discussed with the patient that there may be a patient responsible charge related to this service. The patient expressed understanding and agreed to proceed.  Subjective:  April Davenport is a 29 y.o. [id number] at [redacted]w[redacted]d being followed for ongoing prenatal care.  She is currently monitored for the following issues for this high-risk pregnancy and has Essential hypertension; History of kidney stones; Obesity, Class III, BMI 40-49.9 (morbid obesity) (HCC); Preexisting hypertension complicating pregnancy, antepartum; Supervision of high-risk pregnancy; History of low transverse cesarean section x 1; History of severe pre-eclampsia; Maternal morbid obesity, antepartum (HCC); Previous preterm delivery, antepartum; Low TSH level; Thickening of nuchal fold of fetus; and Unwanted fertility on their problem list.  Patient reports no complaints. Reports fetal movement. Denies any contractions, bleeding or leaking of fluid.   The following portions of the patient's history were reviewed and updated as appropriate: allergies, current medications, past family history, past medical history, past social history, past surgical history and problem list.   Objective:  There were no vitals filed for this visit.  Babyscripts Data Reviewed: yes  General:  Alert, oriented and cooperative.   Mental Status: Normal mood and affect perceived. Normal judgment and thought content.  Rest of physical exam deferred due to type of encounter  Assessment and Plan:  Pregnancy: [id number] at  [redacted]w[redacted]d 1. Maternal morbid obesity, antepartum (HCC)  2. History of low transverse cesarean section x 1 Scheduled for thursday  3. History of severe pre-eclampsia No issues. Continue low dose asa  4. Low TSH level Normal TSH July 2020  5. Preexisting hypertension complicating pregnancy, antepartum Continue laberalol 100 bid. Normal BPs in MAU Normal bpp a few days ago  6. Supervision of high risk pregnancy in third trimester  7. Unwanted fertility Leaning towards BTL  Preterm labor symptoms and general obstetric precautions including but not limited to vaginal bleeding, contractions, leaking of fluid and fetal movement were reviewed in detail with the patient.  I discussed the assessment and treatment plan with the patient. The patient was provided an opportunity to ask questions and all were answered. The patient agreed with the plan and demonstrated an understanding of the instructions. The patient was advised to call back or seek an in-person office evaluation/go to MAU at [facility] for any urgent or concerning symptoms. Please refer to After Visit Summary for other counseling recommendations.   I provided 10 minutes of non-face-to-face time during this encounter. The visit was conducted via MyChart-medicine  No follow-ups on file.  Future Appointments  Date Time Provider Department Center  06/28/2019  4:00 PM [place] Bing, MD [facility]  07/14/2019 10:30 AM [facility] NURSE [facility]  07/28/2019 10:30 AM Anyanwu, Jethro Bastos, MD [facility]    [place] Bing, MD [facility]

## 2019-06-28 NOTE — MAU Note (Signed)
Covid swab collected. Pt asymptomatic 

## 2019-06-29 LAB — RPR: RPR Ser Ql: NONREACTIVE

## 2019-06-30 ENCOUNTER — Inpatient Hospital Stay (HOSPITAL_COMMUNITY)
Admission: RE | Admit: 2019-06-30 | Discharge: 2019-07-02 | DRG: 784 | Disposition: A | Discharge status: Home / Self Care | Payer: Federal, State, Local not specified - PPO | Attending: Obstetrics & Gynecology | Admitting: Obstetrics & Gynecology

## 2019-06-30 ENCOUNTER — Encounter (HOSPITAL_COMMUNITY)
Admission: RE | Disposition: A | Discharge status: Home / Self Care | Payer: Self-pay | Source: Home / Self Care | Attending: Obstetrics & Gynecology

## 2019-06-30 ENCOUNTER — Inpatient Hospital Stay (HOSPITAL_COMMUNITY): Payer: Federal, State, Local not specified - PPO | Admitting: Anesthesiology

## 2019-06-30 ENCOUNTER — Other Ambulatory Visit: Payer: Self-pay

## 2019-06-30 ENCOUNTER — Encounter (HOSPITAL_COMMUNITY): Payer: Self-pay | Admitting: Obstetrics & Gynecology

## 2019-06-30 DIAGNOSIS — Z98891 History of uterine scar from previous surgery: Secondary | ICD-10-CM

## 2019-06-30 DIAGNOSIS — Z3009 Encounter for other general counseling and advice on contraception: Secondary | ICD-10-CM | POA: Diagnosis present

## 2019-06-30 DIAGNOSIS — O10919 Unspecified pre-existing hypertension complicating pregnancy, unspecified trimester: Secondary | ICD-10-CM | POA: Diagnosis present

## 2019-06-30 DIAGNOSIS — Z3A38 38 weeks gestation of pregnancy: Secondary | ICD-10-CM | POA: Diagnosis not present

## 2019-06-30 DIAGNOSIS — Z20822 Contact with and (suspected) exposure to covid-19: Secondary | ICD-10-CM | POA: Diagnosis present

## 2019-06-30 DIAGNOSIS — I10 Essential (primary) hypertension: Secondary | ICD-10-CM | POA: Diagnosis present

## 2019-06-30 DIAGNOSIS — O164 Unspecified maternal hypertension, complicating childbirth: Secondary | ICD-10-CM | POA: Diagnosis not present

## 2019-06-30 DIAGNOSIS — O34211 Maternal care for low transverse scar from previous cesarean delivery: Secondary | ICD-10-CM | POA: Diagnosis not present

## 2019-06-30 DIAGNOSIS — O99214 Obesity complicating childbirth: Secondary | ICD-10-CM | POA: Diagnosis present

## 2019-06-30 DIAGNOSIS — Z302 Encounter for sterilization: Secondary | ICD-10-CM

## 2019-06-30 DIAGNOSIS — O1002 Pre-existing essential hypertension complicating childbirth: Secondary | ICD-10-CM | POA: Diagnosis present

## 2019-06-30 SURGERY — Surgical Case
Anesthesia: Spinal | Laterality: Bilateral

## 2019-06-30 MED ORDER — MEPERIDINE HCL 25 MG/ML IJ SOLN: 6.2500 mg | INTRAMUSCULAR | Status: DC | PRN

## 2019-06-30 MED ORDER — OXYTOCIN 40 UNITS IN NORMAL SALINE INFUSION - SIMPLE MED
INTRAVENOUS | Status: AC
  Filled 2019-06-30: qty 1000

## 2019-06-30 MED ORDER — GABAPENTIN 300 MG PO CAPS
300.0000 mg | ORAL_CAPSULE | Freq: Two times a day (BID) | ORAL | Status: DC
  Administered 2019-06-30 – 2019-07-02 (×4): 300 mg via ORAL
  Filled 2019-06-30 (×4): qty 1

## 2019-06-30 MED ORDER — SODIUM CHLORIDE 0.9 % IV SOLN: INTRAVENOUS | Status: DC | PRN

## 2019-06-30 MED ORDER — OXYTOCIN 40 UNITS IN NORMAL SALINE INFUSION - SIMPLE MED: 2.5000 [IU]/h | INTRAVENOUS | Status: AC

## 2019-06-30 MED ORDER — MORPHINE SULFATE (PF) 0.5 MG/ML IJ SOLN
INTRAMUSCULAR | Status: AC
  Filled 2019-06-30: qty 10

## 2019-06-30 MED ORDER — ACETAMINOPHEN 500 MG PO TABS
1000.0000 mg | ORAL_TABLET | ORAL | Status: AC
  Administered 2019-06-30: 1000 mg via ORAL

## 2019-06-30 MED ORDER — TETANUS-DIPHTH-ACELL PERTUSSIS 5-2.5-18.5 LF-MCG/0.5 IM SUSP: 0.5000 mL | Freq: Once | INTRAMUSCULAR | Status: DC

## 2019-06-30 MED ORDER — OXYCODONE HCL 5 MG/5ML PO SOLN: 5.0000 mg | Freq: Once | ORAL | Status: DC | PRN

## 2019-06-30 MED ORDER — OXYCODONE-ACETAMINOPHEN 5-325 MG PO TABS: 2.0000 | ORAL_TABLET | ORAL | Status: DC | PRN

## 2019-06-30 MED ORDER — SODIUM CHLORIDE 0.9 % IV SOLN
INTRAVENOUS | Status: AC
  Filled 2019-06-30: qty 2

## 2019-06-30 MED ORDER — OXYCODONE HCL 5 MG PO TABS: 5.0000 mg | ORAL_TABLET | Freq: Once | ORAL | Status: DC | PRN

## 2019-06-30 MED ORDER — GABAPENTIN 300 MG PO CAPS
ORAL_CAPSULE | ORAL | Status: AC
  Filled 2019-06-30: qty 1

## 2019-06-30 MED ORDER — SIMETHICONE 80 MG PO CHEW: 80.0000 mg | CHEWABLE_TABLET | ORAL | Status: DC | PRN

## 2019-06-30 MED ORDER — ONDANSETRON HCL 4 MG/2ML IJ SOLN: 4.0000 mg | Freq: Three times a day (TID) | INTRAMUSCULAR | Status: DC | PRN

## 2019-06-30 MED ORDER — DEXAMETHASONE SODIUM PHOSPHATE 10 MG/ML IJ SOLN
INTRAMUSCULAR | Status: DC | PRN
  Administered 2019-06-30: 10 mg via INTRAVENOUS

## 2019-06-30 MED ORDER — DIPHENHYDRAMINE HCL 25 MG PO CAPS: 25.0000 mg | ORAL_CAPSULE | ORAL | Status: DC | PRN

## 2019-06-30 MED ORDER — DIBUCAINE (PERIANAL) 1 % EX OINT: 1.0000 "application " | TOPICAL_OINTMENT | CUTANEOUS | Status: DC | PRN

## 2019-06-30 MED ORDER — LACTATED RINGERS IV SOLN: INTRAVENOUS | Status: DC

## 2019-06-30 MED ORDER — PROMETHAZINE HCL 25 MG/ML IJ SOLN: 6.2500 mg | INTRAMUSCULAR | Status: DC | PRN

## 2019-06-30 MED ORDER — OXYTOCIN 40 UNITS IN NORMAL SALINE INFUSION - SIMPLE MED
INTRAVENOUS | Status: DC | PRN
  Administered 2019-06-30: 40 mL via INTRAVENOUS

## 2019-06-30 MED ORDER — SODIUM CHLORIDE 0.9 % IR SOLN
Status: DC | PRN
  Administered 2019-06-30: 1

## 2019-06-30 MED ORDER — BUPIVACAINE IN DEXTROSE 0.75-8.25 % IT SOLN
INTRATHECAL | Status: DC | PRN
  Administered 2019-06-30: 1.6 mL via INTRATHECAL

## 2019-06-30 MED ORDER — ONDANSETRON HCL 4 MG/2ML IJ SOLN
INTRAMUSCULAR | Status: DC | PRN
  Administered 2019-06-30: 4 mg via INTRAVENOUS

## 2019-06-30 MED ORDER — NALBUPHINE HCL 10 MG/ML IJ SOLN
5.0000 mg | Freq: Once | INTRAMUSCULAR | Status: AC | PRN
  Administered 2019-06-30: 5 mg via INTRAVENOUS

## 2019-06-30 MED ORDER — MENTHOL 3 MG MT LOZG: 1.0000 | LOZENGE | OROMUCOSAL | Status: DC | PRN

## 2019-06-30 MED ORDER — NALBUPHINE HCL 10 MG/ML IJ SOLN: 5.0000 mg | Freq: Once | INTRAMUSCULAR | Status: AC | PRN

## 2019-06-30 MED ORDER — DEXAMETHASONE SODIUM PHOSPHATE 10 MG/ML IJ SOLN
INTRAMUSCULAR | Status: AC
  Filled 2019-06-30: qty 1

## 2019-06-30 MED ORDER — NALOXONE HCL 4 MG/10ML IJ SOLN
1.0000 ug/kg/h | INTRAVENOUS | Status: DC | PRN
  Filled 2019-06-30: qty 5

## 2019-06-30 MED ORDER — DIPHENHYDRAMINE HCL 25 MG PO CAPS: 25.0000 mg | ORAL_CAPSULE | Freq: Four times a day (QID) | ORAL | Status: DC | PRN

## 2019-06-30 MED ORDER — NALBUPHINE HCL 10 MG/ML IJ SOLN
5.0000 mg | INTRAMUSCULAR | Status: DC | PRN
  Administered 2019-07-01: 5 mg via INTRAVENOUS
  Filled 2019-06-30 (×2): qty 1

## 2019-06-30 MED ORDER — KETOROLAC TROMETHAMINE 30 MG/ML IJ SOLN
30.0000 mg | Freq: Four times a day (QID) | INTRAMUSCULAR | Status: AC
  Administered 2019-06-30 – 2019-07-01 (×4): 30 mg via INTRAVENOUS
  Filled 2019-06-30 (×3): qty 1

## 2019-06-30 MED ORDER — FENTANYL CITRATE (PF) 100 MCG/2ML IJ SOLN
INTRAMUSCULAR | Status: DC | PRN
  Administered 2019-06-30: 15 ug via INTRATHECAL

## 2019-06-30 MED ORDER — KETOROLAC TROMETHAMINE 30 MG/ML IJ SOLN
30.0000 mg | Freq: Once | INTRAMUSCULAR | Status: AC | PRN
  Administered 2019-06-30: 30 mg via INTRAVENOUS

## 2019-06-30 MED ORDER — TRAMADOL HCL 50 MG PO TABS: 50.0000 mg | ORAL_TABLET | Freq: Four times a day (QID) | ORAL | Status: DC | PRN

## 2019-06-30 MED ORDER — ONDANSETRON HCL 4 MG/2ML IJ SOLN
INTRAMUSCULAR | Status: AC
  Filled 2019-06-30: qty 2

## 2019-06-30 MED ORDER — SCOPOLAMINE 1 MG/3DAYS TD PT72
MEDICATED_PATCH | TRANSDERMAL | Status: AC
  Filled 2019-06-30: qty 1

## 2019-06-30 MED ORDER — NALOXONE HCL 0.4 MG/ML IJ SOLN: 0.4000 mg | INTRAMUSCULAR | Status: DC | PRN

## 2019-06-30 MED ORDER — PRENATAL MULTIVITAMIN CH
1.0000 | ORAL_TABLET | Freq: Every day | ORAL | Status: DC
  Administered 2019-07-01 – 2019-07-02 (×2): 1 via ORAL
  Filled 2019-06-30 (×3): qty 1

## 2019-06-30 MED ORDER — GABAPENTIN 300 MG PO CAPS
300.0000 mg | ORAL_CAPSULE | ORAL | Status: AC
  Administered 2019-06-30: 300 mg via ORAL

## 2019-06-30 MED ORDER — SODIUM CHLORIDE 0.9% FLUSH: 3.0000 mL | INTRAVENOUS | Status: DC | PRN

## 2019-06-30 MED ORDER — ZOLPIDEM TARTRATE 5 MG PO TABS: 5.0000 mg | ORAL_TABLET | Freq: Every evening | ORAL | Status: DC | PRN

## 2019-06-30 MED ORDER — MAGNESIUM HYDROXIDE 400 MG/5ML PO SUSP: 30.0000 mL | ORAL | Status: DC | PRN

## 2019-06-30 MED ORDER — NALBUPHINE HCL 10 MG/ML IJ SOLN: 5.0000 mg | INTRAMUSCULAR | Status: DC | PRN

## 2019-06-30 MED ORDER — WITCH HAZEL-GLYCERIN EX PADS: 1.0000 "application " | MEDICATED_PAD | CUTANEOUS | Status: DC | PRN

## 2019-06-30 MED ORDER — OXYCODONE HCL 5 MG PO TABS: 5.0000 mg | ORAL_TABLET | ORAL | Status: DC | PRN

## 2019-06-30 MED ORDER — SCOPOLAMINE 1 MG/3DAYS TD PT72
1.0000 | MEDICATED_PATCH | Freq: Once | TRANSDERMAL | Status: DC
  Administered 2019-06-30: 1.5 mg via TRANSDERMAL

## 2019-06-30 MED ORDER — ACETAMINOPHEN 500 MG PO TABS
ORAL_TABLET | ORAL | Status: AC
  Filled 2019-06-30: qty 2

## 2019-06-30 MED ORDER — DIPHENHYDRAMINE HCL 50 MG/ML IJ SOLN: 12.5000 mg | INTRAMUSCULAR | Status: DC | PRN

## 2019-06-30 MED ORDER — SENNOSIDES-DOCUSATE SODIUM 8.6-50 MG PO TABS
2.0000 | ORAL_TABLET | ORAL | Status: DC
  Administered 2019-06-30 – 2019-07-01 (×2): 2 via ORAL
  Filled 2019-06-30 (×2): qty 2

## 2019-06-30 MED ORDER — FENTANYL CITRATE (PF) 100 MCG/2ML IJ SOLN
INTRAMUSCULAR | Status: AC
  Filled 2019-06-30: qty 2

## 2019-06-30 MED ORDER — PHENYLEPHRINE HCL-NACL 20-0.9 MG/250ML-% IV SOLN
INTRAVENOUS | Status: AC
  Filled 2019-06-30: qty 250

## 2019-06-30 MED ORDER — SOD CITRATE-CITRIC ACID 500-334 MG/5ML PO SOLN
ORAL | Status: AC
  Filled 2019-06-30: qty 30

## 2019-06-30 MED ORDER — SOD CITRATE-CITRIC ACID 500-334 MG/5ML PO SOLN
30.0000 mL | ORAL | Status: AC
  Administered 2019-06-30: 30 mL via ORAL

## 2019-06-30 MED ORDER — AMLODIPINE BESYLATE 5 MG PO TABS
10.0000 mg | ORAL_TABLET | Freq: Every day | ORAL | Status: DC
  Administered 2019-06-30 – 2019-07-02 (×3): 10 mg via ORAL
  Filled 2019-06-30 (×3): qty 2

## 2019-06-30 MED ORDER — COCONUT OIL OIL: 1.0000 "application " | TOPICAL_OIL | Status: DC | PRN

## 2019-06-30 MED ORDER — SODIUM CHLORIDE 0.9 % IV SOLN
2.0000 g | INTRAVENOUS | Status: AC
  Administered 2019-06-30: 2 g via INTRAVENOUS

## 2019-06-30 MED ORDER — MORPHINE SULFATE (PF) 0.5 MG/ML IJ SOLN
INTRAMUSCULAR | Status: DC | PRN
  Administered 2019-06-30: 150 ug via INTRATHECAL

## 2019-06-30 MED ORDER — SIMETHICONE 80 MG PO CHEW
80.0000 mg | CHEWABLE_TABLET | ORAL | Status: DC
  Administered 2019-06-30 – 2019-07-01 (×2): 80 mg via ORAL
  Filled 2019-06-30 (×2): qty 1

## 2019-06-30 MED ORDER — PHENYLEPHRINE HCL-NACL 20-0.9 MG/250ML-% IV SOLN
INTRAVENOUS | Status: DC | PRN
  Administered 2019-06-30: 60 ug/min via INTRAVENOUS

## 2019-06-30 MED ORDER — ENOXAPARIN SODIUM 60 MG/0.6ML ~~LOC~~ SOLN
0.5000 mg/kg | SUBCUTANEOUS | Status: DC
  Administered 2019-07-01 – 2019-07-02 (×2): 55 mg via SUBCUTANEOUS
  Filled 2019-06-30 (×2): qty 0.6

## 2019-06-30 MED ORDER — KETOROLAC TROMETHAMINE 30 MG/ML IJ SOLN
INTRAMUSCULAR | Status: AC
  Filled 2019-06-30: qty 1

## 2019-06-30 MED ORDER — IBUPROFEN 800 MG PO TABS
800.0000 mg | ORAL_TABLET | Freq: Four times a day (QID) | ORAL | Status: DC
  Administered 2019-07-01 – 2019-07-02 (×4): 800 mg via ORAL
  Filled 2019-06-30 (×4): qty 1

## 2019-06-30 MED ORDER — FERROUS SULFATE 325 (65 FE) MG PO TABS
325.0000 mg | ORAL_TABLET | Freq: Two times a day (BID) | ORAL | Status: DC
  Administered 2019-07-01 – 2019-07-02 (×3): 325 mg via ORAL
  Filled 2019-06-30 (×3): qty 1

## 2019-06-30 MED ORDER — MEASLES, MUMPS & RUBELLA VAC IJ SOLR: 0.5000 mL | Freq: Once | INTRAMUSCULAR | Status: DC

## 2019-06-30 SURGICAL SUPPLY — 36 items
BENZOIN TINCTURE PRP APPL 2/3 (GAUZE/BANDAGES/DRESSINGS) ×2 IMPLANT
CHLORAPREP W/TINT 26ML (MISCELLANEOUS) ×2 IMPLANT
CLAMP CORD UMBIL (MISCELLANEOUS) IMPLANT
CLOTH BEACON ORANGE TIMEOUT ST (SAFETY) ×2 IMPLANT
CLSR STERI-STRIP ANTIMIC 1/2X4 (GAUZE/BANDAGES/DRESSINGS) ×2 IMPLANT
DRSG OPSITE POSTOP 4X10 (GAUZE/BANDAGES/DRESSINGS) ×2 IMPLANT
ELECT REM PT RETURN 9FT ADLT (ELECTROSURGICAL) ×2
ELECTRODE REM PT RTRN 9FT ADLT (ELECTROSURGICAL) ×1 IMPLANT
EXTRACTOR VACUUM M CUP 4 TUBE (SUCTIONS) IMPLANT
GAUZE SPONGE 4X4 12PLY STRL LF (GAUZE/BANDAGES/DRESSINGS) ×2 IMPLANT
GLOVE BIOGEL PI IND STRL 7.0 (GLOVE) ×3 IMPLANT
GLOVE BIOGEL PI INDICATOR 7.0 (GLOVE) ×3
GLOVE ECLIPSE 7.0 STRL STRAW (GLOVE) ×2 IMPLANT
GOWN STRL REUS W/TWL LRG LVL3 (GOWN DISPOSABLE) ×4 IMPLANT
HOVERMATT SINGLE USE (MISCELLANEOUS) ×2 IMPLANT
KIT ABG SYR 3ML LUER SLIP (SYRINGE) IMPLANT
NEEDLE HYPO 22GX1.5 SAFETY (NEEDLE) ×2 IMPLANT
NEEDLE HYPO 25X5/8 SAFETYGLIDE (NEEDLE) ×2 IMPLANT
NS IRRIG 1000ML POUR BTL (IV SOLUTION) ×2 IMPLANT
PACK C SECTION WH (CUSTOM PROCEDURE TRAY) ×2 IMPLANT
PAD ABD 7.5X8 STRL (GAUZE/BANDAGES/DRESSINGS) ×2 IMPLANT
PAD ABD 8X10 STRL (GAUZE/BANDAGES/DRESSINGS) ×2 IMPLANT
PAD OB MATERNITY 4.3X12.25 (PERSONAL CARE ITEMS) ×2 IMPLANT
PENCIL SMOKE EVAC W/HOLSTER (ELECTROSURGICAL) ×2 IMPLANT
RTRCTR C-SECT PINK 25CM LRG (MISCELLANEOUS) IMPLANT
SUT PDS AB 0 CTX 36 PDP370T (SUTURE) IMPLANT
SUT PLAIN 2 0 XLH (SUTURE) IMPLANT
SUT VIC AB 0 CTX 36 (SUTURE) ×2
SUT VIC AB 0 CTX36XBRD ANBCTRL (SUTURE) ×2 IMPLANT
SUT VIC AB 2-0 CT1 27 (SUTURE) ×2
SUT VIC AB 2-0 CT1 TAPERPNT 27 (SUTURE) ×2 IMPLANT
SUT VIC AB 4-0 KS 27 (SUTURE) ×2 IMPLANT
SYR CONTROL 10ML LL (SYRINGE) ×2 IMPLANT
TOWEL OR 17X24 6PK STRL BLUE (TOWEL DISPOSABLE) ×2 IMPLANT
TRAY FOLEY W/BAG SLVR 14FR LF (SET/KITS/TRAYS/PACK) ×2 IMPLANT
WATER STERILE IRR 1000ML POUR (IV SOLUTION) ×2 IMPLANT

## 2019-06-30 NOTE — Progress Notes (Signed)
Pt set up with DEBP, infant currently in NICU.  Setup, cleaning, reviewed in detail.  Encouraged to pump q 3hrs.

## 2019-06-30 NOTE — Transfer of Care (Signed)
Immediate Anesthesia Transfer of Care Note  Patient: April Davenport  Procedure(s) Performed: CESAREAN SECTION WITH BILATERAL TUBAL LIGATION (Bilateral )  Patient Location: PACU  Anesthesia Type:Spinal  Level of Consciousness: awake  Airway & Oxygen Therapy: Patient Spontanous Breathing  Post-op Assessment: Report given to RN  Post vital signs: Reviewed and stable  Last Vitals:  Vitals Value Taken Time  BP 136/95 06/30/19 1330  Temp    Pulse 82 06/30/19 1331  Resp 13 06/30/19 1331  SpO2 100 % 06/30/19 1331  Vitals shown include unvalidated device data.  Last Pain:  Vitals:   06/30/19 1046  TempSrc: Oral         Complications: No apparent anesthesia complications

## 2019-06-30 NOTE — Anesthesia Preprocedure Evaluation (Signed)
Anesthesia Evaluation  Patient identified by MRN, date of birth, ID band Patient awake    Reviewed: Allergy & Precautions, NPO status , Patient's Chart, lab work & pertinent test results  Airway Mallampati: II  TM Distance: >3 FB Neck ROM: Full    Dental no notable dental hx.    Pulmonary neg pulmonary ROS,    Pulmonary exam normal breath sounds clear to auscultation       Cardiovascular hypertension, negative cardio ROS Normal cardiovascular exam Rhythm:Regular Rate:Normal     Neuro/Psych negative neurological ROS  negative psych ROS   GI/Hepatic negative GI ROS, Neg liver ROS,   Endo/Other  Morbid obesity  Renal/GU negative Renal ROS  negative genitourinary   Musculoskeletal negative musculoskeletal ROS (+)   Abdominal (+) + obese,   Peds negative pediatric ROS (+)  Hematology negative hematology ROS (+)   Anesthesia Other Findings   Reproductive/Obstetrics (+) Pregnancy                             Anesthesia Physical Anesthesia Plan  ASA: III  Anesthesia Plan: Spinal   Post-op Pain Management:    Induction:   PONV Risk Score and Plan: 2 and Ondansetron, Midazolam and Treatment may vary due to age or medical condition  Airway Management Planned: Natural Airway  Additional Equipment:   Intra-op Plan:   Post-operative Plan:   Informed Consent: I have reviewed the patients History and Physical, chart, labs and discussed the procedure including the risks, benefits and alternatives for the proposed anesthesia with the patient or authorized representative who has indicated his/her understanding and acceptance.     Dental advisory given  Plan Discussed with: CRNA  Anesthesia Plan Comments:         Anesthesia Quick Evaluation

## 2019-06-30 NOTE — Lactation Note (Signed)
This note was copied from a baby's chart. Lactation Consultation Note  Patient Name: [name] [id number] Date: 06/30/2019   Mom delivered baby boy Manson Passey via csection.  He is now 7 hours old and transitioned to NICU past delivery.  Mom with chronic hypertension and Obesity.  Mom has already inititiated pumping with [employer] [facility].  Mom reports pumped three times in a row because it kept turning off.  Reviewed the different DEBP setting with mom.  Urged not to pump more than 30 minutes per session right now.  Mom reports pumping comfortable. Mom reports she is on [facility] in [place].  Plans to get DEBP from [facility].  [facility] Referral sent.  And mom plans to also call them tomorrow. Mom is an experienced breastfeeding mom.  Mom reports she breastfed her first child for 8 months without challenges. Did not Demo hand expression.  Mom reports no one has demonstrated hand expression to her with this baby but she thinks she knows how from last time.  Discussed adding massage and hand expression to pumping.  Reviewed how to clean pump parts and let them air dry. Mom has small amount of colostrum in bottom of bottles.  Assisted with collecting it and mom plans to take it to NICU when she goes.  Reviewed and left NICU booklet. Urged mom to call lactation as needed.  Urged her to pump 8-12 times day for 15 minutes.    Maternal Data    Feeding Feeding Type: Formula Nipple Type: Slow - flow  LATCH Score                   Interventions    Lactation Tools Discussed/Used     Consult Status      Johnnathan Hagemeister Michaelle Copas 06/30/2019, 8:26 PM

## 2019-06-30 NOTE — H&P (Addendum)
Obstetric Preoperative History and Physical  April Davenport is a 29 y.o. [id number] with IUP at [redacted]w[redacted]d presenting for scheduled cesarean section and bilateral tubal sterilization.  Reports good fetal movement, no bleeding, no contractions, no leaking of fluid.  No acute preoperative concerns.    Cesarean Section Indication: elective repeat (1 prior and declines TOLAC; desires infertility)  Prenatal Course Source of Care: [place] with onset of care at 9 weeks Pregnancy complications or risks: Patient Active Problem List   Diagnosis Date Noted  . Unwanted fertility 06/21/2019  . Thickening of nuchal fold of fetus 02/18/2019  . Low TSH level 12/13/2018  . Preexisting hypertension complicating pregnancy, antepartum 12/09/2018  . Supervision of high-risk pregnancy 12/09/2018  . History of low transverse cesarean section x 1 12/09/2018  . History of severe pre-eclampsia 12/09/2018  . Maternal morbid obesity, antepartum (HCC) 12/09/2018  . Previous preterm delivery, antepartum 12/09/2018  . Essential hypertension 03/18/2018  . History of kidney stones 03/18/2018  . Obesity, Class III, BMI 40-49.9 (morbid obesity) (HCC) 03/18/2018   She plans to breastfeed She desires bilateral tubal ligation for postpartum contraception.   Prenatal labs and studies: ABO, Rh: --/--/A POS, A POS Performed at [facility], [address], [place], [place] [address]  [phone]) Antibody: NEG (01/19 0919) Rubella: 4.91 (07/02 0934) RPR: NON REACTIVE (01/19 0919)  HBsAg: Negative (07/02 0934)  HIV: Non Reactive (11/17 0817)  JSH:FWYOVZCH/-- (01/12 1336) 2 hr Glucola  WNL Genetic screening NIPS low risk Anatomy US increased nuchal fold; declined amnio  Prenatal Transfer Tool  Maternal Diabetes: No Genetic Screening: Normal Maternal Ultrasounds/Referrals: Other: increased nuchal fold Fetal Ultrasounds or other Referrals:  None Maternal Substance Abuse:  No Significant Maternal Medications:   None Significant Maternal Lab Results: Group B Strep negative  Past Medical History:  Diagnosis Date  . Hypertension   . Right nephrolithiasis     Past Surgical History:  Procedure Laterality Date  . CESAREAN SECTION  05/16/2013  . KNEE SURGERY Right     OB History  Gravida Para Term Preterm AB Living  3 1   1 1 1   SAB TAB Ectopic Multiple Live Births  1       1    # Outcome Date GA Lbr Len/2nd Weight Sex Delivery Anes PTL Lv  3 Current           2 Preterm 05/16/13 [redacted]w[redacted]d  2268 g F CS-LTranv Spinal N LIV     Complications: Severe preeclampsia  1 SAB             Social History   Socioeconomic History  . Marital status: Single    Spouse name: Not on file  . Number of children: Not on file  . Years of education: Not on file  . Highest education level: Not on file  Occupational History  . Not on file  Tobacco Use  . Smoking status: Never Smoker  . Smokeless tobacco: Never Used  Substance and Sexual Activity  . Alcohol use: Not Currently    Comment: rare  . Drug use: Never  . Sexual activity: Yes    Birth control/protection: None  Other Topics Concern  . Not on file  Social History Narrative  . Not on file   Social Determinants of Health   Financial Resource Strain:   . Difficulty of Paying Living Expenses: Not on file  Food Insecurity:   . Worried About 2269 in the Last Year: Not on file  .  Ran Out of Food in the Last Year: Not on file  Transportation Needs:   . Lack of Transportation (Medical): Not on file  . Lack of Transportation (Non-Medical): Not on file  Physical Activity:   . Days of Exercise per Week: Not on file  . Minutes of Exercise per Session: Not on file  Stress:   . Feeling of Stress : Not on file  Social Connections:   . Frequency of Communication with Friends and Family: Not on file  . Frequency of Social Gatherings with Friends and Family: Not on file  . Attends Religious Services: Not on file  . Active Member of Clubs  or Organizations: Not on file  . Attends Banker Meetings: Not on file  . Marital Status: Not on file    Family History  Problem Relation Age of Onset  . Diabetes Mother   . Heart attack Father   . Hypertension Father   . Diabetes Paternal Grandfather     Medications Prior to Admission  Medication Sig Dispense Refill Last Dose  . aspirin EC 81 MG tablet Take 1 tablet (81 mg total) by mouth daily. Take after 12 weeks for prevention of preeclampsia later in pregnancy 300 tablet 2 Past Week at Unknown time  . labetalol (NORMODYNE) 100 MG tablet Take 1 tablet (100 mg total) by mouth 2 (two) times daily. 60 tablet 2 06/30/2019 at Unknown time  . acetaminophen (TYLENOL) 325 MG tablet Take 650 mg by mouth every 6 (six) hours as needed.     . Blood Pressure Monitoring (BLOOD PRESSURE CUFF) MISC 1 Device by Does not apply route once a week. To be monitored weekly 1 each 0   . Prenatal Vit-Fe Fumarate-FA (PRENATAL MULTIVITAMIN) TABS tablet Take 1 tablet by mouth daily at 12 noon.       Allergies  Allergen Reactions  . Dilaudid [Hydromorphone Hcl] Hives, Shortness Of Breath and Itching    Syncope  . Black [employer] and Swelling    Review of Systems: Pertinent items noted in HPI and remainder of comprehensive ROS otherwise negative.  Physical Exam: BP (!) 156/103   Temp 98.3 F (36.8 C) (Oral)   Resp 18   Ht 5\' 1"  (1.549 m)   Wt 113.4 kg   LMP 10/06/2018 (Approximate)   BMI 47.24 kg/m  FHR by Doppler: 136 bpm CONSTITUTIONAL: Well-developed, well-nourished female in no acute distress.  HENT:  Normocephalic, atraumatic, External right and left ear normal.  EYES: Conjunctivae and EOM are normal. No scleral icterus.  NECK: Normal range of motion, supple, no masses SKIN: Skin is warm and dry. No rash noted. Not diaphoretic. No erythema. No pallor. NEUROLGIC: Alert and oriented to person, place, and time. Normal reflexes, muscle tone coordination. No cranial nerve  deficit noted. PSYCHIATRIC: Normal mood and affect. Normal behavior. Normal judgment and thought content. CARDIOVASCULAR: Normal heart rate noted RESPIRATORY: Effort and breath sounds normal, no problems with respiration noted ABDOMEN: Soft, nontender, nondistended, gravid. Well-healed Pfannenstiel incision. PELVIC: Deferred MUSCULOSKELETAL: Normal range of motion. No edema and no tenderness. 2+ distal pulses.   Pertinent Labs/Studies:   Results for orders placed or performed during the hospital encounter of 06/28/19 (from the past 72 hour(s))  SARS CORONAVIRUS 2 (TAT 6-24 HRS) Nasopharyngeal Nasopharyngeal Swab     Status: None   Collection Time: 06/28/19  9:05 AM   Specimen: Nasopharyngeal Swab  Result Value Ref Range   SARS Coronavirus 2 NEGATIVE NEGATIVE    Comment: (NOTE) SARS-CoV-2  target nucleic acids are NOT DETECTED. The SARS-CoV-2 RNA is generally detectable in upper and lower respiratory specimens during the acute phase of infection. Negative results do not preclude SARS-CoV-2 infection, do not rule out co-infections with other pathogens, and should not be used as the sole basis for treatment or other patient management decisions. Negative results must be combined with clinical observations, patient history, and epidemiological information. The expected result is Negative. Fact Sheet for Patients: HairSlick.no Fact Sheet for Healthcare Providers: quierodirigir.com This test is not yet approved or cleared by the [place] FDA and  has been authorized for detection and/or diagnosis of SARS-CoV-2 by FDA under an Emergency Use Authorization (EUA). This EUA will remain  in effect (meaning this test can be used) for the duration of the COVID-19 declaration under Section 56 4(b)(1) of the Act, 21 U.S.C. section 360bbb-3(b)(1), unless the authorization is terminated or revoked sooner. Performed at [facility]  [facility], [address] [place], [place] [address]   Type and screen [facility]     Status: None   Collection Time: 06/28/19  9:19 AM  Result Value Ref Range   ABO/RH(D) A POS    Antibody Screen NEG    Sample Expiration      07/01/2019,2359 Performed at [facility], [address] [place], [place] [address]   CBC     Status: Abnormal   Collection Time: 06/28/19  9:19 AM  Result Value Ref Range   WBC 7.8 4.0 - 10.5 K/uL   RBC 4.22 3.87 - 5.11 MIL/uL   Hemoglobin 11.7 (L) 12.0 - 15.0 g/dL   HCT 75.1 (L) 02.5 - 85.2 %   MCV 85.1 80.0 - 100.0 fL   MCH 27.7 26.0 - 34.0 pg   MCHC 32.6 30.0 - 36.0 g/dL   RDW 77.8 24.2 - 35.3 %   Platelets 268 150 - 400 K/uL   nRBC 0.0 0.0 - 0.2 %    Comment: Performed at [facility], [address] [place], [place] [address]  RPR     Status: None   Collection Time: 06/28/19  9:19 AM  Result Value Ref Range   RPR Ser Ql NON REACTIVE NON REACTIVE    Comment: Performed at [facility], [address] [place], [place] [address]  ABO/Rh     Status: None   Collection Time: 06/28/19  9:19 AM  Result Value Ref Range   ABO/RH(D)      A POS Performed at [facility], [address], [place], [place] [address]     Assessment and Plan: Ocia Simek is a 29 y.o. [phone] at [redacted]w[redacted]d being admitted for scheduled cesarean section and bilateral tubal sterilization. The risks of cesarean section were discussed with the patient including but were not limited to: bleeding which may require transfusion or reoperation; infection which may require antibiotics; injury to bowel, bladder, ureters or other surrounding organs; injury to the fetus; need for additional procedures including hysterectomy in the event of a life-threatening hemorrhage; placental abnormalities wth subsequent pregnancies, incisional problems, thromboembolic phenomenon and other postoperative/anesthesia complications.  Patient also desires permanent sterilization.   Other reversible forms of contraception were discussed with patient; she declines all other modalities. Risks of procedure discussed with patient including but not limited to: risk of regret, permanence of method, bleeding, infection, injury to surrounding organs and need for additional procedures.  Failure risk of < 1% with increased risk of ectopic gestation if  pregnancy occurs was also discussed with patient.  The patient concurred with the proposed plan, giving informed written consent for the procedures.  Patient has been NPO since last night she will remain NPO for procedure. Anesthesia and OR aware.  Preoperative prophylactic antibiotics and SCDs ordered on call to the OR.  To OR when ready.  Pregnancy Complications: cHTN taking 100 mg Labetalol BID. Maternal obesity. Hx of C-section x1. Contraception: BTL Circumcision: Declines MOF: Breast  Barrington Ellison, MD OB Family Medicine Fellow, [facility] for [employer], [place] of Attending Supervision of Obstetric Fellow: Evaluation and management procedures were performed by the Obstetric Fellow under my supervision and collaboration.  I have reviewed the Obstetric Fellow's note and chart, and I agree with the management and plan.  Patient was seen and evaluated, reiterated the risks as above. All questions answered. To OR when ready.   Verita Schneiders, MD, [place] Attending [place], [facility] for [employer], [place]

## 2019-06-30 NOTE — Anesthesia Postprocedure Evaluation (Signed)
Anesthesia Post Note  Patient: April Davenport  Procedure(s) Performed: CESAREAN SECTION WITH BILATERAL TUBAL LIGATION (Bilateral )     Patient location during evaluation: PACU Anesthesia Type: Spinal Level of consciousness: awake and alert Pain management: pain level controlled Vital Signs Assessment: post-procedure vital signs reviewed and stable Respiratory status: spontaneous breathing, nonlabored ventilation and respiratory function stable Cardiovascular status: blood pressure returned to baseline and stable Postop Assessment: no apparent nausea or vomiting Anesthetic complications: no    Last Vitals:  Vitals:   06/30/19 1430 06/30/19 1445  BP: (!) 148/98 138/90  Pulse: 72 73  Resp: 11 12  Temp:    SpO2: 100% 100%    Last Pain:  Vitals:   06/30/19 1445  TempSrc:   PainSc: 0-No pain   Pain Goal:    LLE Motor Response: No movement due to regional block (06/30/19 1445)   RLE Motor Response: Purposeful movement (06/30/19 1445)       Epidural/Spinal Function Cutaneous sensation: Tingles (06/30/19 1445), Patient able to flex knees: No (06/30/19 1445), Patient able to lift hips off bed: No (06/30/19 1445), Back pain beyond tenderness at insertion site: No (06/30/19 1445), Progressively worsening motor and/or sensory loss: No (06/30/19 1445), Bowel and/or bladder incontinence post epidural: No (06/30/19 1445)  Lowella Curb

## 2019-06-30 NOTE — Discharge Summary (Signed)
Postpartum Discharge Summary      Patient Name: April Davenport DOB: Dec 19, 1990 MRN: [id number]  Date of admission: 06/30/2019 Delivering Provider: Verita Schneiders A   Date of discharge: 07/02/2019  Admitting diagnosis: Chronic hypertension affecting pregnancy [O10.919] Intrauterine pregnancy: [redacted]w[redacted]d    Secondary diagnosis:  Active Problems:   Essential hypertension   Obesity, Class III, BMI 40-49.9 (morbid obesity) ([place]   Preexisting hypertension complicating pregnancy, antepartum   History of low transverse cesarean section x 1   Unwanted fertility   Chronic hypertension affecting pregnancy  Additional problems: None     Discharge diagnosis: Term Pregnancy Delivered and CHTN                                                                                                Post partum procedures:Salpingectomy done intra-operatively  Augmentation: NA  Complications: None  Hospital course:  Sceduled C/S   29y.o. yo G3P0111 at [id number] admitted to the hospital 06/30/2019 for scheduled cesarean section with the following indication:Elective Repeat. Salpingectomy done intra-operatively. Membrane Rupture Time/Date: 12:24 PM ,06/30/2019   Patient delivered a Viable infant.06/30/2019  Details of operation can be found in separate operative note. Patient started on Norvasc 10 mg and BP's monitored. Pateint had an uncomplicated postpartum course.  She is ambulating, tolerating a regular diet, passing flatus, and urinating well. Patient is discharged home in stable condition on  07/02/19        Delivery time: 12:26 PM    Magnesium Sulfate received: No BMZ received: No Rhophylac:No MMR:No Transfusion:No  Physical exam  Vitals:   07/01/19 1849 07/01/19 1932 07/01/19 2344 07/02/19 0523  BP:  (!) 135/91 (!) 132/95 (!) 142/84  Pulse:  87 71 74  Resp:  18 18 18   Temp: 98.3 F (36.8 C)  98.1 F (36.7 C) 98.2 F (36.8 C)  TempSrc: Oral  Oral Oral  SpO2:  99%    Weight:       Height:       General: alert, cooperative and no distress  Chest: CTA Bilaterally, RRR Abdomen: Soft, Appropriately Tender, BS present.  Lochia: appropriate Uterine Fundus: firm Incision: Dressing is clean, dry, and intact DVT Evaluation: No significant calf/ankle edema. Labs: Lab Results  Component Value Date   WBC 13.4 (H) 07/01/2019   HGB 10.0 (L) 07/01/2019   HCT 30.6 (L) 07/01/2019   MCV 84.8 07/01/2019   PLT 271 07/01/2019   CMP Latest Ref Rng & Units 07/01/2019  Glucose 70 - 99 mg/dL 143(H)  BUN 6 - 20 mg/dL 8  Creatinine 0.44 - 1.00 mg/dL 0.67  Sodium 135 - 145 mmol/L 134(L)  Potassium 3.5 - 5.1 mmol/L 4.4  Chloride 98 - 111 mmol/L 104  CO2 22 - 32 mmol/L 22  Calcium 8.9 - 10.3 mg/dL 9.1  Total Protein 6.5 - 8.1 g/dL 5.1(L)  Total Bilirubin 0.3 - 1.2 mg/dL 0.3  Alkaline Phos 38 - 126 U/L 109  AST 15 - 41 U/L 20  ALT 0 - 44 U/L 13    Discharge instruction: per After Visit Summary and "Baby and Me Booklet".  Remove steri-strips in 7-10 days.  Incision care guidelines: how to clean, when to call, and anticipated healing.    After visit meds:  Allergies as of 07/02/2019      Reactions   Dilaudid [hydromorphone Hcl] Hives, Shortness Of Breath, Itching   Syncope   Black [employer], Swelling   Coconut Flavor Swelling      Medication List    STOP taking these medications   aspirin EC 81 MG tablet   Blood Pressure Cuff Misc   calcium carbonate 500 MG chewable tablet Commonly known as: TUMS - dosed in mg elemental calcium   labetalol 100 MG tablet Commonly known as: NORMODYNE     TAKE these medications   acetaminophen 325 MG tablet Commonly known as: TYLENOL Take 650 mg by mouth every 6 (six) hours as needed. What changed: Another medication with the same name was added. Make sure you understand how and when to take each.   acetaminophen 500 MG tablet Commonly known as: TYLENOL Take 2 tablets (1,000 mg total) by mouth every 6 (six) hours as  needed. What changed: You were already taking a medication with the same name, and this prescription was added. Make sure you understand how and when to take each.   amLODipine 10 MG tablet Commonly known as: NORVASC Take 1 tablet (10 mg total) by mouth daily.   ibuprofen 800 MG tablet Commonly known as: ADVIL Take 1 tablet (800 mg total) by mouth every 6 (six) hours as needed.   prenatal multivitamin Tabs tablet Take 1 tablet by mouth daily at 12 noon.   senna-docusate 8.6-50 MG tablet Commonly known as: Senokot-S Take 2 tablets by mouth 2 (two) times daily.       Diet: routine diet  Activity: Advance as tolerated. Pelvic rest for 6 weeks.   Outpatient follow up:4 weeks Follow up Appt: Future Appointments  Date Time Provider [place]  07/07/2019 10:00 AM [facility] NURSE [facility]  07/14/2019 10:30 AM [facility] NURSE [facility]  07/28/2019 10:30 AM Anyanwu, Sallyanne Havers, MD [facility]   Follow up Visit:   Please schedule this patient for Postpartum visit in: 4 weeks with the following provider: Any provider In-Person For C/S patients schedule nurse incision check in weeks 2 weeks: yes High risk pregnancy complicated by: HTN Delivery mode:  CS Anticipated Birth Control:  BTL done PP PP Procedures needed: BP and incision check  Schedule Integrated BH visit: no   Newborn Data: Live born female  Birth Weight: 8 lb 14.9 oz (4050 g) APGAR: 5, 6 Newborn Delivery   Birth date/time: 06/30/2019 12:26:00 Delivery type: C-Section, Low Transverse Trial of labor: No C-section categorization: Repeat      Baby Feeding: Breast Disposition:home with mother   07/02/2019 Maryann Conners, CNM

## 2019-06-30 NOTE — Op Note (Signed)
[name] PROCEDURE DATE: 06/30/2019  PREOPERATIVE DIAGNOSES: Intrauterine pregnancy at [redacted]w[redacted]d weeks gestation; chronic hypertension; undesired fertility  POSTOPERATIVE DIAGNOSES: The same  PROCEDURE:Repeat Low Transverse Cesarean Section, Bilateral Tubal Sterilization via Salpingectomy  SURGEON:  Dr. Verita Schneiders  ASSISTANT: Dr. Barrington Ellison  ANESTHESIOLOGIST: Anesthesiologist: Lynda Rainwater, MD CRNA: Asher Muir, CRNA  INDICATIONS: Artesia Berkey is a 29 y.o. [phone] at [redacted]w[redacted]d here for cesarean section and bilateral tubal sterilization secondary to the indications listed under preoperative diagnoses; please see preoperative note for further details.  The risks of surgery were discussed with the patient including but were not limited to: bleeding which may require transfusion or reoperation; infection which may require antibiotics; injury to bowel, bladder, ureters or other surrounding organs; injury to the fetus; need for additional procedures including hysterectomy in the event of a life-threatening hemorrhage; placental abnormalities wth subsequent pregnancies, incisional problems, thromboembolic phenomenon and other postoperative/anesthesia complications.  Patient also desires permanent sterilization.  Other reversible forms of contraception were discussed with patient; she declines all other modalities. Risks of procedure discussed with patient including but not limited to: risk of regret, permanence of method, bleeding, infection, injury to surrounding organs and need for additional procedures.  Failure risk of 1% with increased risk of ectopic gestation if pregnancy occurs was also discussed with patient.  Also discussed possibility of post-tubal pain syndrome. The patient concurred with the proposed plan, giving informed written consent for the procedures.    FINDINGS:  Viable female infant in cephalic presentation.  Baby had a difficult time transitioning, Neonatology managed the  baby and he had to be taken to NICU. Apgars pending at time of this summary.  Clear amniotic fluid.  Intact placenta, three vessel cord.  Normal uterus, fallopian tubes and ovaries bilaterally. Fallopian tubes were removed bilaterally. No intraperitoneal adhesive disease noted.  ANESTHESIA: Spinal INTRAVENOUS FLUIDS: 2300 ml ESTIMATED BLOOD LOSS: 500 ml URINE OUTPUT:  50 ml SPECIMENS: Placenta sent to L&D; bilateral fallopian tube fragments also sent to pathology COMPLICATIONS: None immediate  PROCEDURE IN DETAIL:  The patient preoperatively received intravenous antibiotics and had sequential compression devices applied to her lower extremities.   She was then taken to the operating room where spinal anesthesia was administered and was found to be adequate. She was then placed in a dorsal supine position with a leftward tilt, and prepped and draped in a sterile manner.  A foley catheter was placed into her bladder and attached to constant gravity.  After an adequate timeout was performed, a transverse abdominal skin incision was made with scalpel over her preexisting scar and carried through to the underlying layer of fascia. The fascia was incised in the midline, and this incision was extended bilaterally using the Mayo scissors.  Kocher clamps were applied to the superior aspect of the fascial incision and the underlying rectus muscles were dissected off bluntly.  A similar process was carried out on the inferior aspect of the fascial incision. The rectus muscles were separated in the midline and the peritoneum was entered bluntly. The Alexis self-retaining retractor was introduced into the abdominal cavity.  Attention was turned to the lower uterine segment where a low transverse hysterotomy was made with a scalpel and extended bilaterally bluntly.  The infant was successfully delivered, the cord was clamped and cut immediately, and the infant was handed over to the awaiting neonatology team. Uterine  massage was then administered, and the placenta delivered intact with a three-vessel cord. The uterus was then cleared of clots  and debris.  The hysterotomy was closed with 0 Vicryl in a running locked fashion, and an imbricating layer was also placed with 0 Vicryl.  Figure-of-eight 0 Vicryl serosal stitches were placed to help with hemostasis.  Attention was then turned to the left fallopian tube. Kelly forceps were placed on the mesosalpinx underneath most of the tube.  This pedicle was double suture ligated with 2-0 Vicryl, and the tube including the fimbriated end was excised.  The right fallopian tube was then identified, doubly ligated, and was excised in a similar fashion allowing for bilateral tubal sterilization via bilateral salpingectomy.  Good hemostasis was noted overall.  The pelvis was cleared of all clot and debris. Hemostasis was confirmed on all surfaces.  The retractor was removed.  The peritoneum was closed with a 2-0 Vicryl running stitch. The fascia was then closed using 0 PDS in a running fashion.  The subcutaneous layer was irrigated, reapproximated with 2-0 plain gut interrupted stitches, and the skin was closed with a 4-0 Vicryl subcuticular stitch. The patient tolerated the procedure well. Sponge, instrument and needle counts were correct x 3.  She was taken to the recovery room in stable condition.    Jaynie Collins, MD, FACOG Obstetrician & Gynecologist, [facility] for [employer], [facility]

## 2019-06-30 NOTE — Anesthesia Procedure Notes (Signed)
Spinal  Patient location during procedure: OB Start time: 06/30/2019 11:55 AM End time: 06/30/2019 12:00 PM Staffing Performed: anesthesiologist  Anesthesiologist: Lowella Curb, MD Preanesthetic Checklist Completed: patient identified, IV checked, risks and benefits discussed, surgical consent, monitors and equipment checked, pre-op evaluation and timeout performed Spinal Block Patient position: sitting Prep: DuraPrep and site prepped and draped Patient monitoring: heart rate, cardiac monitor, continuous pulse ox and blood pressure Approach: midline Location: L3-4 Injection technique: single-shot Needle Needle type: Pencan  Needle gauge: 24 G Needle length: 10 cm Assessment Sensory level: T4

## 2019-07-01 LAB — CBC
HCT: 30.6 % — ABNORMAL LOW (ref 36.0–46.0)
Hemoglobin: 10 g/dL — ABNORMAL LOW (ref 12.0–15.0)
MCH: 27.7 pg (ref 26.0–34.0)
MCHC: 32.7 g/dL (ref 30.0–36.0)
MCV: 84.8 fL (ref 80.0–100.0)
Platelets: 271 10*3/uL (ref 150–400)
RBC: 3.61 MIL/uL — ABNORMAL LOW (ref 3.87–5.11)
RDW: 13.4 % (ref 11.5–15.5)
WBC: 13.4 10*3/uL — ABNORMAL HIGH (ref 4.0–10.5)
nRBC: 0 % (ref 0.0–0.2)

## 2019-07-01 LAB — COMPREHENSIVE METABOLIC PANEL
ALT: 13 U/L (ref 0–44)
AST: 20 U/L (ref 15–41)
Albumin: 2.1 g/dL — ABNORMAL LOW (ref 3.5–5.0)
Alkaline Phosphatase: 109 U/L (ref 38–126)
Anion gap: 8 (ref 5–15)
BUN: 8 mg/dL (ref 6–20)
CO2: 22 mmol/L (ref 22–32)
Calcium: 9.1 mg/dL (ref 8.9–10.3)
Chloride: 104 mmol/L (ref 98–111)
Creatinine, Ser: 0.67 mg/dL (ref 0.44–1.00)
GFR calc Af Amer: >60 mL/min (ref >60)
GFR calc non Af Amer: >60 mL/min (ref >60)
Glucose, Bld: 143 mg/dL — ABNORMAL HIGH (ref 70–99)
Potassium: 4.4 mmol/L (ref 3.5–5.1)
Sodium: 134 mmol/L — ABNORMAL LOW (ref 135–145)
Total Bilirubin: 0.3 mg/dL (ref 0.3–1.2)
Total Protein: 5.1 g/dL — ABNORMAL LOW (ref 6.5–8.1)

## 2019-07-01 LAB — BIRTH TISSUE RECOVERY COLLECTION (PLACENTA DONATION)

## 2019-07-01 MED ORDER — ACETAMINOPHEN 325 MG PO TABS
650.0000 mg | ORAL_TABLET | Freq: Four times a day (QID) | ORAL | Status: DC | PRN
  Administered 2019-07-01 – 2019-07-02 (×3): 650 mg via ORAL
  Filled 2019-07-01 (×3): qty 2

## 2019-07-01 NOTE — Lactation Note (Signed)
This note was copied from a baby's chart. Lactation Consultation Note  Patient Name: Boy Wen Munford PIRJJ'O Date: 07/01/2019 Reason for consult: Follow-up assessment;Other (Comment)(transfered to [facility] from NICU this afternoon)   ETI transferred to [facility] this afternoon.  Mom breastfed previous child for 8 months without difficulty.  LC worked with mom to hand express.  Mom was able to express easily (approx. 3-64ml.  Of colostrum).  Collected in container and LC gave instructions to feed back to infant by 6-8 hours.  LC offered to assist with bf when infant was awake and looking around.  Infant placed STS in football hold.  He latched easily and swallows heard very easily with compression and massage. Good rhythmic jaw movement observed.  Infant fell asleep after bf.  Arms relaxed.  LC encouraged mom to continue to hand express and use the DEBP in order to feed back any EBM to infant.    Reviewed lactation services after DC.  Support group info, phone line and OP services shared with mom.  BF basics reviewed; 8-12X in 24 hours, feeding cues, and cluster feedings.  Encouraged mom to call out for further questions or assistance with bf.     Maternal Data Has patient been taught Hand Expression?: Yes Does the patient have breastfeeding experience prior to this delivery?: Yes  Feeding Feeding Type: Breast Fed  LATCH Score Latch: Grasps breast easily, tongue down, lips flanged, rhythmical sucking.  Audible Swallowing: A few with stimulation  Type of Nipple: Everted at rest and after stimulation  Comfort (Breast/Nipple): Soft / non-tender  Hold (Positioning): Assistance needed to correctly position infant at breast and maintain latch.  LATCH Score: 8  Interventions Interventions: Breast feeding basics reviewed;Assisted with latch;Skin to skin;Position options;Adjust position;Breast compression  Lactation Tools Discussed/Used     Consult Status Consult Status: Follow-up Date:  07/02/19 Follow-up type: In-patient    Maryruth Hancock [facility] 07/01/2019, 7:27 PM

## 2019-07-01 NOTE — Progress Notes (Addendum)
POSTPARTUM PROGRESS NOTE  Post Partum Day 1  Subjective:  April Davenport is a 29 y.o. K3T4656 s/p repeat scheduled CS at [redacted]w[redacted]d.  She reports she is doing well. No acute events overnight. She denies any problems with ambulating, voiding or po intake. Denies nausea or vomiting.  Pain is well controlled.  Lochia is appropriate.  Objective: Blood pressure 125/86, pulse 79, temperature 98.2 F (36.8 C), temperature source Oral, resp. rate 18, height 5\' 1"  (1.549 m), weight 113.4 kg, last menstrual period 10/06/2018, SpO2 100 %, unknown if currently breastfeeding.  Physical Exam:  General: alert, cooperative and no distress Chest: no respiratory distress Heart:regular rate, distal pulses intact Abdomen: soft, nontender,  Uterine Fundus: firm, appropriately tender DVT Evaluation: No calf swelling or tenderness Extremities: trace edema Skin: warm, dry  Recent Labs    06/28/19 0919 07/01/19 0502  HGB 11.7* 10.0*  HCT 35.9* 30.6*    Assessment/Plan: April Davenport is a 29 y.o. 26 s/p repeat scheduled CS at [redacted]w[redacted]d   PPD#1 - Doing well. Continue routine postpartum care. AM Hgb 10.0 (down from 11.7) Contraception: BTL Feeding: Both Dispo: Plan for discharge tomorrow.   LOS: 1 day    [redacted]w[redacted]d, DO, PGY-1 OBGYN Faculty Teaching Service  07/01/2019, 7:36 AM  GME ATTESTATION:  I saw and evaluated the patient. I agree with the findings and the plan of care as documented in the resident's note.  07/03/2019, DO OB Fellow, Faculty [facility], Center for [facility] 07/01/2019 8:52 AM

## 2019-07-02 MED ORDER — AMLODIPINE BESYLATE 10 MG PO TABS: 10.0000 mg | ORAL_TABLET | Freq: Every day | ORAL | 1 refills | Status: DC

## 2019-07-02 MED ORDER — ACETAMINOPHEN 500 MG PO TABS: 1000.0000 mg | ORAL_TABLET | Freq: Four times a day (QID) | ORAL | 2 refills | Status: DC | PRN

## 2019-07-02 MED ORDER — IBUPROFEN 800 MG PO TABS: 800.0000 mg | ORAL_TABLET | Freq: Four times a day (QID) | ORAL | 1 refills | Status: DC | PRN

## 2019-07-02 MED ORDER — SENNOSIDES-DOCUSATE SODIUM 8.6-50 MG PO TABS: 2.0000 | ORAL_TABLET | Freq: Two times a day (BID) | ORAL | 1 refills | Status: DC

## 2019-07-02 NOTE — Discharge Instructions (Signed)

## 2019-07-02 NOTE — Lactation Note (Signed)
This note was copied from a baby's chart. Lactation Consultation Note  Patient Name: April Davenport FWYOV'Z Date: 07/02/2019 Reason for consult: Follow-up assessment   P2, Baby 44 hours old.   Baby sucking on pacifier upon entering.  Provided education.  Pacifier use not recommended at this time.  Reviewed hand expression with good flow. Assisted with latching baby in football hold.  It took some patience but eventually baby sustained latch.   Mother reminded not to pull tissue back away from infant's nose.  Recommend mother breastfeed before offering formula to help establish milk supply and latch. Feed on demand with cues.  Goal 8-12+ times per day after first 24 hrs.  Place baby STS if not cueing.      Maternal Data Has patient been taught Hand Expression?: Yes  Feeding Feeding Type: Breast Fed Nipple Type: Slow - flow  LATCH Score Latch: Repeated attempts needed to sustain latch, nipple held in mouth throughout feeding, stimulation needed to elicit sucking reflex.  Audible Swallowing: A few with stimulation  Type of Nipple: Everted at rest and after stimulation  Comfort (Breast/Nipple): Soft / non-tender  Hold (Positioning): Assistance needed to correctly position infant at breast and maintain latch.  LATCH Score: 7  Interventions Interventions: Breast feeding basics reviewed;Assisted with latch;Skin to skin;Hand express;Adjust position;Support pillows;Position options  Lactation Tools Discussed/Used     Consult Status Consult Status: Follow-up Date: 07/03/19 Follow-up type: In-patient    [name] [facility] 07/02/2019, 9:17 AM

## 2019-07-04 LAB — SURGICAL PATHOLOGY

## 2019-07-07 ENCOUNTER — Telehealth (INDEPENDENT_AMBULATORY_CARE_PROVIDER_SITE_OTHER): Payer: Federal, State, Local not specified - PPO | Admitting: *Deleted

## 2019-07-07 ENCOUNTER — Other Ambulatory Visit: Payer: Self-pay

## 2019-07-07 DIAGNOSIS — O10919 Unspecified pre-existing hypertension complicating pregnancy, unspecified trimester: Secondary | ICD-10-CM

## 2019-07-07 DIAGNOSIS — O1092 Unspecified pre-existing hypertension complicating childbirth: Secondary | ICD-10-CM

## 2019-07-07 NOTE — Progress Notes (Signed)
Called pt for her BP check. Pt took meds this am around 11:30 and her BP was 171/109, later 154/110 and then 15 min alter 145/119. Denies any HA, SOB, vision changes.  Pt also stated she just started taking her BP meds yesterday.  Discussed BP's with Dr Shawnie Pons, to have pt come in office tomorrow to recheck BP as it does take a few days for her meds to start working.  Pt verbalizes and understands.

## 2019-07-08 ENCOUNTER — Ambulatory Visit: Payer: Federal, State, Local not specified - PPO | Admitting: *Deleted

## 2019-07-08 ENCOUNTER — Other Ambulatory Visit: Payer: Self-pay

## 2019-07-08 VITALS — BP 136/94

## 2019-07-08 DIAGNOSIS — O10919 Unspecified pre-existing hypertension complicating pregnancy, unspecified trimester: Secondary | ICD-10-CM

## 2019-07-08 MED ORDER — CYCLOBENZAPRINE HCL 10 MG PO TABS: 10.0000 mg | ORAL_TABLET | Freq: Three times a day (TID) | ORAL | 0 refills | Status: DC | PRN

## 2019-07-08 NOTE — Progress Notes (Signed)
Subjective:  April Davenport is a 29 y.o. female here for BP check.   Hypertension ROS: taking medications as instructed, no medication side effects noted, no TIA's, no chest pain on exertion, no dyspnea on exertion and no swelling of ankles. Pt does complain of having a HA since last night.   Objective:  BP (!) 136/94   Appearance alert, well appearing, and in no distress. General exam BP noted to be better  controlled today in office.    Assessment:   Blood Pressure improving.   Plan:  Dr Shawnie Pons notified of BP and HA. To call in rx for flexeril for HA and to have virtual visit for BP check on Monday. Marland Kitchen

## 2019-07-08 NOTE — Progress Notes (Signed)
Patient seen and assessed by nursing staff.  Agree with documentation and plan.  

## 2019-07-11 ENCOUNTER — Other Ambulatory Visit: Payer: Self-pay

## 2019-07-11 ENCOUNTER — Ambulatory Visit (INDEPENDENT_AMBULATORY_CARE_PROVIDER_SITE_OTHER): Payer: Federal, State, Local not specified - PPO | Admitting: *Deleted

## 2019-07-11 ENCOUNTER — Encounter: Payer: Self-pay | Admitting: *Deleted

## 2019-07-11 DIAGNOSIS — O10919 Unspecified pre-existing hypertension complicating pregnancy, unspecified trimester: Secondary | ICD-10-CM

## 2019-07-11 MED ORDER — LISINOPRIL 10 MG PO TABS: 10.0000 mg | ORAL_TABLET | Freq: Every day | ORAL | 1 refills | Status: DC

## 2019-07-11 NOTE — Progress Notes (Signed)
Patient seen and assessed by nursing staff.  Agree with documentation and plan.  

## 2019-07-11 NOTE — Progress Notes (Signed)
Called pt to do her BP check.   BP today at home is 141/108. Pt states her HA is better and denies any other issues.   Will discuss with Dr Shawnie Pons.   To add another medication and recheck BP on Thursday or Friday.   Pt verbalizes and understands.   Scheryl Marten, RN

## 2019-07-14 ENCOUNTER — Ambulatory Visit (INDEPENDENT_AMBULATORY_CARE_PROVIDER_SITE_OTHER): Payer: Federal, State, Local not specified - PPO | Admitting: *Deleted

## 2019-07-14 ENCOUNTER — Other Ambulatory Visit: Payer: Self-pay

## 2019-07-14 VITALS — BP 119/84 | HR 94 | Wt 216.0 lb

## 2019-07-14 DIAGNOSIS — O10919 Unspecified pre-existing hypertension complicating pregnancy, unspecified trimester: Secondary | ICD-10-CM

## 2019-07-14 NOTE — Progress Notes (Signed)
Patient seen and assessed by nursing staff during this encounter. I have reviewed the chart and agree with the documentation and plan.  Jaynie Collins, MD 07/14/2019 12:50 PM

## 2019-07-14 NOTE — Progress Notes (Signed)
Pt here today for a BP recheck and incision check.   BP today in the office was 119/84.  Pt denies any HA, vision changes, SOB. Pt states she is feeling much better.   Incision healing well, no drainage noted. Area does have some erythema from the tape.  Pt to continue meds as directed. To call as needed and to follow up at postpartum visit.  Scheryl Marten, RN

## 2019-07-27 ENCOUNTER — Telehealth: Payer: Self-pay | Admitting: *Deleted

## 2019-07-27 NOTE — Telephone Encounter (Signed)
Left message for pt to call office to reschedule appointment.

## 2019-07-28 ENCOUNTER — Ambulatory Visit: Payer: Federal, State, Local not specified - PPO | Admitting: Obstetrics & Gynecology

## 2019-08-02 ENCOUNTER — Ambulatory Visit: Payer: Federal, State, Local not specified - PPO

## 2019-08-09 ENCOUNTER — Other Ambulatory Visit: Payer: Self-pay

## 2019-08-09 ENCOUNTER — Telehealth (INDEPENDENT_AMBULATORY_CARE_PROVIDER_SITE_OTHER): Payer: Federal, State, Local not specified - PPO | Admitting: Obstetrics and Gynecology

## 2019-08-09 DIAGNOSIS — R7989 Other specified abnormal findings of blood chemistry: Secondary | ICD-10-CM

## 2019-08-09 MED ORDER — TRANEXAMIC ACID 650 MG PO TABS: 1300.0000 mg | ORAL_TABLET | Freq: Three times a day (TID) | ORAL | 0 refills | Status: DC

## 2019-08-09 NOTE — Progress Notes (Signed)
   TELEHEALTH VIRTUAL POSTPARTUM VISIT ENCOUNTER NOTE  I connected with April Davenport on 08/09/19 at 10:45 AM EST by telephone at home and verified that I am speaking with the correct person using two identifiers.   I discussed the limitations, risks, security and privacy concerns of performing an evaluation and management service by telephone and the availability of in person appointments. I also discussed with the patient that there may be a patient responsible charge related to this service. The patient expressed understanding and agreed to proceed.  Chief Complaint: regular PP visit  History:  April Davenport is a 29 y.o. P5T6144 s/p 1/21 rLTCS and b/l salpingectomy. Preg c/b cHTN  Patient is exclusively breastfeeding. She's had AUB since delivery and is now old brown d/c. She is also having some right sided cramping and bleeding seems like it picked up today.   EPDS score zero     Past Medical History:  Diagnosis Date  . Hypertension   . Right nephrolithiasis    Past Surgical History:  Procedure Laterality Date  . CESAREAN SECTION  05/16/2013  . CESAREAN SECTION WITH BILATERAL TUBAL LIGATION Bilateral 06/30/2019   Procedure: CESAREAN SECTION WITH BILATERAL TUBAL LIGATION;  Surgeon: Tereso Newcomer, MD;  Location: [facility] LD ORS;  Service: Obstetrics;  Laterality: Bilateral;  . KNEE SURGERY Right    The following portions of the patient's history were reviewed and updated as appropriate: allergies, current medications, past family history, past medical history, past social history, past surgical history and problem list.    Review of Systems:  Pertinent items noted in HPI and remainder of comprehensive ROS otherwise negative.  Physical Exam:   General:  Alert, oriented and cooperative.   Mental Status: Normal mood and affect perceived. Normal judgment and thought content.  Physical exam deferred due to nature of the encounter  Labs and Imaging No results found for this or any  previous visit (from the past 336 hour(s)). No results found.    Assessment and Plan:     1. AUB Could be period starting although it seems like she would have lactational amenorrhea. Will do a course of lysteda and if aub continues, pt told to call for in person visit  2. Postpartum care and examination Pap UTD  3. Low TSH level Recommend recheck if aub continues  4. cHTN Continue lisinopril. Pt to make PCP appt and stay on med for now       I discussed the assessment and treatment plan with the patient. The patient was provided an opportunity to ask questions and all were answered. The patient agreed with the plan and demonstrated an understanding of the instructions.   The patient was advised to call back or seek an in-person evaluation/go to the ED if the symptoms worsen or if the condition fails to improve as anticipated.  I provided 15 minutes of non-face-to-face time during this encounter. The visit was done via a Doximity video visit.    [place], MD Center for [employer], [facility]

## 2019-08-09 NOTE — Progress Notes (Signed)
error 

## 2019-08-09 NOTE — Progress Notes (Signed)
I connected with  April Davenport on 08/09/19 at 10:45 AM EST by telephone and verified that I am speaking with the correct person using two identifiers.   I discussed the limitations, risks, security and privacy concerns of performing an evaluation and management service by telephone and the availability of in person appointments. I also discussed with the patient that there may be a patient responsible charge related to this service. The patient expressed understanding and agreed to proceed.  Scheryl Marten, RN 08/09/2019  10:37 AM

## 2019-08-10 ENCOUNTER — Telehealth: Payer: Self-pay | Admitting: *Deleted

## 2019-08-10 DIAGNOSIS — O99345 Other mental disorders complicating the puerperium: Secondary | ICD-10-CM

## 2019-08-10 DIAGNOSIS — F53 Postpartum depression: Secondary | ICD-10-CM

## 2019-08-10 NOTE — Telephone Encounter (Signed)
Pt called today, after thinking more about her postpartum visit and depression screen, she does feel that she wants to seek help and would like to talk to a counselor. She does have an appointment with her PCP in April but does not want to wait until then. Informed pt that we have counselors she can talk with and that I would place the referral for her to get that appointment set up.

## 2019-08-17 ENCOUNTER — Other Ambulatory Visit: Payer: Self-pay

## 2019-08-17 ENCOUNTER — Ambulatory Visit (INDEPENDENT_AMBULATORY_CARE_PROVIDER_SITE_OTHER): Payer: Federal, State, Local not specified - PPO | Admitting: Licensed Clinical Social Worker

## 2019-08-17 DIAGNOSIS — F4321 Adjustment disorder with depressed mood: Secondary | ICD-10-CM

## 2019-08-25 NOTE — BH Specialist Note (Signed)
Integrated Behavioral Health Initial Visit  MRN: [id number] Name: April Davenport  Number of Integrated Behavioral Health Clinician visits:: 1 Session Start time: 2:30pm   Session End time: 3:00pm Total time: 30 minutes   Type of Service: Integrated Behavioral Health- Individual Interpretor:no Interpretor Name and Language: none   Warm Hand Off Completed.       SUBJECTIVE: Laurieanne Galloway is a 29 y.o. female  Patient was referred by Dr. Macon Large  for depression. Patient reports the following symptoms/concerns: isolation, and depressed mood  Duration of problem: ; Severity of problem: mild  OBJECTIVE: Mood and Affect are good  Risk of harm to self or others: No risk of harm to self or others   LIFE CONTEXT: Family and Social: Lives with fiance  School/Work: on maternity leave  Self-Care: n/a Life Changes: Newborn   GOALS ADDRESSED: Patient will: 1. Reduce symptoms of: depression 2. Increase knowledge and/or ability of: identifying triggers 3. Demonstrate ability to: self manage symptoms   INTERVENTIONS: Interventions utilized: brief supportive counseling   Standardized Assessments completed: edinburgh completed 08/09/2019  ASSESSMENT: Patient currently experiencing adjustment disorder with depressed mood    Patient may benefit from integrated behavioral health  PLAN: 1. Follow up with behavioral health clinician on : two weeks  2. Behavioral recommendations: prioritize sleep, deep breathing exercises, increase physical activity, and participate safely with support groups to increase social interactions  3. Referral(s): [facility] on main  4. "From scale of 1-10, how likely are you to follow plan?":   Gwyndolyn Saxon, LCSW

## 2019-09-01 ENCOUNTER — Encounter: Payer: Self-pay | Admitting: *Deleted

## 2019-09-08 ENCOUNTER — Encounter: Payer: Federal, State, Local not specified - PPO | Admitting: Licensed Clinical Social Worker

## 2019-09-13 ENCOUNTER — Other Ambulatory Visit: Payer: Self-pay

## 2019-09-13 ENCOUNTER — Encounter: Payer: Self-pay | Admitting: Adult Health

## 2019-09-13 ENCOUNTER — Ambulatory Visit (INDEPENDENT_AMBULATORY_CARE_PROVIDER_SITE_OTHER): Payer: Federal, State, Local not specified - PPO | Admitting: Adult Health

## 2019-09-13 VITALS — BP 117/82 | HR 92 | Temp 96.8°F | Ht 61.0 in | Wt 197.8 lb

## 2019-09-13 DIAGNOSIS — E559 Vitamin D deficiency, unspecified: Secondary | ICD-10-CM | POA: Insufficient documentation

## 2019-09-13 DIAGNOSIS — I10 Essential (primary) hypertension: Secondary | ICD-10-CM

## 2019-09-13 DIAGNOSIS — Z8759 Personal history of other complications of pregnancy, childbirth and the puerperium: Secondary | ICD-10-CM | POA: Diagnosis not present

## 2019-09-13 DIAGNOSIS — Z8639 Personal history of other endocrine, nutritional and metabolic disease: Secondary | ICD-10-CM | POA: Insufficient documentation

## 2019-09-13 DIAGNOSIS — Z0000 Encounter for general adult medical examination without abnormal findings: Secondary | ICD-10-CM | POA: Diagnosis not present

## 2019-09-13 DIAGNOSIS — H6502 Acute serous otitis media, left ear: Secondary | ICD-10-CM | POA: Insufficient documentation

## 2019-09-13 DIAGNOSIS — R7303 Prediabetes: Secondary | ICD-10-CM

## 2019-09-13 DIAGNOSIS — Z6837 Body mass index (BMI) 37.0-37.9, adult: Secondary | ICD-10-CM

## 2019-09-13 DIAGNOSIS — F4541 Pain disorder exclusively related to psychological factors: Secondary | ICD-10-CM | POA: Diagnosis not present

## 2019-09-13 DIAGNOSIS — J302 Other seasonal allergic rhinitis: Secondary | ICD-10-CM | POA: Insufficient documentation

## 2019-09-13 DIAGNOSIS — D649 Anemia, unspecified: Secondary | ICD-10-CM | POA: Insufficient documentation

## 2019-09-13 DIAGNOSIS — J0110 Acute frontal sinusitis, unspecified: Secondary | ICD-10-CM | POA: Insufficient documentation

## 2019-09-13 LAB — POCT URINALYSIS DIPSTICK
Blood, UA: NEGATIVE
Glucose, UA: NEGATIVE
Ketones, UA: NEGATIVE
Leukocytes, UA: NEGATIVE
Nitrite, UA: NEGATIVE
Protein, UA: POSITIVE — AB
Spec Grav, UA: >=1.03 — AB (ref 1.010–1.025)
Urobilinogen, UA: 0.2 E.U./dL
pH, UA: 5 (ref 5.0–8.0)

## 2019-09-13 MED ORDER — FLUTICASONE PROPIONATE 50 MCG/ACT NA SUSP: 2.0000 | Freq: Every day | NASAL | 1 refills | Status: DC

## 2019-09-13 MED ORDER — AMOXICILLIN 875 MG PO TABS: 875.0000 mg | ORAL_TABLET | Freq: Two times a day (BID) | ORAL | 0 refills | Status: DC

## 2019-09-13 MED ORDER — LORATADINE 10 MG PO TABS: 10.0000 mg | ORAL_TABLET | Freq: Every day | ORAL | 0 refills | Status: DC

## 2019-09-13 NOTE — Patient Instructions (Addendum)
Carpal Tunnel Syndrome  Carpal tunnel syndrome is a condition that causes pain in your hand and arm. The carpal tunnel is a narrow area that is on the palm side of your wrist. Repeated wrist motion or certain diseases may cause swelling in the tunnel. This swelling can pinch the main nerve in the wrist (median nerve). What are the causes? This condition may be caused by:  Repeated wrist motions.  Wrist injuries.  Arthritis.  A sac of fluid (cyst) or abnormal growth (tumor) in the carpal tunnel.  Fluid buildup during pregnancy. Sometimes the cause is not known. What increases the risk? The following factors may make you more likely to develop this condition:  Having a job in which you move your wrist in the same way many times. This includes jobs like being a butcher or a cashier.  Being a woman.  Having other health conditions, such as: ? Diabetes. ? Obesity. ? A thyroid gland that is not active enough (hypothyroidism). ? Kidney failure. What are the signs or symptoms? Symptoms of this condition include:  A tingling feeling in your fingers.  Tingling or a loss of feeling (numbness) in your hand.  Pain in your entire arm. This pain may get worse when you bend your wrist and elbow for a long time.  Pain in your wrist that goes up your arm to your shoulder.  Pain that goes down into your palm or fingers.  A weak feeling in your hands. You may find it hard to grab and hold items. You may feel worse at night. How is this diagnosed? This condition is diagnosed with a medical history and physical exam. You may also have tests, such as:  Electromyogram (EMG). This test checks the signals that the nerves send to the muscles.  Nerve conduction study. This test checks how well signals pass through your nerves.  Imaging tests, such as X-rays, ultrasound, and MRI. These tests check for what might be the cause of your condition. How is this treated? This condition may be treated  with:  Lifestyle changes. You will be asked to stop or change the activity that caused your problem.  Doing exercise and activities that make bones and muscles stronger (physical therapy).  Learning how to use your hand again (occupational therapy).  Medicines for pain and swelling (inflammation). You may have injections in your wrist.  A wrist splint.  Surgery. Follow these instructions at home: If you have a splint:  Wear the splint as told by your doctor. Remove it only as told by your doctor.  Loosen the splint if your fingers: ? Tingle. ? Lose feeling (become numb). ? Turn cold and blue.  Keep the splint clean.  If the splint is not waterproof: ? Do not let it get wet. ? Cover it with a watertight covering when you take a bath or a shower. Managing pain, stiffness, and swelling   If told, put ice on the painful area: ? If you have a removable splint, remove it as told by your doctor. ? Put ice in a plastic bag. ? Place a towel between your skin and the bag. ? Leave the ice on for 20 minutes, 2-3 times per day. General instructions  Take over-the-counter and prescription medicines only as told by your doctor.  Rest your wrist from any activity that may cause pain. If needed, talk with your boss at work about changes that can help your wrist heal.  Do any exercises as told by your doctor,   physical therapist, or occupational therapist.  Keep all follow-up visits as told by your doctor. This is important. Contact a doctor if:  You have new symptoms.  Medicine does not help your pain.  Your symptoms get worse. Get help right away if:  You have very bad numbness or tingling in your wrist or hand. Summary  Carpal tunnel syndrome is a condition that causes pain in your hand and arm.  It is often caused by repeated wrist motions.  Lifestyle changes and medicines are used to treat this problem. Surgery may help in very bad cases.  Follow your doctor's  instructions about wearing a splint, resting your wrist, keeping follow-up visits, and calling for help. This information is not intended to replace advice given to you by your health care provider. Make sure you discuss any questions you have with your health care provider. Document Revised: 10/02/2017 Document Reviewed: 10/02/2017 Elsevier Patient Education  2020 [employer]. Health Maintenance, Female Adopting a healthy lifestyle and getting preventive care are important in promoting health and wellness. Ask your health care provider about:  The right schedule for you to have regular tests and exams.  Things you can do on your own to prevent diseases and keep yourself healthy. What should I know about diet, weight, and exercise? Eat a healthy diet   Eat a diet that includes plenty of vegetables, fruits, low-fat dairy products, and lean protein.  Do not eat a lot of foods that are high in solid fats, added sugars, or sodium. Maintain a healthy weight Body mass index (BMI) is used to identify weight problems. It estimates body fat based on height and weight. Your health care provider can help determine your BMI and help you achieve or maintain a healthy weight. Get regular exercise Get regular exercise. This is one of the most important things you can do for your health. Most adults should:  Exercise for at least 150 minutes each week. The exercise should increase your heart rate and make you sweat (moderate-intensity exercise).  Do strengthening exercises at least twice a week. This is in addition to the moderate-intensity exercise.  Spend less time sitting. Even light physical activity can be beneficial. Watch cholesterol and blood lipids Have your blood tested for lipids and cholesterol at 31 years of age, then have this test every 5 years. Have your cholesterol levels checked more often if:  Your lipid or cholesterol levels are high.  You are older than 32 years of age.  You  are at high risk for heart disease. What should I know about cancer screening? Depending on your health history and family history, you may need to have cancer screening at various ages. This may include screening for:  Breast cancer.  Cervical cancer.  Colorectal cancer.  Skin cancer.  Lung cancer. What should I know about heart disease, diabetes, and high blood pressure? Blood pressure and heart disease  High blood pressure causes heart disease and increases the risk of stroke. This is more likely to develop in people who have high blood pressure readings, are of African descent, or are overweight.  Have your blood pressure checked: ? Every 3-5 years if you are 54-74 years of age. ? Every year if you are 74 years old or older. Diabetes Have regular diabetes screenings. This checks your fasting blood sugar level. Have the screening done:  Once every three years after age 17 if you are at a normal weight and have a low risk for diabetes.  More  often and at a younger age if you are overweight or have a high risk for diabetes. What should I know about preventing infection? Hepatitis B If you have a higher risk for hepatitis B, you should be screened for this virus. Talk with your health care provider to find out if you are at risk for hepatitis B infection. Hepatitis C Testing is recommended for:  Everyone born from [birth year] through 1967.  Anyone with known risk factors for hepatitis C. Sexually transmitted infections (STIs)  Get screened for STIs, including gonorrhea and chlamydia, if: ? You are sexually active and are younger than 31 years of age. ? You are older than 30 years of age and your health care provider tells you that you are at risk for this type of infection. ? Your sexual activity has changed since you were last screened, and you are at increased risk for chlamydia or gonorrhea. Ask your health care provider if you are at risk.  Ask your health care provider about  whether you are at high risk for HIV. Your health care provider may recommend a prescription medicine to help prevent HIV infection. If you choose to take medicine to prevent HIV, you should first get tested for HIV. You should then be tested every 3 months for as long as you are taking the medicine. Pregnancy  If you are about to stop having your period (premenopausal) and you may become pregnant, seek counseling before you get pregnant.  Take 400 to 800 micrograms (mcg) of folic acid every day if you become pregnant.  Ask for birth control (contraception) if you want to prevent pregnancy. Osteoporosis and menopause Osteoporosis is a disease in which the bones lose minerals and strength with aging. This can result in bone fractures. If you are 67 years old or older, or if you are at risk for osteoporosis and fractures, ask your health care provider if you should:  Be screened for bone loss.  Take a calcium or vitamin D supplement to lower your risk of fractures.  Be given hormone replacement therapy (HRT) to treat symptoms of menopause. Follow these instructions at home: Lifestyle  Do not use any products that contain nicotine or tobacco, such as cigarettes, e-cigarettes, and chewing tobacco. If you need help quitting, ask your health care provider.  Do not use street drugs.  Do not share needles.  Ask your health care provider for help if you need support or information about quitting drugs. Alcohol use  Do not drink alcohol if: ? Your health care provider tells you not to drink. ? You are pregnant, may be pregnant, or are planning to become pregnant.  If you drink alcohol: ? Limit how much you use to 0-1 drink a day. ? Limit intake if you are breastfeeding.  Be aware of how much alcohol is in your drink. In the [place] one drink equals one 12 oz bottle of beer (355 mL), one 5 oz glass of wine (148 mL), or one 1 oz glass of hard liquor (44 mL). General instructions  Schedule  regular health, dental, and eye exams.  Stay current with your vaccines.  Tell your health care provider if: ? You often feel depressed. ? You have ever been abused or do not feel safe at home. Summary  Adopting a healthy lifestyle and getting preventive care are important in promoting health and wellness.  Follow your health care provider's instructions about healthy diet, exercising, and getting tested or screened for diseases.  Follow your  health care provider's instructions on monitoring your cholesterol and blood pressure. This information is not intended to replace advice given to you by your health care provider. Make sure you discuss any questions you have with your health care provider. Document Revised: 05/19/2018 Document Reviewed: 05/19/2018 Elsevier Patient Education  2020 Elsevier Inc.   Fat and Cholesterol Restricted Eating Plan Getting too much fat and cholesterol in your diet may cause health problems. Choosing the right foods helps keep your fat and cholesterol at normal levels. This can keep you from getting certain diseases. Your doctor may recommend an eating plan that includes:  Total fat: ______% or less of total calories a day.  Saturated fat: ______% or less of total calories a day.  Cholesterol: less than _________mg a day.  Fiber: ______g a day. What are tips for following this plan? Meal planning  At meals, divide your plate into four equal parts: ? Fill one-half of your plate with vegetables and green salads. ? Fill one-fourth of your plate with whole grains. ? Fill one-fourth of your plate with low-fat (lean) protein foods.  Eat fish that is high in omega-3 fats at least two times a week. This includes mackerel, tuna, sardines, and salmon.  Eat foods that are high in fiber, such as whole grains, beans, apples, broccoli, carrots, peas, and barley. General tips   Work with your doctor to lose weight if you need to.  Avoid: ? Foods with added  sugar. ? Fried foods. ? Foods with partially hydrogenated oils.  Limit alcohol intake to no more than 1 drink a day for nonpregnant women and 2 drinks a day for men. One drink equals 12 oz of beer, 5 oz of wine, or 1 oz of hard liquor. Reading food labels  Check food labels for: ? Trans fats. ? Partially hydrogenated oils. ? Saturated fat (g) in each serving. ? Cholesterol (mg) in each serving. ? Fiber (g) in each serving.  Choose foods with healthy fats, such as: ? Monounsaturated fats. ? Polyunsaturated fats. ? Omega-3 fats.  Choose grain products that have whole grains. Look for the word "whole" as the first word in the ingredient list. Cooking  Cook foods using low-fat methods. These include baking, boiling, grilling, and broiling.  Eat more home-cooked foods. Eat at restaurants and buffets less often.  Avoid cooking using saturated fats, such as butter, cream, palm oil, palm kernel oil, and coconut oil. Recommended foods  Fruits  All fresh, canned (in natural juice), or frozen fruits. Vegetables  Fresh or frozen vegetables (raw, steamed, roasted, or grilled). Green salads. Grains  Whole grains, such as whole wheat or whole grain breads, crackers, cereals, and pasta. Unsweetened oatmeal, bulgur, barley, quinoa, or brown rice. Corn or whole wheat flour tortillas. Meats and other protein foods  Ground beef (85% or leaner), grass-fed beef, or beef trimmed of fat. Skinless chicken or [place]. Ground chicken or [place]. Pork trimmed of fat. All fish and seafood. Egg whites. Dried beans, peas, or lentils. Unsalted nuts or seeds. Unsalted canned beans. Nut butters without added sugar or oil. Dairy  Low-fat or nonfat dairy products, such as skim or 1% milk, 2% or reduced-fat cheeses, low-fat and fat-free ricotta or cottage cheese, or plain low-fat and nonfat yogurt. Fats and oils  Tub margarine without trans fats. Light or reduced-fat mayonnaise and salad dressings. Avocado.  Olive, canola, sesame, or safflower oils. The items listed above may not be a complete list of foods and beverages you can eat. Contact  a dietitian for more information. Foods to avoid Fruits  Canned fruit in heavy syrup. Fruit in cream or butter sauce. Fried fruit. Vegetables  Vegetables cooked in cheese, cream, or butter sauce. Fried vegetables. Grains  White bread. White pasta. White rice. Cornbread. Bagels, pastries, and croissants. Crackers and snack foods that contain trans fat and hydrogenated oils. Meats and other protein foods  Fatty cuts of meat. Ribs, chicken wings, bacon, sausage, bologna, salami, chitterlings, fatback, hot dogs, bratwurst, and packaged lunch meats. Liver and organ meats. Whole eggs and egg yolks. Chicken and [place] with skin. Fried meat. Dairy  Whole or 2% milk, cream, half-and-half, and cream cheese. Whole milk cheeses. Whole-fat or sweetened yogurt. Full-fat cheeses. Nondairy creamers and whipped toppings. Processed cheese, cheese spreads, and cheese curds. Beverages  Alcohol. Sugar-sweetened drinks such as sodas, lemonade, and fruit drinks. Fats and oils  Butter, stick margarine, lard, shortening, ghee, or bacon fat. Coconut, palm kernel, and palm oils. Sweets and desserts  Corn syrup, sugars, honey, and molasses. Candy. Jam and jelly. Syrup. Sweetened cereals. Cookies, pies, cakes, donuts, muffins, and ice cream. The items listed above may not be a complete list of foods and beverages you should avoid. Contact a dietitian for more information. Summary  Choosing the right foods helps keep your fat and cholesterol at normal levels. This can keep you from getting certain diseases.  At meals, fill one-half of your plate with vegetables and green salads.  Eat high-fiber foods, like whole grains, beans, apples, carrots, peas, and barley.  Limit added sugar, saturated fats, alcohol, and fried foods. This information is not intended to replace advice  given to you by your health care provider. Make sure you discuss any questions you have with your health care provider. Document Revised: 01/27/2018 Document Reviewed: 02/10/2017 Elsevier Patient Education  2020 Elsevier Inc.  DASH Eating Plan DASH stands for "Dietary Approaches to Stop Hypertension." The DASH eating plan is a healthy eating plan that has been shown to reduce high blood pressure (hypertension). It may also reduce your risk for type 2 diabetes, heart disease, and stroke. The DASH eating plan may also help with weight loss. What are tips for following this plan?  General guidelines  Avoid eating more than 2,300 mg (milligrams) of salt (sodium) a day. If you have hypertension, you may need to reduce your sodium intake to 1,500 mg a day.  Limit alcohol intake to no more than 1 drink a day for nonpregnant women and 2 drinks a day for men. One drink equals 12 oz of beer, 5 oz of wine, or 1 oz of hard liquor.  Work with your health care provider to maintain a healthy body weight or to lose weight. Ask what an ideal weight is for you.  Get at least 30 minutes of exercise that causes your heart to beat faster (aerobic exercise) most days of the week. Activities may include walking, swimming, or biking.  Work with your health care provider or diet and nutrition specialist (dietitian) to adjust your eating plan to your individual calorie needs. Reading food labels   Check food labels for the amount of sodium per serving. Choose foods with less than 5 percent of the Daily Value of sodium. Generally, foods with less than 300 mg of sodium per serving fit into this eating plan.  To find whole grains, look for the word "whole" as the first word in the ingredient list. Shopping  Buy products labeled as "low-sodium" or "no salt added."  Buy  fresh foods. Avoid canned foods and premade or frozen meals. Cooking  Avoid adding salt when cooking. Use salt-free seasonings or herbs instead  of table salt or sea salt. Check with your health care provider or pharmacist before using salt substitutes.  Do not fry foods. Cook foods using healthy methods such as baking, boiling, grilling, and broiling instead.  Cook with heart-healthy oils, such as olive, canola, soybean, or sunflower oil. Meal planning  Eat a balanced diet that includes: ? 5 or more servings of fruits and vegetables each day. At each meal, try to fill half of your plate with fruits and vegetables. ? Up to 6-8 servings of whole grains each day. ? Less than 6 oz of lean meat, poultry, or fish each day. A 3-oz serving of meat is about the same size as a deck of cards. One egg equals 1 oz. ? 2 servings of low-fat dairy each day. ? A serving of nuts, seeds, or beans 5 times each week. ? Heart-healthy fats. Healthy fats called Omega-3 fatty acids are found in foods such as flaxseeds and coldwater fish, like sardines, salmon, and mackerel.  Limit how much you eat of the following: ? Canned or prepackaged foods. ? Food that is high in trans fat, such as fried foods. ? Food that is high in saturated fat, such as fatty meat. ? Sweets, desserts, sugary drinks, and other foods with added sugar. ? Full-fat dairy products.  Do not salt foods before eating.  Try to eat at least 2 vegetarian meals each week.  Eat more home-cooked food and less restaurant, buffet, and fast food.  When eating at a restaurant, ask that your food be prepared with less salt or no salt, if possible. What foods are recommended? The items listed may not be a complete list. Talk with your dietitian about what dietary choices are best for you. Grains Whole-grain or whole-wheat bread. Whole-grain or whole-wheat pasta. Brown rice. Orpah Cobb. Bulgur. Whole-grain and low-sodium cereals. Pita bread. Low-fat, low-sodium crackers. Whole-wheat flour tortillas. Vegetables Fresh or frozen vegetables (raw, steamed, roasted, or grilled). Low-sodium or  reduced-sodium tomato and vegetable juice. Low-sodium or reduced-sodium tomato sauce and tomato paste. Low-sodium or reduced-sodium canned vegetables. Fruits All fresh, dried, or frozen fruit. Canned fruit in natural juice (without added sugar). Meat and other protein foods Skinless chicken or [place]. Ground chicken or [place]. Pork with fat trimmed off. Fish and seafood. Egg whites. Dried beans, peas, or lentils. Unsalted nuts, nut butters, and seeds. Unsalted canned beans. Lean cuts of beef with fat trimmed off. Low-sodium, lean deli meat. Dairy Low-fat (1%) or fat-free (skim) milk. Fat-free, low-fat, or reduced-fat cheeses. Nonfat, low-sodium ricotta or cottage cheese. Low-fat or nonfat yogurt. Low-fat, low-sodium cheese. Fats and oils Soft margarine without trans fats. Vegetable oil. Low-fat, reduced-fat, or light mayonnaise and salad dressings (reduced-sodium). Canola, safflower, olive, soybean, and sunflower oils. Avocado. Seasoning and other foods Herbs. Spices. Seasoning mixes without salt. Unsalted popcorn and pretzels. Fat-free sweets. What foods are not recommended? The items listed may not be a complete list. Talk with your dietitian about what dietary choices are best for you. Grains Baked goods made with fat, such as croissants, muffins, or some breads. Dry pasta or rice meal packs. Vegetables Creamed or fried vegetables. Vegetables in a cheese sauce. Regular canned vegetables (not low-sodium or reduced-sodium). Regular canned tomato sauce and paste (not low-sodium or reduced-sodium). Regular tomato and vegetable juice (not low-sodium or reduced-sodium). Rosita Fire. Olives. Fruits Canned fruit in a light  or heavy syrup. Fried fruit. Fruit in cream or butter sauce. Meat and other protein foods Fatty cuts of meat. Ribs. Fried meat. Tomasa BlaseBacon. Sausage. Bologna and other processed lunch meats. Salami. Fatback. Hotdogs. Bratwurst. Salted nuts and seeds. Canned beans with added salt. Canned or  smoked fish. Whole eggs or egg yolks. Chicken or [place] with skin. Dairy Whole or 2% milk, cream, and half-and-half. Whole or full-fat cream cheese. Whole-fat or sweetened yogurt. Full-fat cheese. Nondairy creamers. Whipped toppings. Processed cheese and cheese spreads. Fats and oils Butter. Stick margarine. Lard. Shortening. Ghee. Bacon fat. Tropical oils, such as coconut, palm kernel, or palm oil. Seasoning and other foods Salted popcorn and pretzels. Onion salt, garlic salt, seasoned salt, table salt, and sea salt. Worcestershire sauce. Tartar sauce. Barbecue sauce. Teriyaki sauce. Soy sauce, including reduced-sodium. Steak sauce. Canned and packaged gravies. Fish sauce. Oyster sauce. Cocktail sauce. Horseradish that you find on the shelf. Ketchup. Mustard. Meat flavorings and tenderizers. Bouillon cubes. Hot sauce and Tabasco sauce. Premade or packaged marinades. Premade or packaged taco seasonings. Relishes. Regular salad dressings. Where to find more information:  National Heart, Lung, and Blood Institute: PopSteam.iswww.nhlbi.nih.gov  American Heart Association: www.heart.org Summary  The DASH eating plan is a healthy eating plan that has been shown to reduce high blood pressure (hypertension). It may also reduce your risk for type 2 diabetes, heart disease, and stroke.  With the DASH eating plan, you should limit salt (sodium) intake to 2,300 mg a day. If you have hypertension, you may need to reduce your sodium intake to 1,500 mg a day.  When on the DASH eating plan, aim to eat more fresh fruits and vegetables, whole grains, lean proteins, low-fat dairy, and heart-healthy fats.  Work with your health care provider or diet and nutrition specialist (dietitian) to adjust your eating plan to your individual calorie needs. This information is not intended to replace advice given to you by your health care provider. Make sure you discuss any questions you have with your health care provider. Document  Revised: 05/08/2017 Document Reviewed: 05/19/2016 Elsevier Patient Education  2020 [employer] Psychiatric/Counseling Resources Discussed As Follows:  If Emergency please seek Emergency Room Care Immediately or Call 911.   [facility] Address:  [address] [place], [place] [address] Phone: [phone] Website : [email or web address]   [facility] Address:  [address] [place], [place] [address] Phone: [phone] Fax: [phone] Website: [email or web address] How To Access Our Services Because our main goal is to meet the needs of our consumers, [facility] operates on a walk-in basis! To access services, there are just 3 easy steps: 1) Walk in any Monday, Wednesday or Friday between 8:00 am and 3:00 pm and complete our consumer paperwork 2) A Comprehensive Clinical Assessment (CCA) will be completed and appropriate service recommendations will be provided 3) Recommendations are sent to [facility] team members and the appropriate staff will call you within days. Advanced Access Open M - F, 8:00 am - 8:00 pm  Mental health crisis services for all age groups  Triage  Psychiatric Evaluations  Involuntary Commitments  [facility]  Address: [address] [place] [address] Website : [email or web address] Walk in's accepted see web site or call for more information Phone : [phone] Also has [facility] [phone]    Psychology Today Find a therapist by searching online in your area or specialist by your diagnosis Website:  [email or web address]     With nasal drainage Covid testing  advised.   Covid Testing Instructions for [place]:  If you/your practice has a patient that needs an appointment, please have the patient text "COVID" to [phone], OR they can log on to [email or web address] to easily make an on-line appointment.  Please note:  If you have a patient who does not have access to a smart phone or PC, you or the patient can call [phone] to get assistance.    Health Maintenance, Female Adopting a healthy lifestyle and getting preventive care are important in promoting health and wellness. Ask your health care provider about:  The right schedule for you to have regular tests and exams.  Things you can do on your own to prevent diseases and keep yourself healthy. What should I know about diet, weight, and exercise? Eat a healthy diet   Eat a diet that includes plenty of vegetables, fruits, low-fat dairy products, and lean protein.  Do not eat a lot of foods that are high in solid fats, added sugars, or sodium. Maintain a healthy weight Body mass index (BMI) is used to identify weight problems. It estimates body fat based on height and weight. Your health care provider can help determine your BMI and help you achieve or maintain a healthy weight. Get regular exercise Get regular exercise. This is one of the most important things you can do for your health. Most adults should:  Exercise for at least 150 minutes each week. The exercise should increase your heart rate and make you sweat (moderate-intensity exercise).  Do strengthening exercises at least twice a week. This is in addition to the moderate-intensity exercise.  Spend less time sitting. Even light physical activity can be beneficial. Watch cholesterol and blood lipids Have your blood tested for lipids and cholesterol at 31 years of age, then have this test every 5 years. Have your cholesterol levels checked more often if:  Your lipid or cholesterol levels are high.  You are older than 31 years of age.  You are at high risk for heart disease. What should I know about cancer screening? Depending on your health history and family history, you may need to have cancer screening at various ages. This may include screening for:  Breast  cancer.  Cervical cancer.  Colorectal cancer.  Skin cancer.  Lung cancer. What should I know about heart disease, diabetes, and high blood pressure? Blood pressure and heart disease  High blood pressure causes heart disease and increases the risk of stroke. This is more likely to develop in people who have high blood pressure readings, are of African descent, or are overweight.  Have your blood pressure checked: ? Every 3-5 years if you are 38-61 years of age. ? Every year if you are 25 years old or older. Diabetes Have regular diabetes screenings. This checks your fasting blood sugar level. Have the screening done:  Once every three years after age 72 if you are at a normal weight and have a low risk for diabetes.  More often and at a younger age if you are overweight or have a high risk for diabetes. What should I know about preventing infection? Hepatitis B If you have a higher risk for hepatitis B, you should be screened for this virus. Talk with your health care provider to find out if you are at risk for hepatitis B infection. Hepatitis C Testing is recommended for:  Everyone born from [birth year] through 1967.  Anyone with known risk factors for hepatitis C. Sexually transmitted infections (STIs)  Get screened for STIs, including gonorrhea and chlamydia, if: ? You are sexually active and are younger than 31 years of age. ? You are older than 31 years of age and your health care provider tells you that you are at risk for this type of infection. ? Your sexual activity has changed since you were last screened, and you are at increased risk for chlamydia or gonorrhea. Ask your health care provider if you are at risk.  Ask your health care provider about whether you are at high risk for HIV. Your health care provider may recommend a prescription medicine to help prevent HIV infection. If you choose to take medicine to prevent HIV, you should first get tested for HIV. You should  then be tested every 3 months for as long as you are taking the medicine. Pregnancy  If you are about to stop having your period (premenopausal) and you may become pregnant, seek counseling before you get pregnant.  Take 400 to 800 micrograms (mcg) of folic acid every day if you become pregnant.  Ask for birth control (contraception) if you want to prevent pregnancy. Osteoporosis and menopause Osteoporosis is a disease in which the bones lose minerals and strength with aging. This can result in bone fractures. If you are 27 years old or older, or if you are at risk for osteoporosis and fractures, ask your health care provider if you should:  Be screened for bone loss.  Take a calcium or vitamin D supplement to lower your risk of fractures.  Be given hormone replacement therapy (HRT) to treat symptoms of menopause. Follow these instructions at home: Lifestyle  Do not use any products that contain nicotine or tobacco, such as cigarettes, e-cigarettes, and chewing tobacco. If you need help quitting, ask your health care provider.  Do not use street drugs.  Do not share needles.  Ask your health care provider for help if you need support or information about quitting drugs. Alcohol use  Do not drink alcohol if: ? Your health care provider tells you not to drink. ? You are pregnant, may be pregnant, or are planning to become pregnant.  If you drink alcohol: ? Limit how much you use to 0-1 drink a day. ? Limit intake if you are breastfeeding.  Be aware of how much alcohol is in your drink. In the [place] one drink equals one 12 oz bottle of beer (355 mL), one 5 oz glass of wine (148 mL), or one 1 oz glass of hard liquor (44 mL). General instructions  Schedule regular health, dental, and eye exams.  Stay current with your vaccines.  Tell your health care provider if: ? You often feel depressed. ? You have ever been abused or do not feel safe at home. Summary  Adopting a  healthy lifestyle and getting preventive care are important in promoting health and wellness.  Follow your health care provider's instructions about healthy diet, exercising, and getting tested or screened for diseases.  Follow your health care provider's instructions on monitoring your cholesterol and blood pressure. This information is not intended to replace advice given to you by your health care provider. Make sure you discuss any questions you have with your health care provider. Document Revised: 05/19/2018 Document Reviewed: 05/19/2018 Elsevier Patient Education  2020 [employer].  Otitis Media, Adult  Otitis media means that the middle ear is red and swollen (inflamed) and full of fluid. The condition usually goes away on its own. Follow these instructions at home:  Take over-the-counter and prescription medicines only as told by your doctor.  If you were prescribed an antibiotic medicine, take it as told by your doctor. Do not stop taking the antibiotic even if you start to feel better.  Keep all follow-up visits as told by your doctor. This is important. Contact a doctor if:  You have bleeding from your nose.  There is a lump on your neck.  You are not getting better in 5 days.  You feel worse instead of better. Get help right away if:  You have pain that is not helped with medicine.  You have swelling, redness, or pain around your ear.  You get a stiff neck.  You cannot move part of your face (paralyzed).  You notice that the bone behind your ear hurts when you touch it.  You get a very bad headache. Summary  Otitis media means that the middle ear is red, swollen, and full of fluid.  This condition usually goes away on its own. In some cases, treatment may be needed.  If you were prescribed an antibiotic medicine, take it as told by your doctor. This information is not intended to replace advice given to you by your health care provider. Make sure you  discuss any questions you have with your health care provider. Document Revised: 05/08/2017 Document Reviewed: 06/16/2016 Elsevier Patient Education  2020 [employer] Inc[employer]  Sinusitis, Adult Sinusitis is soreness and swelling (inflammation) of your sinuses. Sinuses are hollow spaces in the bones around your face. They are located:  Around your eyes.  In the middle of your forehead.  Behind your nose.  In your cheekbones. Your sinuses and nasal passages are lined with a fluid called mucus. Mucus drains out of your sinuses. Swelling can trap mucus in your sinuses. This lets germs (bacteria, virus, or fungus) grow, which leads to infection. Most of the time, this condition is caused by a virus. What are the causes? This condition is caused by:  Allergies.  Asthma.  Germs.  Things that block your nose or sinuses.  Growths in the nose (nasal polyps).  Chemicals or irritants in the air.  Fungus (rare). What increases the risk? You are more likely to develop this condition if:  You have a weak body defense system (immune system).  You do a lot of swimming or diving.  You use nasal sprays too much.  You smoke. What are the signs or symptoms? The main symptoms of this condition are pain and a feeling of pressure around the sinuses. Other symptoms include:  Stuffy nose (congestion).  Runny nose (drainage).  Swelling and warmth in the sinuses.  Headache.  Toothache.  A cough that may get worse at night.  Mucus that collects in the throat or the back of the nose (postnasal drip).  Being unable to smell and taste.  Being very tired (fatigue).  A fever.  Sore throat.  Bad breath. How is this diagnosed? This condition is diagnosed based on:  Your symptoms.  Your medical history.  A physical exam.  Tests to find out if your condition is short-term (acute) or long-term (chronic). Your doctor may: ? Check your nose for growths (polyps). ? Check your sinuses  using a tool that has a light (endoscope). ? Check for allergies or germs. ? Do imaging tests, such as an MRI or CT scan. How is this treated? Treatment for this condition depends on the cause and whether it is short-term or long-term.  If caused by a  virus, your symptoms should go away on their own within 10 days. You may be given medicines to relieve symptoms. They include: ? Medicines that shrink swollen tissue in the nose. ? Medicines that treat allergies (antihistamines). ? A spray that treats swelling of the nostrils. ? Rinses that help get rid of thick mucus in your nose (nasal saline washes).  If caused by bacteria, your doctor may wait to see if you will get better without treatment. You may be given antibiotic medicine if you have: ? A very bad infection. ? A weak body defense system.  If caused by growths in the nose, you may need to have surgery. Follow these instructions at home: Medicines  Take, use, or apply over-the-counter and prescription medicines only as told by your doctor. These may include nasal sprays.  If you were prescribed an antibiotic medicine, take it as told by your doctor. Do not stop taking the antibiotic even if you start to feel better. Hydrate and humidify   Drink enough water to keep your pee (urine) pale yellow.  Use a cool mist humidifier to keep the humidity level in your home above 50%.  Breathe in steam for 10-15 minutes, 3-4 times a day, or as told by your doctor. You can do this in the bathroom while a hot shower is running.  Try not to spend time in cool or dry air. Rest  Rest as much as you can.  Sleep with your head raised (elevated).  Make sure you get enough sleep each night. General instructions   Put a warm, moist washcloth on your face 3-4 times a day, or as often as told by your doctor. This will help with discomfort.  Wash your hands often with soap and water. If there is no soap and water, use hand sanitizer.  Do  not smoke. Avoid being around people who are smoking (secondhand smoke).  Keep all follow-up visits as told by your doctor. This is important. Contact a doctor if:  You have a fever.  Your symptoms get worse.  Your symptoms do not get better within 10 days. Get help right away if:  You have a very bad headache.  You cannot stop throwing up (vomiting).  You have very bad pain or swelling around your face or eyes.  You have trouble seeing.  You feel confused.  Your neck is stiff.  You have trouble breathing. Summary  Sinusitis is swelling of your sinuses. Sinuses are hollow spaces in the bones around your face.  This condition is caused by tissues in your nose that become inflamed or swollen. This traps germs. These can lead to infection.  If you were prescribed an antibiotic medicine, take it as told by your doctor. Do not stop taking it even if you start to feel better.  Keep all follow-up visits as told by your doctor. This is important. This information is not intended to replace advice given to you by your health care provider. Make sure you discuss any questions you have with your health care provider. Document Revised: 10/26/2017 Document Reviewed: 10/26/2017 Elsevier Patient Education  2020 [employer].

## 2019-09-13 NOTE — Progress Notes (Addendum)
New patient visit      Patient: April Davenport   DOB: Jul 04, 1990   29 y.o. Female  MRN: [id number] Visit Date: 09/13/2019  Today's healthcare provider: Marcille Buffy, FNP  Subjective:    Chief Complaint  Patient presents with  . New Patient (Initial Visit)   Allergies  Allergen Reactions  . Dilaudid [Hydromorphone Hcl] Hives, Shortness Of Breath and Itching    Syncope  . Black [employer] and Swelling  . Coconut Flavor Swelling    April Davenport is a 29 y.o. female who presents today as a new patient to establish care.  HPI  Patient would like to be reevaluated for HTN because she has a history of hypertension with her pregnancies. She deliver C- section at 38 weeks, January 2021. Healthy baby. She had a tubal ligation after c- section. She has a history of pre- eclampsia wit h her first child and reports was starting to have some changes in liver enzymes and blood pressy  Patient's last menstrual period was 08/15/2019.  Denies any injury or trauma.  Sees gynecology has had follow up for c- section.  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 Her boss gave her a reasonable accomodation form she would like to have filled out.   She has had sinus pressure and congestion over 10 days. Pressure in forehead.   She is not on any blood pressure medications.  She had pre- eclampsia with her first pregnancy.  [id number]  She reports she took her blood pressure last night because she had a headache was 166/117 at home and she took her Amlodipine 10 mg by mouth once  - she is unsure of the other pill she took last night. She was stressed and her right hand was hurting.    Headache is improved today, reports as slight.  She reports she has been off blood pressure medication since last month.   She is having nasal allergy symptoms, she took an allergy pill on Saturday.  Denies any loss of  smell or taste. Denies any covid exposure. Nasal congestion, no cough, ear pressure.  She is breastfeeding.   Patient  denies any fever, body aches,chills, rash, chest pain, shortness of breath, nausea, vomiting, or diarrhea. Denies any edema or leg pain.   History of pre-diabetes.   PAP smear was before last pregnancy. Seen as 12/2017 and was normal per lab report- repeat 2022. 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 No outbreaks or current lesions. She denies any ever.   She sees psychiatry for her depression/ anxiety. She wants to continue seeing the person she is seeing through her work. .    Patient's last menstrual period was 08/15/2019.   Past Medical History:  Diagnosis Date  . Hypertension   . Right nephrolithiasis    Past Surgical History:  Procedure Laterality Date  . CESAREAN SECTION  05/16/2013  . CESAREAN SECTION WITH BILATERAL TUBAL LIGATION Bilateral 06/30/2019   Procedure: CESAREAN SECTION WITH BILATERAL TUBAL LIGATION;  Surgeon: Osborne Oman, MD;  Location: [facility];  Service: Obstetrics;  Laterality: Bilateral;  . KNEE SURGERY Right    Family Status  Relation Name Status  . Mother  Alive  . Father  Deceased  . MGM  Deceased  . MGF  Deceased  . PGM  Alive  .  PGF  Deceased   Family History  Problem Relation Age of Onset  . Diabetes Mother   . Heart attack Father   . Hypertension Father   . Diabetes Paternal Grandfather    Social History   Socioeconomic History  . Marital status: Single    Spouse name: Not on file  . Number of children: Not on file  . Years of education: Not on file  . Highest education level: Not on file  Occupational History  . Not on file  Tobacco Use  . Smoking status: Never Smoker  . Smokeless tobacco: Never Used  Substance and Sexual Activity  . Alcohol use: Not Currently    Comment: rare  . Drug use: Never  . Sexual activity: Yes    Birth control/protection: None  Other  Topics Concern  . Not on file  Social History Narrative  . Not on file   Social Determinants of Health   Financial Resource Strain:   . Difficulty of Paying Living Expenses:   Food Insecurity:   . Worried About Programme researcher, broadcasting/film/video in the Last Year:   . Barista in the Last Year:   Transportation Needs:   . Freight forwarder (Medical):   Marland Kitchen Lack of Transportation (Non-Medical):   Physical Activity:   . Days of Exercise per Week:   . Minutes of Exercise per Session:   Stress:   . Feeling of Stress :   Social Connections:   . Frequency of Communication with Friends and Family:   . Frequency of Social Gatherings with Friends and Family:   . Attends Religious Services:   . Active Member of Clubs or Organizations:   . Attends Banker Meetings:   Marland Kitchen Marital Status:    Outpatient Medications Prior to Visit  Medication Sig  . acetaminophen (TYLENOL) 325 MG tablet Take 650 mg by mouth every 6 (six) hours as needed.  Marland Kitchen acetaminophen (TYLENOL) 500 MG tablet Take 2 tablets (1,000 mg total) by mouth every 6 (six) hours as needed. (Patient not taking: Reported on 09/13/2019)  . amLODipine (NORVASC) 10 MG tablet Take 1 tablet (10 mg total) by mouth daily. (Patient not taking: Reported on 09/13/2019)  . cyclobenzaprine (FLEXERIL) 10 MG tablet Take 1 tablet (10 mg total) by mouth 3 (three) times daily as needed (headache). (Patient not taking: Reported on 09/13/2019)  . ibuprofen (ADVIL) 800 MG tablet Take 1 tablet (800 mg total) by mouth every 6 (six) hours as needed. (Patient not taking: Reported on 09/13/2019)  . lisinopril (PRINIVIL) 10 MG tablet Take 1 tablet (10 mg total) by mouth daily. (Patient not taking: Reported on 09/13/2019)  . Prenatal Vit-Fe Fumarate-FA (PRENATAL MULTIVITAMIN) TABS tablet Take 1 tablet by mouth daily at 12 noon.  . senna-docusate (SENOKOT-S) 8.6-50 MG tablet Take 2 tablets by mouth 2 (two) times daily. (Patient not taking: Reported on 09/13/2019)  .  tranexamic acid (LYSTEDA) 650 MG TABS tablet Take 2 tablets (1,300 mg total) by mouth 3 (three) times daily. Take during menses for a maximum of five days (Patient not taking: Reported on 09/13/2019)   No facility-administered medications prior to visit.   Allergies  Allergen Reactions  . Dilaudid [Hydromorphone Hcl] Hives, Shortness Of Breath and Itching    Syncope  . Black [employer] and Swelling  . Coconut Flavor Swelling    Patient Care Team: Rickita Forstner, Eula Fried, FNP as PCP - General (Family Medicine)  Review of Systems  Constitutional: Positive for  activity change, appetite change, fatigue and unexpected weight change. Negative for chills, diaphoresis and fever.       Crying and irritability  HENT: Positive for congestion, ear pain, postnasal drip, rhinorrhea and sinus pressure. Negative for dental problem, drooling, ear discharge, facial swelling and sinus pain.   Eyes: Positive for itching. Negative for photophobia, pain, discharge, redness and visual disturbance.  Respiratory: Negative.   Cardiovascular: Negative.   Gastrointestinal: Negative for abdominal distention, abdominal pain, anal bleeding, blood in stool, constipation, diarrhea, nausea, rectal pain and vomiting.  Endocrine: Negative.   Genitourinary: Negative.   Musculoskeletal: Negative.   Skin: Negative.   Allergic/Immunologic: Positive for environmental allergies and food allergies.  Neurological: Positive for headaches (frontal ). Negative for dizziness, tremors, seizures, syncope, facial asymmetry, speech difficulty, weakness, light-headedness and numbness.  Hematological: Negative.   Psychiatric/Behavioral: Positive for sleep disturbance (wants to keep seeing her counselor ). Negative for agitation, behavioral problems, confusion, decreased concentration, dysphoric mood, hallucinations, self-injury and suicidal ideas. The patient is nervous/anxious. The patient is not hyperactive.       Objective:      BP 117/82 (BP Location: Left Arm, Patient Position: Sitting, Cuff Size: Large)   Pulse 92   Temp (!) 96.8 F (36 C) (Temporal)   Ht 5\' 1"  (1.549 m)   Wt 197 lb 12.8 oz (89.7 kg)   LMP 08/15/2019   BMI 37.37 kg/m  Physical Exam Vitals reviewed.  Constitutional:      General: She is not in acute distress.    Appearance: She is well-developed. She is not diaphoretic.     Interventions: She is not intubated. HENT:     Head: Normocephalic and atraumatic.     Jaw: There is normal jaw occlusion.      Comments:  Cobblestoning posterior pharynx; bilateral allergic shiners; bilateral TMs air fluid level clear; bilateral nasal turbinates mild edema erythema clear discharge;   Frontal sinus pressure      Right Ear: Hearing and external ear normal. No drainage or swelling. No middle ear effusion. Tympanic membrane is not perforated or erythematous.     Left Ear: Hearing and external ear normal. No drainage or swelling. A middle ear effusion is present. Tympanic membrane is erythematous. Tympanic membrane is not perforated or bulging.     Nose: Congestion and rhinorrhea present.     Right Turbinates: Pale.     Left Turbinates: Pale.     Mouth/Throat:     Lips: Pink.     Mouth: Mucous membranes are moist.     Tongue: No lesions. Tongue does not deviate from midline.     Palate: No lesions.     Pharynx: Oropharynx is clear. Uvula midline. No oropharyngeal exudate.     Tonsils: No tonsillar exudate or tonsillar abscesses. 1+ on the right. 1+ on the left.  Eyes:     General: Lids are normal. No scleral icterus.       Right eye: No discharge.        Left eye: No discharge.     Conjunctiva/sclera: Conjunctivae normal.     Right eye: Right conjunctiva is not injected. No exudate or hemorrhage.    Left eye: Left conjunctiva is not injected. No exudate or hemorrhage.    Pupils: Pupils are equal, round, and reactive to light.  Neck:     Thyroid: No thyroid mass or thyromegaly.     Vascular:  Normal carotid pulses. No carotid bruit, hepatojugular reflux or JVD.     Trachea:  Trachea and phonation normal. No tracheal tenderness or tracheal deviation.     Meningeal: Brudzinski's sign and Kernig's sign absent.  Cardiovascular:     Rate and Rhythm: Normal rate and regular rhythm.     Pulses: Normal pulses.          Radial pulses are 2+ on the right side and 2+ on the left side.       Dorsalis pedis pulses are 2+ on the right side and 2+ on the left side.       Posterior tibial pulses are 2+ on the right side and 2+ on the left side.     Heart sounds: Normal heart sounds, S1 normal and S2 normal. Heart sounds not distant. No murmur. No friction rub. No gallop.   Pulmonary:     Effort: Pulmonary effort is normal. No tachypnea, bradypnea, accessory muscle usage or respiratory distress. She is not intubated.     Breath sounds: Normal breath sounds. No stridor. No wheezing or rales.  Chest:     Chest wall: No tenderness.  Abdominal:     General: Bowel sounds are normal. There is no distension or abdominal bruit.     Palpations: Abdomen is soft. There is no shifting dullness, fluid wave, hepatomegaly, splenomegaly, mass or pulsatile mass.     Tenderness: There is no abdominal tenderness. There is no guarding or rebound.     Hernia: No hernia is present.  Genitourinary:    Comments: Declined. Sees Obstetrics and gynecology  Musculoskeletal:        General: No tenderness or deformity. Normal range of motion.     Cervical back: Full passive range of motion without pain, normal range of motion and neck supple. No edema, erythema or rigidity. No spinous process tenderness or muscular tenderness. Normal range of motion.  Lymphadenopathy:     Head:     Right side of head: No submental, submandibular, tonsillar, preauricular, posterior auricular or occipital adenopathy.     Left side of head: No submental, submandibular, tonsillar, preauricular, posterior auricular or occipital adenopathy.      Cervical: No cervical adenopathy.     Right cervical: No superficial, deep or posterior cervical adenopathy.    Left cervical: No superficial, deep or posterior cervical adenopathy.     Upper Body:     Right upper body: No supraclavicular or pectoral adenopathy.     Left upper body: No supraclavicular or pectoral adenopathy.  Skin:    General: Skin is warm and dry.     Coloration: Skin is not pale.     Findings: No abrasion, bruising, burn, ecchymosis, erythema, lesion, petechiae or rash.     Nails: There is no clubbing.  Neurological:     Mental Status: She is alert and oriented to person, place, and time.     GCS: GCS eye subscore is 4. GCS verbal subscore is 5. GCS motor subscore is 6.     Cranial Nerves: No cranial nerve deficit.     Sensory: No sensory deficit.     Motor: No tremor, atrophy, abnormal muscle tone or seizure activity.     Coordination: Coordination normal.     Gait: Gait normal.     Deep Tendon Reflexes: Reflexes are normal and symmetric. Reflexes normal. Babinski sign absent on the right side. Babinski sign absent on the left side.     Reflex Scores:      Tricep reflexes are 2+ on the right side and 2+ on the left side.  Bicep reflexes are 2+ on the right side and 2+ on the left side.      Brachioradialis reflexes are 2+ on the right side and 2+ on the left side.      Patellar reflexes are 2+ on the right side and 2+ on the left side.      Achilles reflexes are 2+ on the right side and 2+ on the left side. Psychiatric:        Speech: Speech normal.        Behavior: Behavior normal.        Thought Content: Thought content normal.        Judgment: Judgment normal.     Depression screen [facility] 2/9 09/13/2019 03/18/2018  Decreased Interest 2 0  Down, Depressed, Hopeless 2 0  PHQ - 2 Score 4 0  Altered sleeping 2 -  Tired, decreased energy 3 -  Change in appetite 3 -  Feeling bad or failure about yourself  2 -  Trouble concentrating 2 -  Moving slowly or  fidgety/restless 0 -  Suicidal thoughts 0 -  PHQ-9 Score 16 -  Difficult doing work/chores Very difficult -    GAD 7 : Generalized Anxiety Score 09/13/2019  Nervous, Anxious, on Edge 3  Control/stop worrying 2  Worry too much - different things 2  Trouble relaxing 1  Restless 0  Easily annoyed or irritable 1  Afraid - awful might happen 2  Total GAD 7 Score 11  Anxiety Difficulty Somewhat difficult       No results found for any visits on 09/13/19.    Assessment & Plan:   Body mass index (BMI) of 37.0-37.9 in adult - Plan: CBC with Differential/Platelet, Comprehensive Metabolic Panel (CMET)  Lactating mother - Plan: CBC with Differential/Platelet, Comprehensive Metabolic Panel (CMET)  Hypertension, unspecified type - Plan: CBC with Differential/Platelet, Comprehensive Metabolic Panel (CMET), POCT urinalysis dipstick  Vitamin D deficiency - Plan: VITAMIN D 25 Hydroxy (Vit-D Deficiency, Fractures)  Stress headaches - Plan: CBC with Differential/Platelet, Comprehensive Metabolic Panel (CMET)  Prediabetes - Plan: CBC with Differential/Platelet, Comprehensive Metabolic Panel (CMET), HgB A1c, POCT urinalysis dipstick  History of pre-eclampsia - Plan: POCT urinalysis dipstick  History of hyperthyroidism - Plan: Thyroid Panel With TSH  History of hyperlipidemia - Plan: Lipid Panel w/o Chol/HDL Ratio  Anemia, unspecified type - Plan: Iron and TIBC  Seasonal allergies  Acute non-recurrent frontal sinusitis  Acute serous otitis media of left ear, recurrence not specified    Orders Placed This Encounter  Procedures  . CBC with Differential/Platelet  . Comprehensive Metabolic Panel (CMET)  . Thyroid Panel With TSH  . Lipid Panel w/o Chol/HDL Ratio  . Iron and TIBC  . VITAMIN D 25 Hydroxy (Vit-D Deficiency, Fractures)  . HgB A1c  . POCT urinalysis dipstick   She will monitor blood pressures without medication and report readings if any persistent hypertension or  hypotension she will report prior to next visit.   RED flags and when to seek care immediately was dicussed.  Recommend referral to psychology, she declines at this time, does not want medication while lactation and wants to continue seeing her counselor. She will let me know should she change her mind on this or symptoms worsen, persist or change.  She will keep follow up's with her gynecologist as well.   Advised patient call the office or your primary care doctor for an appointment if no improvement within 72 hours or if any symptoms change or worsen at any  time  Advised ER or urgent Care if after hours or on weekend. Call 911 for emergency symptoms at any time.Patinet verbalized understanding of all instructions given/reviewed and treatment plan and has no further questions or concerns at this time.    Return in about 13 days (around 09/26/2019), or if symptoms worsen or fail to improve, for at any time for any worsening symptoms, Go to Emergency room/ urgent care if worse.   The entirety of the information documented in the History of Present Illness, Review of Systems and Physical Exam were personally obtained by me. Portions of this information were initially documented by the  Certified Medical Assistant whose name is documented in Epic and reviewed by me for thoroughness and accuracy.  I have personally performed the exam and reviewed the chart and it is accurate to the best of my knowledge.  Museum/gallery conservator has been used and any errors in dictation or transcription are unintentional.  Eula Fried. Tynetta Bachmann FNP-C  [facility]   Jairo Ben, FNP  [facility] [phone] (phone) [phone] (fax)  [facility]

## 2019-09-14 DIAGNOSIS — R7303 Prediabetes: Secondary | ICD-10-CM | POA: Diagnosis not present

## 2019-09-14 DIAGNOSIS — Z8639 Personal history of other endocrine, nutritional and metabolic disease: Secondary | ICD-10-CM | POA: Diagnosis not present

## 2019-09-14 DIAGNOSIS — I10 Essential (primary) hypertension: Secondary | ICD-10-CM | POA: Diagnosis not present

## 2019-09-14 DIAGNOSIS — Z6837 Body mass index (BMI) 37.0-37.9, adult: Secondary | ICD-10-CM | POA: Diagnosis not present

## 2019-09-14 DIAGNOSIS — F4541 Pain disorder exclusively related to psychological factors: Secondary | ICD-10-CM | POA: Diagnosis not present

## 2019-09-14 DIAGNOSIS — D649 Anemia, unspecified: Secondary | ICD-10-CM | POA: Diagnosis not present

## 2019-09-14 DIAGNOSIS — E559 Vitamin D deficiency, unspecified: Secondary | ICD-10-CM | POA: Diagnosis not present

## 2019-09-15 LAB — COMPREHENSIVE METABOLIC PANEL
ALT: 11 IU/L (ref 0–32)
AST: 19 IU/L (ref 0–40)
Albumin/Globulin Ratio: 1.6 (ref 1.2–2.2)
Albumin: 4.4 g/dL (ref 3.9–5.0)
Alkaline Phosphatase: 122 IU/L — ABNORMAL HIGH (ref 39–117)
BUN/Creatinine Ratio: 16 (ref 9–23)
BUN: 13 mg/dL (ref 6–20)
Bilirubin Total: 0.7 mg/dL (ref 0.0–1.2)
CO2: 23 mmol/L (ref 20–29)
Calcium: 9.9 mg/dL (ref 8.7–10.2)
Chloride: 103 mmol/L (ref 96–106)
Creatinine, Ser: 0.81 mg/dL (ref 0.57–1.00)
GFR calc Af Amer: 113 mL/min/{1.73_m2} (ref >59)
GFR calc non Af Amer: 98 mL/min/{1.73_m2} (ref >59)
Globulin, Total: 2.8 g/dL (ref 1.5–4.5)
Glucose: 85 mg/dL (ref 65–99)
Potassium: 3.9 mmol/L (ref 3.5–5.2)
Sodium: 142 mmol/L (ref 134–144)
Total Protein: 7.2 g/dL (ref 6.0–8.5)

## 2019-09-15 LAB — THYROID PANEL WITH TSH
Free Thyroxine Index: 1.7 (ref 1.2–4.9)
T3 Uptake Ratio: 25 % (ref 24–39)
T4, Total: 6.9 ug/dL (ref 4.5–12.0)
TSH: 0.927 u[IU]/mL (ref 0.450–4.500)

## 2019-09-15 LAB — CBC WITH DIFFERENTIAL/PLATELET
Basophils Absolute: 0.1 10*3/uL (ref 0.0–0.2)
Basos: 1 %
EOS (ABSOLUTE): 0.6 10*3/uL — ABNORMAL HIGH (ref 0.0–0.4)
Eos: 9 %
Hematocrit: 40.1 % (ref 34.0–46.6)
Hemoglobin: 12.7 g/dL (ref 11.1–15.9)
Immature Grans (Abs): 0 10*3/uL (ref 0.0–0.1)
Immature Granulocytes: 0 %
Lymphocytes Absolute: 2.1 10*3/uL (ref 0.7–3.1)
Lymphs: 30 %
MCH: 26.2 pg — ABNORMAL LOW (ref 26.6–33.0)
MCHC: 31.7 g/dL (ref 31.5–35.7)
MCV: 83 fL (ref 79–97)
Monocytes Absolute: 0.4 10*3/uL (ref 0.1–0.9)
Monocytes: 5 %
Neutrophils Absolute: 3.7 10*3/uL (ref 1.4–7.0)
Neutrophils: 55 %
Platelets: 387 10*3/uL (ref 150–450)
RBC: 4.84 x10E6/uL (ref 3.77–5.28)
RDW: 13 % (ref 11.7–15.4)
WBC: 6.9 10*3/uL (ref 3.4–10.8)

## 2019-09-15 LAB — LIPID PANEL W/O CHOL/HDL RATIO
Cholesterol, Total: 210 mg/dL — ABNORMAL HIGH (ref 100–199)
HDL: 68 mg/dL (ref >39)
LDL Chol Calc (NIH): 132 mg/dL — ABNORMAL HIGH (ref 0–99)
Triglycerides: 55 mg/dL (ref 0–149)
VLDL Cholesterol Cal: 10 mg/dL (ref 5–40)

## 2019-09-15 LAB — HEMOGLOBIN A1C
Est. average glucose Bld gHb Est-mCnc: 108 mg/dL
Hgb A1c MFr Bld: 5.4 % (ref 4.8–5.6)

## 2019-09-15 LAB — IRON AND TIBC
Iron Saturation: 11 % — ABNORMAL LOW (ref 15–55)
Iron: 32 ug/dL (ref 27–159)
Total Iron Binding Capacity: 286 ug/dL (ref 250–450)
UIBC: 254 ug/dL (ref 131–425)

## 2019-09-15 LAB — VITAMIN D 25 HYDROXY (VIT D DEFICIENCY, FRACTURES): Vit D, 25-Hydroxy: 14.4 ng/mL — ABNORMAL LOW (ref 30.0–100.0)

## 2019-09-16 ENCOUNTER — Other Ambulatory Visit: Payer: Self-pay | Admitting: Adult Health

## 2019-09-16 NOTE — Progress Notes (Signed)
  CBC within normal limits no anemia, or signs of infection. She is breastfeeding and should continue her prenatal with iron during lactation. Iron level is just slightly low.  CMP shows normal glucose, electrolytes, kidney function. Liver enzyme alkaline phosphatase is mildly elevated may be related to post partum and breastfeeding will recheck hepatic panel and Vitamin D lab in around 4 months. She can take over the counter vitamin D 3 at 1,000 international units by mouth daily. Be sure she checks her prenatal to see how much vitamin D it has in it.  Thyroid within normal limits.   Total cholesterol and LDL elevated. Discuss lifestyle modification with patient e.g. increase exercise, fiber, fruits, vegetables, lean meat, and omega 3/fish intake and decrease saturated fat. If patient following strict diet and exercise program already please schedule follow up appointment with primary care physician

## 2019-09-30 ENCOUNTER — Telehealth: Payer: Self-pay

## 2019-09-30 NOTE — Telephone Encounter (Signed)
Patient was to follow up to discuss this further in office

## 2019-09-30 NOTE — Telephone Encounter (Signed)
Copied from CRM [id number]. Topic: General - Other >> Sep 30, 2019  9:52 AM Herby Abraham C wrote: Reason for CRM: pt called in to follow up on paperwork. Pt says that she gave it to provider last week, pt is aware that provider is out of the office.   Please advise if paperwork has been complete.   CB: [phone]

## 2019-09-30 NOTE — Telephone Encounter (Signed)
Called and spoke with patient and advised her that Marcelino Duster is not in office today. Patient wanted to inquire if Marcelino Duster had finished form for reasonable accomodation? I advised patient that Marcelino Duster will return Monday and we will contact her back with update. KW

## 2019-10-03 ENCOUNTER — Encounter: Payer: Self-pay | Admitting: Family Medicine

## 2019-10-03 ENCOUNTER — Encounter: Payer: Self-pay | Admitting: Adult Health

## 2019-10-03 ENCOUNTER — Ambulatory Visit (INDEPENDENT_AMBULATORY_CARE_PROVIDER_SITE_OTHER): Payer: Federal, State, Local not specified - PPO | Admitting: Family Medicine

## 2019-10-03 ENCOUNTER — Other Ambulatory Visit: Payer: Self-pay

## 2019-10-03 ENCOUNTER — Ambulatory Visit: Payer: Self-pay

## 2019-10-03 VITALS — BP 123/88 | HR 78 | Temp 97.6°F | Resp 18

## 2019-10-03 DIAGNOSIS — S0083XA Contusion of other part of head, initial encounter: Secondary | ICD-10-CM

## 2019-10-03 NOTE — Progress Notes (Signed)
Established patient visit   Patient: April Davenport   DOB: 1991/06/09   29 y.o. Female  MRN: [id number] Visit Date: 10/03/2019  Today's healthcare provider: Lelon Huh, MD   Chief Complaint  Patient presents with  . Head Injury   Subjective    Head Injury  The injury mechanism was a direct blow (hit left side of head on the car door). There was no loss of consciousness. The quality of the pain is described as sharp. The pain has been improving since the injury. Associated symptoms include blurred vision, headaches and weakness. Pertinent negatives include no disorientation, memory loss, numbness, tinnitus or vomiting. She has tried nothing for the symptoms.  Is now concerned about swelling around her eyelid, but all other associated symptoms have resolved since it happened this morning.     Medications: Outpatient Medications Prior to Visit  Medication Sig  . fluticasone (FLONASE ALLERGY RELIEF) 50 MCG/ACT nasal spray Place 2 sprays into both nostrils daily. (Patient not taking: Reported on 10/03/2019)  . [DISCONTINUED] acetaminophen (TYLENOL) 325 MG tablet Take 650 mg by mouth every 6 (six) hours as needed.  . [DISCONTINUED] amoxicillin (AMOXIL) 875 MG tablet Take 1 tablet (875 mg total) by mouth 2 (two) times daily. (Patient not taking: Reported on 10/03/2019)  . [DISCONTINUED] loratadine (CLARITIN) 10 MG tablet Take 1 tablet (10 mg total) by mouth daily. Short term use ok while breastfeeding (Patient not taking: Reported on 10/03/2019)  . [DISCONTINUED] Prenatal Vit-Fe Fumarate-FA (PRENATAL MULTIVITAMIN) TABS tablet Take 1 tablet by mouth daily at 12 noon.  . [DISCONTINUED] senna-docusate (SENOKOT-S) 8.6-50 MG tablet Take 2 tablets by mouth 2 (two) times daily. (Patient not taking: Reported on 09/13/2019)   No facility-administered medications prior to visit.    Review of Systems  Constitutional: Negative for appetite change, chills, fatigue and fever.  HENT: Negative for  tinnitus.   Eyes: Positive for blurred vision.  Respiratory: Negative for chest tightness and shortness of breath.   Cardiovascular: Negative for chest pain and palpitations.  Gastrointestinal: Negative for abdominal pain, nausea and vomiting.  Neurological: Positive for weakness and headaches. Negative for dizziness and numbness.  Psychiatric/Behavioral: Negative for memory loss.      Objective    BP 123/88 (BP Location: Left Arm, Cuff Size: Large)   Pulse 78   Temp 97.6 F (36.4 C) (Temporal)   Resp 18   SpO2 99% Comment: room air   Physical Exam   General: Appearance:    Overweight female in no acute distress  HEENT:   Contusion of left forehead noted with small amount of swelling of left upper eyelid.   Eyes:    PERRL, conjunctiva/corneas clear, EOM's intact       Lungs:     Clear to auscultation bilaterally, respirations unlabored  Heart:    . Normal rhythm. No murmurs, rubs, or gallops.   MS:   All extremities are intact.   Neurologic:   Awake, alert, oriented x 3. No apparent focal neurological           defect.         Assessment & Plan    1. Contusion of forehead, initial encounter No LOC or other indication so neurological or intracranial injuries. Counseled regarding regular application of ice and to expect bruising to migrate downward from site of original injury. Advised to expect minor headaches for a few days or weeks, but to call if any severe or other neurological sx develop.   No follow-ups  on file.      The entirety of the information documented in the History of Present Illness, Review of Systems and Physical Exam were personally obtained by me. Portions of this information were initially documented by the CMA and reviewed by me for thoroughness and accuracy.      Mila Merry, MD  [facility] [phone] (phone) [phone] (fax)  [facility]

## 2019-10-03 NOTE — Telephone Encounter (Signed)
Pt. Hit her forehead this morning at 0830 on her car door. Hit left side of forehead above her eye.No pain, no loss of consciousness.No laceration. Has swelling quarter to silver dollar size. Requests to "be checked out." No availability with her PCP. Appointment made for this afternoon. Instructed if she has worsening of symptoms to call back.  Reason for Disposition . [1] After 72 hours AND [2] headache persists  Answer Assessment - Initial Assessment Questions 1. MECHANISM: "How did the injury happen?" For falls, ask: "What height did you fall from?" and "What surface did you fall against?"      Hit head on car door 2. ONSET: "When did the injury happen?" (Minutes or hours ago)      0830 this morning 3. NEUROLOGIC SYMPTOMS: "Was there any loss of consciousness?" "Are there any other neurological symptoms?"      No 4. MENTAL STATUS: "Does the person know who he is, who you are, and where he is?"      Alert and oriented 5. LOCATION: "What part of the head was hit?"      Above left eye at eyebrow 6. SCALP APPEARANCE: "What does the scalp look like? Is it bleeding now?" If so, ask: "Is it difficult to stop?"      No cut 7. SIZE: For cuts, bruises, or swelling, ask: "How large is it?" (e.g., inches or centimeters)      Quarter size 8. PAIN: "Is there any pain?" If so, ask: "How bad is it?"  (e.g., Scale 1-10; or mild, moderate, severe)     No 9. TETANUS: For any breaks in the skin, ask: "When was the last tetanus booster?"     Unsure 10. OTHER SYMPTOMS: "Do you have any other symptoms?" (e.g., neck pain, vomiting)       No 11. PREGNANCY: "Is there any chance you are pregnant?" "When was your last menstrual period?"       No  Protocols used: HEAD INJURY-A-AH

## 2019-10-04 NOTE — Telephone Encounter (Signed)
Spoke with patient who states that she has a follow up scheduled this Thursday 10/06/2019. KW

## 2019-10-06 ENCOUNTER — Ambulatory Visit (INDEPENDENT_AMBULATORY_CARE_PROVIDER_SITE_OTHER): Payer: Federal, State, Local not specified - PPO | Admitting: Adult Health

## 2019-10-06 ENCOUNTER — Encounter: Payer: Self-pay | Admitting: Adult Health

## 2019-10-06 ENCOUNTER — Other Ambulatory Visit: Payer: Self-pay

## 2019-10-06 VITALS — BP 110/78 | HR 81 | Temp 96.6°F | Resp 16 | Wt 195.4 lb

## 2019-10-06 DIAGNOSIS — Z6836 Body mass index (BMI) 36.0-36.9, adult: Secondary | ICD-10-CM | POA: Diagnosis not present

## 2019-10-06 DIAGNOSIS — E559 Vitamin D deficiency, unspecified: Secondary | ICD-10-CM | POA: Diagnosis not present

## 2019-10-06 DIAGNOSIS — O99345 Other mental disorders complicating the puerperium: Secondary | ICD-10-CM | POA: Diagnosis not present

## 2019-10-06 DIAGNOSIS — F411 Generalized anxiety disorder: Secondary | ICD-10-CM

## 2019-10-06 DIAGNOSIS — F4010 Social phobia, unspecified: Secondary | ICD-10-CM

## 2019-10-06 DIAGNOSIS — F53 Postpartum depression: Secondary | ICD-10-CM

## 2019-10-06 MED ORDER — VITAMIN D3 25 MCG (1000 UNIT) PO TABS: 1000.0000 [IU] | ORAL_TABLET | Freq: Every day | ORAL | 0 refills | Status: DC

## 2019-10-06 MED ORDER — MULTIVITAMINS PO CAPS: 1.0000 | ORAL_CAPSULE | Freq: Every day | ORAL | 0 refills | Status: DC

## 2019-10-06 NOTE — Patient Instructions (Addendum)
Generalized Anxiety Disorder, Adult Generalized anxiety disorder (GAD) is a mental health disorder. People with this condition constantly worry about everyday events. Unlike normal anxiety, worry related to GAD is not triggered by a specific event. These worries also do not fade or get better with time. GAD interferes with life functions, including relationships, work, and school. GAD can vary from mild to severe. People with severe GAD can have intense waves of anxiety with physical symptoms (panic attacks). What are the causes? The exact cause of GAD is not known. What increases the risk? This condition is more likely to develop in:  Women.  People who have a family history of anxiety disorders.  People who are very shy.  People who experience very stressful life events, such as the death of a loved one.  People who have a very stressful family environment. What are the signs or symptoms? People with GAD often worry excessively about many things in their lives, such as their health and family. They may also be overly concerned about:  Doing well at work.  Being on time.  Natural disasters.  Friendships. Physical symptoms of GAD include:  Fatigue.  Muscle tension or having muscle twitches.  Trembling or feeling shaky.  Being easily startled.  Feeling like your heart is pounding or racing.  Feeling out of breath or like you cannot take a deep breath.  Having trouble falling asleep or staying asleep.  Sweating.  Nausea, diarrhea, or irritable bowel syndrome (IBS).  Headaches.  Trouble concentrating or remembering facts.  Restlessness.  Irritability. How is this diagnosed? Your health care provider can diagnose GAD based on your symptoms and medical history. You will also have a physical exam. The health care provider will ask specific questions about your symptoms, including how severe they are, when they started, and if they come and go. Your health care  provider may ask you about your use of alcohol or drugs, including prescription medicines. Your health care provider may refer you to a mental health specialist for further evaluation. Your health care provider will do a thorough examination and may perform additional tests to rule out other possible causes of your symptoms. To be diagnosed with GAD, a person must have anxiety that:  Is out of his or her control.  Affects several different aspects of his or her life, such as work and relationships.  Causes distress that makes him or her unable to take part in normal activities.  Includes at least three physical symptoms of GAD, such as restlessness, fatigue, trouble concentrating, irritability, muscle tension, or sleep problems. Before your health care provider can confirm a diagnosis of GAD, these symptoms must be present more days than they are not, and they must last for six months or longer. How is this treated? The following therapies are usually used to treat GAD:  Medicine. Antidepressant medicine is usually prescribed for long-term daily control. Antianxiety medicines may be added in severe cases, especially when panic attacks occur.  Talk therapy (psychotherapy). Certain types of talk therapy can be helpful in treating GAD by providing support, education, and guidance. Options include: ? Cognitive behavioral therapy (CBT). People learn coping skills and techniques to ease their anxiety. They learn to identify unrealistic or negative thoughts and behaviors and to replace them with positive ones. ? Acceptance and commitment therapy (ACT). This treatment teaches people how to be mindful as a way to cope with unwanted thoughts and feelings. ? Biofeedback. This process trains you to manage your body's response (  physiological response) through breathing techniques and relaxation methods. You will work with a therapist while machines are used to monitor your physical symptoms.  Stress  management techniques. These include yoga, meditation, and exercise. A mental health specialist can help determine which treatment is best for you. Some people see improvement with one type of therapy. However, other people require a combination of therapies. Follow these instructions at home:  Take over-the-counter and prescription medicines only as told by your health care provider.  Try to maintain a normal routine.  Try to anticipate stressful situations and allow extra time to manage them.  Practice any stress management or self-calming techniques as taught by your health care provider.  Do not punish yourself for setbacks or for not making progress.  Try to recognize your accomplishments, even if they are small.  Keep all follow-up visits as told by your health care provider. This is important. Contact a health care provider if:  Your symptoms do not get better.  Your symptoms get worse.  You have signs of depression, such as: ? A persistently sad, cranky, or irritable mood. ? Loss of enjoyment in activities that used to bring you joy. ? Change in weight or eating. ? Changes in sleeping habits. ? Avoiding friends or family members. ? Loss of energy for normal tasks. ? Feelings of guilt or worthlessness. Get help right away if:  You have serious thoughts about hurting yourself or others. If you ever feel like you may hurt yourself or others, or have thoughts about taking your own life, get help right away. You can go to your nearest emergency department or call:  Your local emergency services (911 in the [place]  A suicide crisis helpline, such as the [facility] at (804)020-0302. This is open 24 hours a day. Summary  Generalized anxiety disorder (GAD) is a mental health disorder that involves worry that is not triggered by a specific event.  People with GAD often worry excessively about many things in their lives, such as their health and  family.  GAD may cause physical symptoms such as restlessness, trouble concentrating, sleep problems, frequent sweating, nausea, diarrhea, headaches, and trembling or muscle twitching.  A mental health specialist can help determine which treatment is best for you. Some people see improvement with one type of therapy. However, other people require a combination of therapies. This information is not intended to replace advice given to you by your health care provider. Make sure you discuss any questions you have with your health care provider. Document Revised: 05/08/2017 Document Reviewed: 04/15/2016 Elsevier Patient Education  2020 [place] With Depression Everyone experiences occasional disappointment, sadness, and loss in their lives. When you are feeling down, blue, or sad for at least 2 weeks in a row, it may mean that you have depression. Depression can affect your thoughts and feelings, relationships, daily activities, and physical health. It is caused by changes in the way your brain functions. If you receive a diagnosis of depression, your health care provider will tell you which type of depression you have and what treatment options are available to you. If you are living with depression, there are ways to help you recover from it and also ways to prevent it from coming back. How to cope with lifestyle changes Coping with stress     Stress is your body's reaction to life changes and events, both good and bad. Stressful situations may include:  Getting married.  The death of a spouse.  Losing a job.  Retiring.  Having a baby. Stress can last just a few hours or it can be ongoing. Stress can play a major role in depression, so it is important to learn both how to cope with stress and how to think about it differently. Talk with your health care provider or a counselor if you would like to learn more about stress reduction. He or she may suggest some stress reduction  techniques, such as:  Music therapy. This can include creating music or listening to music. Choose music that you enjoy and that inspires you.  Mindfulness-based meditation. This kind of meditation can be done while sitting or walking. It involves being aware of your normal breaths, rather than trying to control your breathing.  Centering prayer. This is a kind of meditation that involves focusing on a spiritual word or phrase. Choose a word, phrase, or sacred image that is meaningful to you and that brings you peace.  Deep breathing. To do this, expand your stomach and inhale slowly through your nose. Hold your breath for 3-5 seconds, then exhale slowly, allowing your stomach muscles to relax.  Muscle relaxation. This involves intentionally tensing muscles then relaxing them. Choose a stress reduction technique that fits your lifestyle and personality. Stress reduction techniques take time and practice to develop. Set aside 5-15 minutes a day to do them. Therapists can offer training in these techniques. The training may be covered by some insurance plans. Other things you can do to manage stress include:  Keeping a stress diary. This can help you learn what triggers your stress and ways to control your response.  Understanding what your limits are and saying no to requests or events that lead to a schedule that is too full.  Thinking about how you respond to certain situations. You may not be able to control everything, but you can control how you react.  Adding humor to your life by watching funny films or TV shows.  Making time for activities that help you relax and not feeling guilty about spending your time this way.  Medicines Your health care provider may suggest certain medicines if he or she feels that they will help improve your condition. Avoid using alcohol and other substances that may prevent your medicines from working properly (may interact). It is also important to:  Talk  with your pharmacist or health care provider about all the medicines that you take, their possible side effects, and what medicines are safe to take together.  Make it your goal to take part in all treatment decisions (shared decision-making). This includes giving input on the side effects of medicines. It is best if shared decision-making with your health care provider is part of your total treatment plan. If your health care provider prescribes a medicine, you may not notice the full benefits of it for 4-8 weeks. Most people who are treated for depression need to be on medicine for at least 6-12 months after they feel better. If you are taking medicines as part of your treatment, do not stop taking medicines without first talking to your health care provider. You may need to have the medicine slowly decreased (tapered) over time to decrease the risk of harmful side effects. Relationships Your health care provider may suggest family therapy along with individual therapy and drug therapy. While there may not be family problems that are causing you to feel depressed, it is still important to make sure your family learns as much as they can  about your mental health. Having your family's support can help make your treatment successful. How to recognize changes in your condition Everyone has a different response to treatment for depression. Recovery from major depression happens when you have not had signs of major depression for two months. This may mean that you will start to:  Have more interest in doing activities.  Feel less hopeless than you did 2 months ago.  Have more energy.  Overeat less often, or have better or improving appetite.  Have better concentration. Your health care provider will work with you to decide the next steps in your recovery. It is also important to recognize when your condition is getting worse. Watch for these signs:  Having fatigue or low energy.  Eating too much or  too little.  Sleeping too much or too little.  Feeling restless, agitated, or hopeless.  Having trouble concentrating or making decisions.  Having unexplained physical complaints.  Feeling irritable, angry, or aggressive. Get help as soon as you or your family members notice these symptoms coming back. How to get support and help from others How to talk with friends and family members about your condition  Talking to friends and family members about your condition can provide you with one way to get support and guidance. Reach out to trusted friends or family members, explain your symptoms to them, and let them know that you are working with a health care provider to treat your depression. Financial resources Not all insurance plans cover mental health care, so it is important to check with your insurance carrier. If paying for co-pays or counseling services is a problem, search for a local or county mental health care center. They may be able to offer public mental health care services at low or no cost when you are not able to see a private health care provider. If you are taking medicine for depression, you may be able to get the generic form, which may be less expensive. Some makers of prescription medicines also offer help to patients who cannot afford the medicines they need. Follow these instructions at home:   Get the right amount and quality of sleep.  Cut down on using caffeine, tobacco, alcohol, and other potentially harmful substances.  Try to exercise, such as walking or lifting small weights.  Take over-the-counter and prescription medicines only as told by your health care provider.  Eat a healthy diet that includes plenty of vegetables, fruits, whole grains, low-fat dairy products, and lean protein. Do not eat a lot of foods that are high in solid fats, added sugars, or salt.  Keep all follow-up visits as told by your health care provider. This is important. Contact a  health care provider if:  You stop taking your antidepressant medicines, and you have any of these symptoms: ? Nausea. ? Headache. ? Feeling lightheaded. ? Chills and body aches. ? Not being able to sleep (insomnia).  You or your friends and family think your depression is getting worse. Get help right away if:  You have thoughts of hurting yourself or others. If you ever feel like you may hurt yourself or others, or have thoughts about taking your own life, get help right away. You can go to your nearest emergency department or call:  Your local emergency services (911 in the [place]  A suicide crisis helpline, such as the [facility] at 437-865-2080. This is open 24-hours a day. Summary  If you are living with depression, there  are ways to help you recover from it and also ways to prevent it from coming back.  Work with your health care team to create a management plan that includes counseling, stress management techniques, and healthy lifestyle habits. This information is not intended to replace advice given to you by your health care provider. Make sure you discuss any questions you have with your health care provider. Document Revised: 09/17/2018 Document Reviewed: 04/28/2016 Elsevier Patient Education  [place] and Cholesterol Restricted Eating Plan Getting too much fat and cholesterol in your diet may cause health problems. Choosing the right foods helps keep your fat and cholesterol at normal levels. This can keep you from getting certain diseases. Your doctor may recommend an eating plan that includes:  Total fat: ______% or less of total calories a day.  Saturated fat: ______% or less of total calories a day.  Cholesterol: less than _________mg a day.  Fiber: ______g a day. What are tips for following this plan? Meal planning  At meals, divide your plate into four equal parts: ? Fill one-half of your plate with vegetables  and green salads. ? Fill one-fourth of your plate with whole grains. ? Fill one-fourth of your plate with low-fat (lean) protein foods.  Eat fish that is high in omega-3 fats at least two times a week. This includes mackerel, tuna, sardines, and salmon.  Eat foods that are high in fiber, such as whole grains, beans, apples, broccoli, carrots, peas, and barley. General tips   Work with your doctor to lose weight if you need to.  Avoid: ? Foods with added sugar. ? Fried foods. ? Foods with partially hydrogenated oils.  Limit alcohol intake to no more than 1 drink a day for nonpregnant women and 2 drinks a day for men. One drink equals 12 oz of beer, 5 oz of wine, or 1 oz of hard liquor. Reading food labels  Check food labels for: ? Trans fats. ? Partially hydrogenated oils. ? Saturated fat (g) in each serving. ? Cholesterol (mg) in each serving. ? Fiber (g) in each serving.  Choose foods with healthy fats, such as: ? Monounsaturated fats. ? Polyunsaturated fats. ? Omega-3 fats.  Choose grain products that have whole grains. Look for the word "whole" as the first word in the ingredient list. Cooking  Cook foods using low-fat methods. These include baking, boiling, grilling, and broiling.  Eat more home-cooked foods. Eat at restaurants and buffets less often.  Avoid cooking using saturated fats, such as butter, cream, palm oil, palm kernel oil, and coconut oil. Recommended foods  Fruits  All fresh, canned (in natural juice), or frozen fruits. Vegetables  Fresh or frozen vegetables (raw, steamed, roasted, or grilled). Green salads. Grains  Whole grains, such as whole wheat or whole grain breads, crackers, cereals, and pasta. Unsweetened oatmeal, bulgur, barley, quinoa, or brown rice. Corn or whole wheat flour tortillas. Meats and other protein foods  Ground beef (85% or leaner), grass-fed beef, or beef trimmed of fat. Skinless chicken or [place]. Ground chicken or  [place]. Pork trimmed of fat. All fish and seafood. Egg whites. Dried beans, peas, or lentils. Unsalted nuts or seeds. Unsalted canned beans. Nut butters without added sugar or oil. Dairy  Low-fat or nonfat dairy products, such as skim or 1% milk, 2% or reduced-fat cheeses, low-fat and fat-free ricotta or cottage cheese, or plain low-fat and nonfat yogurt. Fats and oils  Tub margarine without trans fats. Light or reduced-fat mayonnaise  and salad dressings. Avocado. Olive, canola, sesame, or safflower oils. The items listed above may not be a complete list of foods and beverages you can eat. Contact a dietitian for more information. Foods to avoid Fruits  Canned fruit in heavy syrup. Fruit in cream or butter sauce. Fried fruit. Vegetables  Vegetables cooked in cheese, cream, or butter sauce. Fried vegetables. Grains  White bread. White pasta. White rice. Cornbread. Bagels, pastries, and croissants. Crackers and snack foods that contain trans fat and hydrogenated oils. Meats and other protein foods  Fatty cuts of meat. Ribs, chicken wings, bacon, sausage, bologna, salami, chitterlings, fatback, hot dogs, bratwurst, and packaged lunch meats. Liver and organ meats. Whole eggs and egg yolks. Chicken and [place] with skin. Fried meat. Dairy  Whole or 2% milk, cream, half-and-half, and cream cheese. Whole milk cheeses. Whole-fat or sweetened yogurt. Full-fat cheeses. Nondairy creamers and whipped toppings. Processed cheese, cheese spreads, and cheese curds. Beverages  Alcohol. Sugar-sweetened drinks such as sodas, lemonade, and fruit drinks. Fats and oils  Butter, stick margarine, lard, shortening, ghee, or bacon fat. Coconut, palm kernel, and palm oils. Sweets and desserts  Corn syrup, sugars, honey, and molasses. Candy. Jam and jelly. Syrup. Sweetened cereals. Cookies, pies, cakes, donuts, muffins, and ice cream. The items listed above may not be a complete list of foods and beverages you  should avoid. Contact a dietitian for more information. Summary  Choosing the right foods helps keep your fat and cholesterol at normal levels. This can keep you from getting certain diseases.  At meals, fill one-half of your plate with vegetables and green salads.  Eat high-fiber foods, like whole grains, beans, apples, carrots, peas, and barley.  Limit added sugar, saturated fats, alcohol, and fried foods. This information is not intended to replace advice given to you by your health care provider. Make sure you discuss any questions you have with your health care provider. Document Revised: 01/27/2018 Document Reviewed: 02/10/2017 Elsevier Patient Education  2020 [employer].

## 2019-10-06 NOTE — Progress Notes (Signed)
Established patient visit   Patient: April Davenport   DOB: 1990-06-15   29 y.o. Female  MRN: [id number] Visit Date: 10/06/2019  Today's healthcare provider: Jairo BenMichelle Smith Talaya Lamprecht, FNP   Chief Complaint  Patient presents with  . Hypertension  . Anxiety   Subjective    HPI    Hypertension, follow-up  BP Readings from Last 3 Encounters:  10/06/19 110/78  10/03/19 123/88  09/13/19 117/82   Wt Readings from Last 3 Encounters:  10/06/19 195 lb 6.4 oz (88.6 kg)  09/13/19 197 lb 12.8 oz (89.7 kg)  07/14/19 216 lb (98 kg)     She was last seen for hypertension 3 weeks ago.  BP at that visit was 117/82. Management since that visit includes.  She is following a Regular diet. She is exercising 2x a week. She does not smoke.  Use of agents associated with hypertension: none.   Outside blood pressures are elevated systolic 140-180, diastolic >90. Symptoms: No chest pain No chest pressure Yes palpitations, patient reports she notices when she exercises HR seems irregular No dyspnea No orthopnea No paroxysmal nocturnal dyspnea No lower extremity edema No syncope  No elevated blood pressure readings at home.   Pertinent labs: Lab Results  Component Value Date   CHOL 210 (H) 09/14/2019   HDL 68 09/14/2019   LDLCALC 132 (H) 09/14/2019   TRIG 55 09/14/2019   Lab Results  Component Value Date   NA 142 09/14/2019   K 3.9 09/14/2019   CO2 23 09/14/2019   GLUCOSE 85 09/14/2019   BUN 13 09/14/2019   CREATININE 0.81 09/14/2019   CALCIUM 9.9 09/14/2019   GFRNONAA 98 09/14/2019   GFRAA 113 09/14/2019     The ASCVD Risk score (Goff DC Jr., et al., 2013) failed to calculate for the following reasons:   The 2013 ASCVD risk score is only valid for ages 3131 to 4379    Anxiety Today patient states that she would like to address symptoms of anxiety that were triggered in in late 2019. Patient reports that she gave birth to her child on 06/30/19 and has concerns of  returning back to a office setting with her increasing depression and anxiety. She has had  some depression since giving birth,   Patient states that she has been going [facility] and states that she has seen Renie OraAundrea Figueroa and Ollen Grossonnie Wolfe, LSW for issues with post partum depression. Patient states that she has spoken with her employer who could accommodate patient to work from home but she would need clearance from PCP due to her anxiety and depression..She is in a very stressful relationship.  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    She is breastfeeding. Lack of sleep. She does not want to do medications now.  She saw psychiatry Ollen Grossonnie Wolfe and also is seeing LSW.   She is not taking a prenatal vitamin.  Denies any abuse. Denies syncope/ presyncope, dizziness, or lightheadedness.  Patient  denies any fever, body aches,chills, rash, chest pain, shortness of breath, nausea, vomiting, or diarrhea.   Also saw Dr. Sherrie MustacheFisher 10/03/19 for head injury, denies any change other than improvement. No neurological symptoms. Denies headache.   GAD 7 : Generalized Anxiety Score 10/06/2019 09/13/2019  Nervous, Anxious, on Edge 2 3  Control/stop worrying 1 2  Worry too much - different things 2 2  Trouble relaxing 1 1  Restless 1 0  Easily annoyed or irritable 2 1  Afraid - awful might happen 2 2  Total GAD 7 Score 11 11  Anxiety Difficulty Somewhat difficult Somewhat difficult     ---------------------------------------------------------------------------------------------------   Patient Active Problem List   Diagnosis Date Noted  . History of hyperthyroidism 09/13/2019  . Lactating mother 09/13/2019  . Vitamin D deficiency 09/13/2019  . Stress headaches 09/13/2019  . Prediabetes 09/13/2019  . Acute non-recurrent frontal sinusitis 09/13/2019  . Seasonal allergies 09/13/2019  . Anemia 09/13/2019  . Acute serous otitis media of left ear 09/13/2019  . Low TSH  level 12/13/2018  . History of pre-eclampsia 12/09/2018  . Maternal morbid obesity, antepartum (HCC) 12/09/2018  . History of kidney stones 03/18/2018  . Obesity, Class III, BMI 40-49.9 (morbid obesity) (HCC) 03/18/2018  . IUD (intrauterine device) in place 08/19/2013  . Hypertension 06/27/2013  . Birth control counseling 06/27/2013  . Postpartum care following cesarean delivery 06/27/2013  . Cesarean delivery delivered 05/30/2013  . Preterm delivery, delivered 05/30/2013  . Antepartum mild preeclampsia 05/15/2013  . Encounter for supervision of normal pregnancy in multigravida 01/20/2013  . Family history of diabetes mellitus type II 01/20/2013  . Obesity (BMI 35.0-39.9 without comorbidity) 12/15/2012  . LGSIL (low grade squamous intraepithelial lesion) on Pap smear 01/05/2012  . Cervical high risk human papillomavirus (HPV) DNA test positive 11/20/2011  . 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 Allergy   . Anemia   . Anxiety   . Depression   . Hypertension   . Lactating mother   . Right nephrolithiasis    Social History   Tobacco Use  . Smoking status: Never Smoker  . Smokeless tobacco: Never Used  Substance Use Topics  . Alcohol use: Not Currently    Comment: rare  . Drug use: Never   Family History  Problem Relation Age of Onset  . Diabetes Mother   . Hypertension Mother   . Heart attack Mother   . Heart attack Father   . Hypertension Father   . Diabetes Father   . Stroke Father   . Stroke Maternal Grandfather   . Diabetes Paternal Grandfather   . Asthma Daughter   . Sickle cell anemia Maternal Aunt   . Heart Problems Maternal Aunt    Allergies  Allergen Reactions  . Dilaudid [Hydromorphone Hcl] Hives, Shortness Of Breath and Itching    Syncope  . Black [employer] and Swelling  . Coconut Flavor Swelling       Medications: Outpatient Medications Prior to Visit  Medication Sig  .  fluticasone (FLONASE ALLERGY RELIEF) 50 MCG/ACT nasal spray Place 2 sprays into both nostrils daily. (Patient not taking: Reported on 10/03/2019)   No facility-administered medications prior to visit.    Review of Systems  Constitutional: Positive for fatigue. Negative for activity change, appetite change, chills, diaphoresis, fever and unexpected weight change.  HENT: Negative.   Eyes: Negative.   Respiratory: Negative.  Negative for apnea, cough, choking, chest tightness, shortness of breath, wheezing and stridor.   Cardiovascular: Negative.  Negative for chest pain, palpitations and leg swelling.  Gastrointestinal: Negative.   Endocrine: Negative for polydipsia, polyphagia and polyuria.  Genitourinary: Negative.   Skin: Positive for wound (c- section wound is healing well, denies any issues or concerns.). Negative for color change and rash.  Neurological: Negative for dizziness, speech difficulty, weakness, light-headedness, numbness and headaches.  Psychiatric/Behavioral: Positive for agitation and decreased concentration. Negative for behavioral problems, confusion, dysphoric mood, hallucinations, self-injury,  sleep disturbance and suicidal ideas. The patient is nervous/anxious. The patient is not hyperactive.     Last CBC Lab Results  Component Value Date   WBC 6.9 09/14/2019   HGB 12.7 09/14/2019   HCT 40.1 09/14/2019   MCV 83 09/14/2019   MCH 26.2 (L) 09/14/2019   RDW 13.0 09/14/2019   PLT 387 09/14/2019   Last metabolic panel Lab Results  Component Value Date   GLUCOSE 85 09/14/2019   NA 142 09/14/2019   K 3.9 09/14/2019   CL 103 09/14/2019   CO2 23 09/14/2019   BUN 13 09/14/2019   CREATININE 0.81 09/14/2019   GFRNONAA 98 09/14/2019   GFRAA 113 09/14/2019   CALCIUM 9.9 09/14/2019   PROT 7.2 09/14/2019   ALBUMIN 4.4 09/14/2019   LABGLOB 2.8 09/14/2019   AGRATIO 1.6 09/14/2019   BILITOT 0.7 09/14/2019   ALKPHOS 122 (H) 09/14/2019   AST 19 09/14/2019   ALT 11  09/14/2019   ANIONGAP 8 07/01/2019   Last lipids Lab Results  Component Value Date   CHOL 210 (H) 09/14/2019   HDL 68 09/14/2019   LDLCALC 132 (H) 09/14/2019   TRIG 55 09/14/2019   Last hemoglobin A1c Lab Results  Component Value Date   HGBA1C 5.4 09/14/2019   Last thyroid functions Lab Results  Component Value Date   TSH 0.927 09/14/2019   T4TOTAL 6.9 09/14/2019   Last vitamin D Lab Results  Component Value Date   VD25OH 14.4 (L) 09/14/2019   Last vitamin B12 and Folate No results found for: VITAMINB12, FOLATE    Objective    BP 110/78   Pulse 81   Temp (!) 96.6 F (35.9 C) (Oral)   Resp 16   Wt 195 lb 6.4 oz (88.6 kg)   LMP 06/30/2019 (Exact Date)   SpO2 98%   Breastfeeding Yes   BMI 36.92 kg/m  BP Readings from Last 3 Encounters:  10/06/19 110/78  10/03/19 123/88  09/13/19 117/82      Physical Exam Vitals reviewed.  Constitutional:      General: She is not in acute distress.    Appearance: She is well-developed. She is not diaphoretic.     Interventions: She is not intubated. HENT:     Head: Normocephalic and atraumatic.     Right Ear: External ear normal.     Left Ear: External ear normal.     Nose: Nose normal.     Mouth/Throat:     Pharynx: No oropharyngeal exudate.  Eyes:     General: Lids are normal. No scleral icterus.       Right eye: No discharge.        Left eye: No discharge.     Conjunctiva/sclera: Conjunctivae normal.     Right eye: Right conjunctiva is not injected. No exudate or hemorrhage.    Left eye: Left conjunctiva is not injected. No exudate or hemorrhage.    Pupils: Pupils are equal, round, and reactive to light.  Neck:     Thyroid: No thyroid mass or thyromegaly.     Vascular: Normal carotid pulses. No carotid bruit, hepatojugular reflux or JVD.     Trachea: Trachea and phonation normal. No tracheal tenderness or tracheal deviation.     Meningeal: Brudzinski's sign and Kernig's sign absent.  Cardiovascular:     Rate  and Rhythm: Normal rate and regular rhythm.     Pulses: Normal pulses.          Radial pulses are 2+ on  the right side and 2+ on the left side.       Dorsalis pedis pulses are 2+ on the right side and 2+ on the left side.       Posterior tibial pulses are 2+ on the right side and 2+ on the left side.     Heart sounds: Normal heart sounds, S1 normal and S2 normal. Heart sounds not distant. No murmur. No friction rub. No gallop.   Pulmonary:     Effort: Pulmonary effort is normal. No tachypnea, bradypnea, accessory muscle usage or respiratory distress. She is not intubated.     Breath sounds: Normal breath sounds. No stridor. No wheezing, rhonchi or rales.  Chest:     Chest wall: No tenderness.  Abdominal:     General: Bowel sounds are normal. There is no distension or abdominal bruit.     Palpations: Abdomen is soft. There is no shifting dullness, fluid wave, hepatomegaly, splenomegaly, mass or pulsatile mass.     Tenderness: There is no abdominal tenderness. There is no right CVA tenderness, left CVA tenderness, guarding or rebound.     Hernia: No hernia is present.  Genitourinary:    Comments: deferred by patient - awaiting on last Pap records. Will do at follow up if needed.   Musculoskeletal:        General: No tenderness or deformity. Normal range of motion.     Cervical back: Full passive range of motion without pain, normal range of motion and neck supple. No edema, erythema or rigidity. No spinous process tenderness or muscular tenderness. Normal range of motion.  Lymphadenopathy:     Head:     Right side of head: No submental, submandibular, tonsillar, preauricular, posterior auricular or occipital adenopathy.     Left side of head: No submental, submandibular, tonsillar, preauricular, posterior auricular or occipital adenopathy.     Cervical: No cervical adenopathy.     Right cervical: No superficial, deep or posterior cervical adenopathy.    Left cervical: No superficial, deep  or posterior cervical adenopathy.     Upper Body:     Right upper body: No supraclavicular or pectoral adenopathy.     Left upper body: No supraclavicular or pectoral adenopathy.  Skin:    General: Skin is warm and dry.     Coloration: Skin is not pale.     Findings: No abrasion, bruising, burn, ecchymosis, erythema, lesion, petechiae or rash.     Nails: There is no clubbing.  Neurological:     Mental Status: She is alert and oriented to person, place, and time.     GCS: GCS eye subscore is 4. GCS verbal subscore is 5. GCS motor subscore is 6.     Cranial Nerves: No cranial nerve deficit.     Sensory: No sensory deficit.     Motor: No tremor, atrophy, abnormal muscle tone or seizure activity.     Coordination: Coordination normal.     Gait: Gait normal.     Deep Tendon Reflexes: Reflexes are normal and symmetric. Reflexes normal. Babinski sign absent on the right side. Babinski sign absent on the left side.     Reflex Scores:      Tricep reflexes are 2+ on the right side and 2+ on the left side.      Bicep reflexes are 2+ on the right side and 2+ on the left side.      Brachioradialis reflexes are 2+ on the right side and 2+ on the left side.  Patellar reflexes are 2+ on the right side and 2+ on the left side.      Achilles reflexes are 2+ on the right side and 2+ on the left side. Psychiatric:        Mood and Affect: Mood normal.        Speech: Speech normal.        Behavior: Behavior normal.        Thought Content: Thought content normal.        Judgment: Judgment normal.        Post partum Edinburgh score of 10.  GAD 7 : Generalized Anxiety Score 10/06/2019 09/13/2019  Nervous, Anxious, on Edge 2 3  Control/stop worrying 1 2  Worry too much - different things 2 2  Trouble relaxing 1 1  Restless 1 0  Easily annoyed or irritable 2 1  Afraid - awful might happen 2 2  Total GAD 7 Score 11 11  Anxiety Difficulty Somewhat difficult Somewhat difficult     No results  found for any visits on 10/06/19.  Assessment & Plan     Postpartum depression  Vitamin D deficiency  Generalized anxiety disorder  Body mass index (BMI) of 36.0-36.9 in adult  Breast feeding status of mother  Meds ordered this encounter  Medications  . cholecalciferol (VITAMIN D) 25 MCG (1000 UNIT) tablet    Sig: Take 1 tablet (1,000 Units total) by mouth daily.    Dispense:  90 tablet    Refill:  0    She is breastfeeding  . Multiple Vitamin (MULTIVITAMIN) capsule    Sig: Take 1 capsule by mouth daily.    Dispense:  90 capsule    Refill:  0    Breastfeeding and low vitamin D. Also sent Vitamin D 1,000 script. Please verify total vitamin D   Recheck Vitamin D, CMP  at next visit as well as CBC, with history of anemia.  Healthy diet and activity discussed with lactation.  She is lactating and prefers to continue counseling - she also has a psychiatrist. She may decide to see someone local in [place] and will let office know should she desire a local psychiatrist.  She has low vitamin D is breastfeeding and not taking a vitamin.  Folow up on PAP at next visit and to see if records received.  Advised patient call the office or your primary care doctor for an appointment if no improvement within 72 hours or if any symptoms change or worsen at any time  Advised ER or urgent Care if after hours or on weekend. Call 911 for emergency symptoms at any time.Patinet verbalized understanding of all instructions given/reviewed and treatment plan and has no further questions or concerns at this time.    After visit summary given and reviewed.   Paperwork filled out for her work to accommodate her working at home.    Return in about 3 months (around 01/05/2020), or if symptoms worsen or fail to improve, for at any time for any worsening symptoms, Go to Emergency room/ urgent care if worse.      IBeverely Pace Taraann Olthoff, FNP, have reviewed all documentation for this visit. The  documentation on 10/06/19 for the exam, diagnosis, procedures, and orders are all accurate and complete.  Addressed extensive list of chronic and acute medical problems today requiring 45  minutes reviewing her medical record, counseling patient regarding her conditions and coordination of care.     Jairo Ben, FNP  [facility]  [phone] (phone) [phone] (fax)  [place]

## 2019-10-17 ENCOUNTER — Encounter: Payer: Self-pay | Admitting: Adult Health

## 2019-10-17 ENCOUNTER — Other Ambulatory Visit: Payer: Self-pay | Admitting: Adult Health

## 2019-10-17 DIAGNOSIS — F53 Postpartum depression: Secondary | ICD-10-CM

## 2019-10-17 DIAGNOSIS — F411 Generalized anxiety disorder: Secondary | ICD-10-CM

## 2019-10-20 ENCOUNTER — Encounter: Payer: Self-pay | Admitting: Adult Health

## 2019-10-20 ENCOUNTER — Telehealth: Payer: Self-pay | Admitting: Adult Health

## 2019-10-20 DIAGNOSIS — F4010 Social phobia, unspecified: Secondary | ICD-10-CM | POA: Insufficient documentation

## 2019-10-20 NOTE — Telephone Encounter (Signed)
Pt has picked up her FMLA forms from office and forms will be scanned into asap. TNP

## 2019-11-09 ENCOUNTER — Encounter: Payer: Self-pay | Admitting: Adult Health

## 2019-11-10 ENCOUNTER — Encounter: Payer: Self-pay | Admitting: Family Medicine

## 2019-11-10 ENCOUNTER — Ambulatory Visit (INDEPENDENT_AMBULATORY_CARE_PROVIDER_SITE_OTHER): Payer: Federal, State, Local not specified - PPO | Admitting: Family Medicine

## 2019-11-10 ENCOUNTER — Other Ambulatory Visit: Payer: Self-pay

## 2019-11-10 ENCOUNTER — Other Ambulatory Visit
Admission: RE | Admit: 2019-11-10 | Discharge: 2019-11-10 | Disposition: A | Discharge status: Home / Self Care | Payer: Federal, State, Local not specified - PPO | Source: Ambulatory Visit | Attending: Adult Health | Admitting: Adult Health

## 2019-11-10 ENCOUNTER — Other Ambulatory Visit: Payer: Self-pay | Admitting: Adult Health

## 2019-11-10 ENCOUNTER — Ambulatory Visit: Payer: Self-pay | Admitting: Adult Health

## 2019-11-10 VITALS — BP 130/90 | HR 75 | Temp 96.2°F | Wt 188.0 lb

## 2019-11-10 DIAGNOSIS — N912 Amenorrhea, unspecified: Secondary | ICD-10-CM | POA: Diagnosis not present

## 2019-11-10 DIAGNOSIS — D649 Anemia, unspecified: Secondary | ICD-10-CM | POA: Diagnosis not present

## 2019-11-10 DIAGNOSIS — R109 Unspecified abdominal pain: Secondary | ICD-10-CM

## 2019-11-10 DIAGNOSIS — L650 Telogen effluvium: Secondary | ICD-10-CM | POA: Diagnosis not present

## 2019-11-10 DIAGNOSIS — Z9851 Tubal ligation status: Secondary | ICD-10-CM | POA: Insufficient documentation

## 2019-11-10 LAB — CBC WITH DIFFERENTIAL/PLATELET
Abs Immature Granulocytes: 0.01 10*3/uL (ref 0.00–0.07)
Basophils Absolute: 0.1 10*3/uL (ref 0.0–0.1)
Basophils Relative: 1 %
Eosinophils Absolute: 0.2 10*3/uL (ref 0.0–0.5)
Eosinophils Relative: 3 %
HCT: 40.6 % (ref 36.0–46.0)
Hemoglobin: 12.9 g/dL (ref 12.0–15.0)
Immature Granulocytes: 0 %
Lymphocytes Relative: 35 %
Lymphs Abs: 2.5 10*3/uL (ref 0.7–4.0)
MCH: 25.7 pg — ABNORMAL LOW (ref 26.0–34.0)
MCHC: 31.8 g/dL (ref 30.0–36.0)
MCV: 81 fL (ref 80.0–100.0)
Monocytes Absolute: 0.4 10*3/uL (ref 0.1–1.0)
Monocytes Relative: 5 %
Neutro Abs: 4.1 10*3/uL (ref 1.7–7.7)
Neutrophils Relative %: 56 %
Platelets: 348 10*3/uL (ref 150–400)
RBC: 5.01 MIL/uL (ref 3.87–5.11)
RDW: 13.6 % (ref 11.5–15.5)
WBC: 7.3 10*3/uL (ref 4.0–10.5)
nRBC: 0 % (ref 0.0–0.2)

## 2019-11-10 LAB — COMPREHENSIVE METABOLIC PANEL
ALT: 11 U/L (ref 0–44)
AST: 15 U/L (ref 15–41)
Albumin: 4.1 g/dL (ref 3.5–5.0)
Alkaline Phosphatase: 98 U/L (ref 38–126)
Anion gap: 10 (ref 5–15)
BUN: 19 mg/dL (ref 6–20)
CO2: 27 mmol/L (ref 22–32)
Calcium: 9.5 mg/dL (ref 8.9–10.3)
Chloride: 104 mmol/L (ref 98–111)
Creatinine, Ser: 0.76 mg/dL (ref 0.44–1.00)
GFR calc Af Amer: >60 mL/min (ref >60)
GFR calc non Af Amer: >60 mL/min (ref >60)
Glucose, Bld: 89 mg/dL (ref 70–99)
Potassium: 3.9 mmol/L (ref 3.5–5.1)
Sodium: 141 mmol/L (ref 135–145)
Total Bilirubin: 0.8 mg/dL (ref 0.3–1.2)
Total Protein: 7.9 g/dL (ref 6.5–8.1)

## 2019-11-10 LAB — IRON AND TIBC
Iron: 43 ug/dL (ref 28–170)
Saturation Ratios: 11 % (ref 10.4–31.8)
TIBC: 378 ug/dL (ref 250–450)
UIBC: 335 ug/dL

## 2019-11-10 LAB — FERRITIN: Ferritin: 27 ng/mL (ref 11–307)

## 2019-11-10 LAB — HCG, QUANTITATIVE, PREGNANCY: hCG, Beta Chain, Quant, S: 1 m[IU]/mL (ref <5)

## 2019-11-10 NOTE — Telephone Encounter (Signed)
Pt reports abdominal cramping x 2-3 months, she is 4 months postpartum. Intermittent, lower abdomen,5/10 when occurs. States had "Bad one last night,pressure above belly button, vomited once then felt better."  States when cramping occurs, "Lasts about 15 minutes."  None presently. Denies fever, no diarrhea. Also reports "Hair is falling out." States she took 3 OTC pregnancy tests, all negative.  Pt has appt 11/17/2019 with PCP. Attempted to schedule earlier, declined all other appt availability due to her schedule. States "The 10th works best for me,it's really not that bad."  Advised ED if symptoms worsen. Pt verbalizes understanding.  Reason for Disposition . Abdominal pain is a chronic symptom (recurrent or ongoing AND present > 4 weeks)  Answer Assessment - Initial Assessment Questions 1. LOCATION: "Where does it hurt?"      Lower abdomen usually, last night upper, above navel 2. RADIATION: "Does the pain shoot anywhere else?" (e.g., chest, back)     no 3. ONSET: "When did the pain begin?" (e.g., minutes, hours or days ago)      2-3 months ago 4. SUDDEN: "Gradual or sudden onset?"     gradual 5. PATTERN "Does the pain come and go, or is it constant?"    - If constant: "Is it getting better, staying the same, or worsening?"      (Note: Constant means the pain never goes away completely; most serious pain is constant and it progresses)     - If intermittent: "How long does it last?" "Do you have pain now?"     (Note: Intermittent means the pain goes away completely between bouts)     Intermittent 6. SEVERITY: "How bad is the pain?"  (e.g., Scale 1-10; mild, moderate, or severe)   - MILD (1-3): doesn't interfere with normal activities, abdomen soft and not tender to touch    - MODERATE (4-7): interferes with normal activities or awakens from sleep, tender to touch    - SEVERE (8-10): excruciating pain, doubled over, unable to do any normal activities      5-/10   "2 episodes were 8/10, 1  month ago and lst Thursday bad ones" 7. RECURRENT SYMPTOM: "Have you ever had this type of abdominal pain before?" If so, ask: "When was the last time?" and "What happened that time?"      no 8. CAUSE: "What do you think is causing the abdominal pain?"     Not sure 9. RELIEVING/AGGRAVATING FACTORS: "What makes it better or worse?" (e.g., movement, antacids, bowel movement)    Tylenol at times, helps a little 10. OTHER SYMPTOMS: "Has there been any vomiting, diarrhea, constipation, or urine problems?"       Nausea, 1 episode vomiting last night, Generalized weakness. 11. PREGNANCY: "Is there any chance you are pregnant?" "When was your last menstrual period?"       Did 3 tests, negative  Protocols used: ABDOMINAL PAIN - [facility]

## 2019-11-10 NOTE — Patient Instructions (Signed)

## 2019-11-10 NOTE — Telephone Encounter (Signed)
Thanks I did see that this morning upon further chart review after ordering labs and did add to diagnosis list. Thanks for seeing her.

## 2019-11-10 NOTE — Progress Notes (Signed)
History of bilateral tubal ligation- 06/30/19

## 2019-11-10 NOTE — Telephone Encounter (Signed)
Of note, patient had salpingectomy for sterilization during her C-section in 06/2019. Will eval at 340

## 2019-11-10 NOTE — Telephone Encounter (Signed)
Called patient back and triaged her, she describes pain as a cramping feeling in her lower abdomen, on a scale of 1-10 she rates pain as  8 and states that it is constant. Patient reports that 4 pregnancy test at home had came back negative but had concerns that she might be pregnant because she is experiencing symptoms of nausea and breast tenderness ( patient is breastfeeding). Patient reports that she has noticed hair loss within the past week. I reviewed over triage note with Marcelino Duster who advised patient be seen in person, patient declined going to ED I advised office visit today, she is scheduled at 3:40PM with Dr. Beryle Flock .Per Marcelino Duster  Patient needs to got to [facility] to have STAT labs drawn prior to appointment this afternoon patient is in agreeance. KW

## 2019-11-10 NOTE — Assessment & Plan Note (Signed)
New problem since c-section 06/2019.  Pelvic exam normal today.   Reviewed labs completed at the hospital today. Likely secondary to constipation.  Advised pt to drink plenty of water especially while breast feeding. Also may take Miralax daily.  Advised to call back if not improved or worsening symptoms.

## 2019-11-10 NOTE — Assessment & Plan Note (Signed)
Advised pt this is very normal postpartum.  Reassurance provided.

## 2019-11-10 NOTE — Progress Notes (Signed)
-    Established patient visit   Patient: April Davenport   DOB: 04/10/1991   28 y.o. Female  MRN: [id number] Visit Date: 11/10/2019  Today's healthcare provider: Shirlee Latch, MD   Chief Complaint  Patient presents with   Abdominal Pain   Alopecia    especially in the last few days.   Subjective    Abdominal Pain This is a chronic problem. The problem has been gradually worsening (Since her C-section ). The pain is located in the generalized abdominal region. The quality of the pain is cramping. The abdominal pain does not radiate. Associated symptoms include nausea and vomiting. Pertinent negatives include no constipation, diarrhea, fever or headaches. Nothing aggravates the pain. The pain is relieved by nothing. She has tried nothing for the symptoms.     Patient Active Problem List   Diagnosis Date Noted   History of bilateral tubal ligation- 06/30/19 11/10/2019   Abdominal cramping 11/10/2019   Lactational amenorrhea 11/10/2019   Telogen effluvium 11/10/2019   Social anxiety disorder 10/20/2019   Generalized anxiety disorder 10/06/2019   Postpartum depression 10/06/2019   History of hyperthyroidism 09/13/2019   Breast feeding status of mother 09/13/2019   Vitamin D deficiency 09/13/2019   Stress headaches 09/13/2019   Prediabetes 09/13/2019   Acute non-recurrent frontal sinusitis 09/13/2019   Seasonal allergies 09/13/2019   Anemia 09/13/2019   Acute serous otitis media of left ear 09/13/2019   Low TSH level 12/13/2018   History of pre-eclampsia 12/09/2018   Maternal morbid obesity, antepartum (HCC) 12/09/2018   History of kidney stones 03/18/2018   Obesity, Class III, BMI 40-49.9 (morbid obesity) (HCC) 03/18/2018   IUD (intrauterine device) in place 08/19/2013   Hypertension 06/27/2013   Birth control counseling 06/27/2013   Postpartum care following cesarean delivery 06/27/2013   Cesarean delivery delivered 05/30/2013   Preterm  delivery, delivered 05/30/2013   Antepartum mild preeclampsia 05/15/2013   Encounter for supervision of normal pregnancy in multigravida 01/20/2013   Family history of diabetes mellitus type II 01/20/2013   Body mass index (BMI) of 36.0-36.9 in adult 12/15/2012   LGSIL (low grade squamous intraepithelial lesion) on Pap smear 01/05/2012   Cervical high risk human papillomavirus (HPV) DNA test positive 11/20/2011   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   Past Surgical History:  Procedure Laterality Date   CESAREAN SECTION  05/16/2013   CESAREAN SECTION WITH BILATERAL TUBAL LIGATION Bilateral 06/30/2019   Procedure: CESAREAN SECTION WITH BILATERAL TUBAL LIGATION;  Surgeon: Tereso Newcomer, MD;  Location: [facility];  Service: Obstetrics;  Laterality: Bilateral;   KNEE SURGERY Right    Social History   Tobacco Use   Smoking status: Never Smoker   Smokeless tobacco: Never Used  Substance Use Topics   Alcohol use: Not Currently    Comment: rare   Drug use: Never   Allergies  Allergen Reactions   Dilaudid [Hydromorphone Hcl] Hives, Shortness Of Breath and Itching    Syncope   Black Walnut Flavor Hives and Swelling   Coconut Flavor Swelling     Medications: Outpatient Medications Prior to Visit  Medication Sig   fluticasone (FLONASE ALLERGY RELIEF) 50 MCG/ACT nasal spray Place 2 sprays into both nostrils daily.   [DISCONTINUED] cholecalciferol (VITAMIN D) 25 MCG (1000 UNIT) tablet Take 1 tablet (1,000 Units total) by mouth daily. (Patient not taking: Reported on 11/10/2019)   [DISCONTINUED] Multiple Vitamin (MULTIVITAMIN) capsule Take 1 capsule by mouth daily. (Patient not taking: Reported on 11/10/2019)   No  facility-administered medications prior to visit.    Review of Systems  Constitutional: Positive for fatigue. Negative for activity change, appetite change, chills, diaphoresis, fever and unexpected weight change.  Gastrointestinal: Positive  for abdominal pain, nausea and vomiting. Negative for abdominal distention, anal bleeding, blood in stool, constipation, diarrhea and rectal pain.  Genitourinary: Negative.   Neurological: Negative for dizziness, light-headedness and headaches.      Objective    BP 130/90 (BP Location: Left Arm, Patient Position: Sitting, Cuff Size: Large)    Pulse 75    Temp (!) 96.2 F (35.7 C) (Temporal)    Wt 188 lb (85.3 kg)    Breastfeeding Yes    BMI 35.52 kg/m    Physical Exam Exam conducted with a chaperone present.  Constitutional:      Appearance: She is well-developed.  Cardiovascular:     Rate and Rhythm: Normal rate and regular rhythm.     Pulses: Normal pulses.     Heart sounds: Normal heart sounds.  Pulmonary:     Effort: Pulmonary effort is normal.     Breath sounds: Normal breath sounds.  Abdominal:     Tenderness: There is abdominal tenderness.  Genitourinary:    Vagina: Normal.     Cervix: Normal.  Musculoskeletal:     Right lower leg: No edema.     Left lower leg: No edema.  Skin:    General: Skin is warm and dry.  Neurological:     Mental Status: She is alert and oriented to person, place, and time. Mental status is at baseline.  Psychiatric:        Mood and Affect: Mood normal.        Behavior: Behavior normal.        Thought Content: Thought content normal.        Judgment: Judgment normal.      Results for orders placed or performed during the hospital encounter of 11/10/19  Ferritin  Result Value Ref Range   Ferritin 27 11 - 307 ng/mL  Iron and TIBC  Result Value Ref Range   Iron 43 28 - 170 ug/dL   TIBC 378 250 - 450 ug/dL   Saturation Ratios 11 10.4 - 31.8 %   UIBC 335 ug/dL  Comprehensive metabolic panel  Result Value Ref Range   Sodium 141 135 - 145 mmol/L   Potassium 3.9 3.5 - 5.1 mmol/L   Chloride 104 98 - 111 mmol/L   CO2 27 22 - 32 mmol/L   Glucose, Bld 89 70 - 99 mg/dL   BUN 19 6 - 20 mg/dL   Creatinine, Ser 0.76 0.44 - 1.00 mg/dL    Calcium 9.5 8.9 - 10.3 mg/dL   Total Protein 7.9 6.5 - 8.1 g/dL   Albumin 4.1 3.5 - 5.0 g/dL   AST 15 15 - 41 U/L   ALT 11 0 - 44 U/L   Alkaline Phosphatase 98 38 - 126 U/L   Total Bilirubin 0.8 0.3 - 1.2 mg/dL   GFR calc non Af Amer >60 >60 mL/min   GFR calc Af Amer >60 >60 mL/min   Anion gap 10 5 - 15  CBC with Differential/Platelet  Result Value Ref Range   WBC 7.3 4.0 - 10.5 K/uL   RBC 5.01 3.87 - 5.11 MIL/uL   Hemoglobin 12.9 12.0 - 15.0 g/dL   HCT 40.6 36.0 - 46.0 %   MCV 81.0 80.0 - 100.0 fL   MCH 25.7 (L) 26.0 - 34.0 pg  MCHC 31.8 30.0 - 36.0 g/dL   RDW 42.7 06.2 - 37.6 %   Platelets 348 150 - 400 K/uL   nRBC 0.0 0.0 - 0.2 %   Neutrophils Relative % 56 %   Neutro Abs 4.1 1.7 - 7.7 K/uL   Lymphocytes Relative 35 %   Lymphs Abs 2.5 0.7 - 4.0 K/uL   Monocytes Relative 5 %   Monocytes Absolute 0.4 0.1 - 1.0 K/uL   Eosinophils Relative 3 %   Eosinophils Absolute 0.2 0.0 - 0.5 K/uL   Basophils Relative 1 %   Basophils Absolute 0.1 0.0 - 0.1 K/uL   Immature Granulocytes 0 %   Abs Immature Granulocytes 0.01 0.00 - 0.07 K/uL  hCG, quantitative, pregnancy  Result Value Ref Range   hCG, Beta Chain, Quant, S 1 <5 mIU/mL    Assessment & Plan     Problem List Items Addressed This Visit      Musculoskeletal and Integument   Telogen effluvium    Advised pt this is very normal postpartum.  Reassurance provided.          Other   Abdominal cramping - Primary    New problem since c-section 06/2019.  Pelvic exam normal today.   Reviewed labs completed at the hospital today. Likely secondary to constipation.  Advised pt to drink plenty of water especially while breast feeding. Also may take Miralax daily.  Advised to call back if not improved or worsening symptoms.       Lactational amenorrhea    HCG Negative and s/p b/l salpingectomy Reassurance provided, this is normal postpartum and also with breast feeding.            Return if symptoms worsen or fail to  improve.      I, Shirlee Latch, MD, have reviewed all documentation for this visit. The documentation on 11/11/19 for the exam, diagnosis, procedures, and orders are all accurate and complete.   April Davenport, Marzella Schlein, MD, MPH [facility]

## 2019-11-10 NOTE — Telephone Encounter (Signed)
Patient's last menstrual period was 08/15/2019 reported to  provider at initial visit at April 2nd visit  see note.  No reported symptoms of abdominal issues at visits or with Mychart messages with me or office visit with Dr. Sherrie Mustache on ROS.   Thyroid normal one month ago,.  Anemia.  Stat HCG [facility]

## 2019-11-10 NOTE — Assessment & Plan Note (Addendum)
HCG Negative and s/p b/l salpingectomy Reassurance provided, this is normal postpartum and also with breast feeding.

## 2019-11-17 ENCOUNTER — Ambulatory Visit: Payer: Federal, State, Local not specified - PPO | Admitting: Adult Health

## 2019-12-30 ENCOUNTER — Encounter: Payer: Self-pay | Admitting: Adult Health

## 2020-01-05 NOTE — Progress Notes (Signed)
Established patient visit  I,April Miller,acting as a scribe for Pacific MutualMichelle Smith Rockford Leinen, FNP.,have documented all relevant documentation on the behalf of Jairo BenMichelle Smith Shimon Trowbridge, FNP,as directed by  Jairo BenMichelle Smith Manasvini Whatley, FNP while in the presence of Jairo BenMichelle Smith Millisa Giarrusso, FNP.   Patient: April Davenport   DOB: [birth year]   28 y.o. Female  MRN: [id number] Visit Date: 01/06/2020  Today's healthcare provider: Jairo BenMichelle Smith Nyimah Shadduck, FNP   Chief Complaint  Patient presents with   Follow-up   Subjective    HPI  Anxiety, Follow-up  She was last seen for anxiety 3 months ago. Changes made at last visit include none patient is lactating and stated that she was going to continue counseling. She reports good compliance with treatment. She reports good tolerance of treatment. She is not having side effects. none  She feels her anxiety is mild and Improved since last visit.  She reports she is doing very well with increased exercise and being home with her son who is now 206 months old.  Feels well and is still doing counseling.  She denies need for psychiatric care at this time but she does have an appointment as referred with a psychiatrist coming up in the next month she reports.  She denies any suicidal or homicidal intents or thoughts.  Does not desire to take medication she wants to try to continue lifestyle management.  Denies any recent illnesses.  She is feeling well with therapy and increased exercise. Doing couples therapy for relationship trying to get that set up.   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 She works with [employer]. She was denied to be able to work at home.  She is breastfeeding at night.  Symptoms: No chest pain No difficulty concentrating  No dizziness Yes fatigue  No feelings of losing control Yes insomnia  Yes irritable No palpitations  No panic attacks Yes racing thoughts    No shortness of breath No sweating  No tremors/shakes    GAD-7 Results GAD-7 Generalized Anxiety Disorder Screening Tool 01/06/2020 10/06/2019 09/13/2019  1. Feeling Nervous, Anxious, or on Edge 1 2 3   2. Not Being Able to Stop or Control Worrying 0 1 2  3. Worrying Too Much About Different Things 1 2 2   4. Trouble Relaxing 1 1 1   5. Being So Restless it's Hard To Sit Still 2 1 0  6. Becoming Easily Annoyed or Irritable 1 2 1   7. Feeling Afraid As If Something Awful Might Happen 0 2 2  Total GAD-7 Score 6 11 11   Difficulty At Work, Home, or Getting  Along With Others? Not difficult at all Somewhat difficult Somewhat difficult    PHQ-9 Scores PHQ9 SCORE ONLY 01/06/2020 09/13/2019 03/18/2018  PHQ-9 Total Score 7 16 0    --------------------------------------------------------------------  Patient Active Problem List   Diagnosis Date Noted   Surgical history of tubal ligation 11/10/2019   Abdominal cramping 11/10/2019   Lactational amenorrhea 11/10/2019   Telogen effluvium 11/10/2019   Social anxiety disorder 10/20/2019   Generalized anxiety disorder 10/06/2019   Postpartum depression 10/06/2019   History of hyperthyroidism 09/13/2019   Breast feeding status of mother 09/13/2019   Vitamin D deficiency 09/13/2019   Stress headaches 09/13/2019   Prediabetes 09/13/2019   Acute non-recurrent frontal sinusitis 09/13/2019   Seasonal allergies 09/13/2019   Anemia 09/13/2019   Acute serous otitis media of left ear 09/13/2019  Low TSH level 12/13/2018   History of pre-eclampsia 12/09/2018   Maternal morbid obesity, antepartum (HCC) 12/09/2018   History of kidney stones 03/18/2018   Obesity, Class III, BMI 40-49.9 (morbid obesity) (HCC) 03/18/2018   IUD (intrauterine device) in place 08/19/2013   Hypertension 06/27/2013   Birth control counseling 06/27/2013   Postpartum care following cesarean delivery 06/27/2013   Cesarean delivery delivered 05/30/2013    Preterm delivery, delivered 05/30/2013   Antepartum mild preeclampsia 05/15/2013   Encounter for supervision of normal pregnancy in multigravida 01/20/2013   Family history of diabetes mellitus type II 01/20/2013   BMI 36.0-36.9,adult 12/15/2012   LGSIL (low grade squamous intraepithelial lesion) on Pap smear 01/05/2012   Cervical high risk human papillomavirus (HPV) DNA test positive 11/20/2011   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   Past Medical History:  Diagnosis Date   Allergy    Anemia    Anxiety    Depression    Hypertension    Lactating mother    Right nephrolithiasis    Past Surgical History:  Procedure Laterality Date   CESAREAN SECTION  05/16/2013   CESAREAN SECTION WITH BILATERAL TUBAL LIGATION Bilateral 06/30/2019   Procedure: CESAREAN SECTION WITH BILATERAL TUBAL LIGATION;  Surgeon: Tereso Newcomer, MD;  Location: [facility];  Service: Obstetrics;  Laterality: Bilateral;   KNEE SURGERY Right    Social History   Tobacco Use   Smoking status: Never Smoker   Smokeless tobacco: Never Used  Vaping Use   Vaping Use: Never used  Substance Use Topics   Alcohol use: Not Currently    Comment: rare   Drug use: Never   Social History   Socioeconomic History   Marital status: Single    Spouse name: Not on file   Number of children: Not on file   Years of education: Not on file   Highest education level: Not on file  Occupational History   Not on file  Tobacco Use   Smoking status: Never Smoker   Smokeless tobacco: Never Used  Vaping Use   Vaping Use: Never used  Substance and Sexual Activity   Alcohol use: Not Currently    Comment: rare   Drug use: Never   Sexual activity: Yes    Birth control/protection: None, Surgical    Comment: tubal ligation   Other Topics Concern   Not on file  Social History Narrative   Not on file   Social Determinants of Health   Financial Resource Strain:    Difficulty  of Paying Living Expenses:   Food Insecurity:    Worried About Programme researcher, broadcasting/film/video in the Last Year:    Barista in the Last Year:   Transportation Needs:    Freight forwarder (Medical):    Lack of Transportation (Non-Medical):   Physical Activity:    Days of Exercise per Week:    Minutes of Exercise per Session:   Stress:    Feeling of Stress :   Social Connections:    Frequency of Communication with Friends and Family:    Frequency of Social Gatherings with Friends and Family:    Attends Religious Services:    Active Member of Clubs or Organizations:    Attends Engineer, structural:    Marital Status:   Intimate Partner Violence:    Fear of Current or Ex-Partner:    Emotionally Abused:    Physically Abused:    Sexually Abused:    Family Status  Relation Name Status   Mother  Alive   Father  Deceased   MGM  Deceased   MGF  Deceased   PGM  Alive   PGF  Deceased   Daughter  (Not Specified)   Mat Aunt  (Not Specified)   Family History  Problem Relation Age of Onset   Diabetes Mother    Hypertension Mother    Heart attack Mother    Heart attack Father    Hypertension Father    Diabetes Father    Stroke Father    Stroke Maternal Grandfather    Diabetes Paternal Grandfather    Asthma Daughter    Sickle cell anemia Maternal Aunt    Heart Problems Maternal Aunt    Allergies  Allergen Reactions   Dilaudid [Hydromorphone Hcl] Hives, Shortness Of Breath and Itching    Syncope   Black Walnut Flavor Hives and Swelling   Coconut Flavor Swelling       Medications: Outpatient Medications Prior to Visit  Medication Sig   fluticasone (FLONASE ALLERGY RELIEF) 50 MCG/ACT nasal spray Place 2 sprays into both nostrils daily.   No facility-administered medications prior to visit.    Review of Systems  Last CBC Lab Results  Component Value Date   WBC 7.3 11/10/2019   HGB 12.9 11/10/2019   HCT 40.6  11/10/2019   MCV 81.0 11/10/2019   MCH 25.7 (L) 11/10/2019   RDW 13.6 11/10/2019   PLT 348 11/10/2019   Last metabolic panel Lab Results  Component Value Date   GLUCOSE 89 11/10/2019   NA 141 11/10/2019   K 3.9 11/10/2019   CL 104 11/10/2019   CO2 27 11/10/2019   BUN 19 11/10/2019   CREATININE 0.76 11/10/2019   GFRNONAA >60 11/10/2019   GFRAA >60 11/10/2019   CALCIUM 9.5 11/10/2019   PROT 7.9 11/10/2019   ALBUMIN 4.1 11/10/2019   LABGLOB 2.8 09/14/2019   AGRATIO 1.6 09/14/2019   BILITOT 0.8 11/10/2019   ALKPHOS 98 11/10/2019   AST 15 11/10/2019   ALT 11 11/10/2019   ANIONGAP 10 11/10/2019   Last lipids Lab Results  Component Value Date   CHOL 210 (H) 09/14/2019   HDL 68 09/14/2019   LDLCALC 132 (H) 09/14/2019   TRIG 55 09/14/2019   Last hemoglobin A1c Lab Results  Component Value Date   HGBA1C 5.4 09/14/2019   Last thyroid functions Lab Results  Component Value Date   TSH 0.927 09/14/2019   T4TOTAL 6.9 09/14/2019   Last vitamin D Lab Results  Component Value Date   VD25OH 14.4 (L) 09/14/2019   Last vitamin B12 and Folate No results found for: VITAMINB12, FOLATE    Objective    BP (!) 126/86 (BP Location: Left Arm, Patient Position: Sitting, Cuff Size: Large)    Pulse 72    Temp 98.2 F (36.8 C) (Oral)    Resp 14    Ht  (1.549 m)    Wt 193 lb (87.5 kg)    SpO2 98%    BMI 36.47 kg/m  BP Readings from Last 3 Encounters:  01/06/20 (!) 126/86  11/10/19 130/90  10/06/19 110/78      Physical Exam Vitals reviewed.  Constitutional:      General: She is not in acute distress.    Appearance: She is well-developed. She is not diaphoretic.     Interventions: She is not intubated. HENT:     Head: Normocephalic and atraumatic.     Right Ear: External ear normal.  Left Ear: External ear normal.     Nose: Nose normal.     Mouth/Throat:     Pharynx: No oropharyngeal exudate.  Eyes:     General: Lids are normal. No scleral icterus.       Right  eye: No discharge.        Left eye: No discharge.     Conjunctiva/sclera: Conjunctivae normal.     Right eye: Right conjunctiva is not injected. No exudate or hemorrhage.    Left eye: Left conjunctiva is not injected. No exudate or hemorrhage.    Pupils: Pupils are equal, round, and reactive to light.  Neck:     Thyroid: No thyroid mass or thyromegaly.     Vascular: Normal carotid pulses. No carotid bruit, hepatojugular reflux or JVD.     Trachea: Trachea and phonation normal. No tracheal tenderness or tracheal deviation.     Meningeal: Brudzinski's sign and Kernig's sign absent.  Cardiovascular:     Rate and Rhythm: Normal rate and regular rhythm.     Pulses: Normal pulses.          Radial pulses are 2+ on the right side and 2+ on the left side.       Dorsalis pedis pulses are 2+ on the right side and 2+ on the left side.       Posterior tibial pulses are 2+ on the right side and 2+ on the left side.     Heart sounds: Normal heart sounds, S1 normal and S2 normal. Heart sounds not distant. No murmur heard.  No friction rub. No gallop.   Pulmonary:     Effort: Pulmonary effort is normal. No tachypnea, bradypnea, accessory muscle usage or respiratory distress. She is not intubated.     Breath sounds: Normal breath sounds. No stridor. No wheezing or rales.  Chest:     Chest wall: No tenderness.  Abdominal:     General: Bowel sounds are normal. There is no distension or abdominal bruit.     Palpations: Abdomen is soft. There is no shifting dullness, fluid wave, hepatomegaly, splenomegaly, mass or pulsatile mass.     Tenderness: There is no abdominal tenderness. There is no guarding or rebound.     Hernia: No hernia is present.  Musculoskeletal:        General: No tenderness or deformity. Normal range of motion.     Cervical back: Full passive range of motion without pain, normal range of motion and neck supple. No edema, erythema or rigidity. No spinous process tenderness or muscular  tenderness. Normal range of motion.  Lymphadenopathy:     Head:     Right side of head: No submental, submandibular, tonsillar, preauricular, posterior auricular or occipital adenopathy.     Left side of head: No submental, submandibular, tonsillar, preauricular, posterior auricular or occipital adenopathy.     Cervical: No cervical adenopathy.     Right cervical: No superficial, deep or posterior cervical adenopathy.    Left cervical: No superficial, deep or posterior cervical adenopathy.     Upper Body:     Right upper body: No supraclavicular or pectoral adenopathy.     Left upper body: No supraclavicular or pectoral adenopathy.  Skin:    General: Skin is warm and dry.     Coloration: Skin is not pale.     Findings: No abrasion, bruising, burn, ecchymosis, erythema, lesion, petechiae or rash.     Nails: There is no clubbing.  Neurological:     Mental  Status: She is alert and oriented to person, place, and time.     GCS: GCS eye subscore is 4. GCS verbal subscore is 5. GCS motor subscore is 6.     Cranial Nerves: No cranial nerve deficit.     Sensory: No sensory deficit.     Motor: No tremor, atrophy, abnormal muscle tone or seizure activity.     Coordination: Coordination normal.     Gait: Gait normal.     Deep Tendon Reflexes: Reflexes are normal and symmetric. Reflexes normal. Babinski sign absent on the right side. Babinski sign absent on the left side.     Reflex Scores:      Tricep reflexes are 2+ on the right side and 2+ on the left side.      Bicep reflexes are 2+ on the right side and 2+ on the left side.      Brachioradialis reflexes are 2+ on the right side and 2+ on the left side.      Patellar reflexes are 2+ on the right side and 2+ on the left side.      Achilles reflexes are 2+ on the right side and 2+ on the left side. Psychiatric:        Speech: Speech normal.        Behavior: Behavior normal.        Thought Content: Thought content normal.        Judgment:  Judgment normal.     Allergies  Allergen Reactions   Dilaudid [Hydromorphone Hcl] Hives, Shortness Of Breath and Itching    Syncope   Black Walnut Flavor Hives and Swelling   Coconut Flavor Swelling     No results found for any visits on 01/06/20.  Assessment & Plan     Postpartum depression  BMI 36.0-36.9,adult  Vitamin D deficiency  Lactational amenorrhea  Surgical history of tubal ligation  No orders of the defined types were placed in this encounter. Recommend he keep follow-up with psychiatry coming up in the near future and continue counseling.  Reach out to me should any symptoms change or worsen at any time.  GAD-7 score is improved today at visit. Has no other concerns at this time.  She does have a follow-up with gynecology for her routine health exam here in the near future she reports. Overall she is doing much better we will schedule follow-up for 6 months and she can return sooner if needed. After visit summary was printed and given to patient.  Return in 6 months (on 07/08/2020), or if symptoms worsen or fail to improve, for at any time for any worsening symptoms, Go to Emergency room/ urgent care if worse.      Red Flags discussed. The patient was given clear instructions to go to ER or return to medical center if any red flags develop, symptoms do not improve, worsen or new problems develop. They verbalized understanding.   IBeverely Pace Lindsie Simar, FNP, have reviewed all documentation for this visit. The documentation on 01/06/20 for the exam, diagnosis, procedures, and orders are all accurate and complete.    Jairo Ben, FNP  [facility] [phone] (phone) [phone] (fax)  [facility]

## 2020-01-06 ENCOUNTER — Encounter: Payer: Self-pay | Admitting: Adult Health

## 2020-01-06 ENCOUNTER — Ambulatory Visit (INDEPENDENT_AMBULATORY_CARE_PROVIDER_SITE_OTHER): Payer: Federal, State, Local not specified - PPO | Admitting: Adult Health

## 2020-01-06 ENCOUNTER — Other Ambulatory Visit: Payer: Self-pay

## 2020-01-06 VITALS — BP 126/86 | HR 72 | Temp 98.2°F | Resp 14 | Ht 61.0 in | Wt 193.0 lb

## 2020-01-06 DIAGNOSIS — N912 Amenorrhea, unspecified: Secondary | ICD-10-CM

## 2020-01-06 DIAGNOSIS — E559 Vitamin D deficiency, unspecified: Secondary | ICD-10-CM | POA: Diagnosis not present

## 2020-01-06 DIAGNOSIS — F53 Postpartum depression: Secondary | ICD-10-CM

## 2020-01-06 DIAGNOSIS — Z9851 Tubal ligation status: Secondary | ICD-10-CM

## 2020-01-06 DIAGNOSIS — O99345 Other mental disorders complicating the puerperium: Secondary | ICD-10-CM | POA: Diagnosis not present

## 2020-01-06 DIAGNOSIS — Z6836 Body mass index (BMI) 36.0-36.9, adult: Secondary | ICD-10-CM | POA: Diagnosis not present

## 2020-01-06 NOTE — Patient Instructions (Addendum)
Information on : Diastasis Recti  Diastasis recti is when the muscles of the abdomen (rectus abdominis muscles) become thin and separate. The result is a wider space between the right and left abdomen (abdominal) muscles. This wider space between the muscles may cause a bulge in the middle of your abdomen. You may notice this bulge when you are straining or when you sit up from a lying down position. Diastasis recti can affect men and women. It is most common among pregnant women, infants, people who are obese, and people who have had abdominal surgery. Exercise or surgical treatment may help correct it. What are the causes? Common causes of this condition include:  Pregnancy. The growing uterus puts pressure on the abdominal muscles, which causes the muscles to separate.  Obesity. Excess fat puts pressure on abdominal muscles.  Weightlifting.  Some abdomen exercises.  Advanced age.  Genetics.  Prior abdominal surgery. What increases the risk? This condition is more likely to develop in:  Women.  Newborns, especially newborns who are born early (prematurely). What are the signs or symptoms? Common symptoms of this condition include:  A bulge in the middle of the abdomen. You will notice it most when you sit up or strain.  Pain in the low back, pelvis, or hips.  Constipation.  Inability to control when you urinate (urinary incontinence).  Bloating.  Poor posture. How is this diagnosed? This condition is diagnosed with a physical exam. Your health care provider will ask you to lie flat on your back and do a crunch or half sit-up. If you have diastasis recti, a vertical bulge will appear between your abdominal muscles in the center of your abdomen. Your health care provider will measure the gap between your muscles with one of the following:  A medical device used to measure the space between two objects (caliper).  A tape measure.  CT scan.  Ultrasound.  Finger  spaces. Your health care provider will measure the space using their fingers. How is this treated? If your muscle separation is not too large, you may not need treatment. However, if you are a woman who plans to become pregnant again, you should treat this condition before your next pregnancy. Treatment may include:  Physical therapy to strengthen and tighten your abdominal muscles.  Lifestyle changes such as weight loss and exercise.  Over-the-counter pain medicines as needed.  Surgery to correct the separation. Follow these instructions at home: Activity  Return to your normal activities as told by your health care provider. Ask your health care provider what activities are safe for you.  When lifting weights or doing exercises using your abdominal muscles or the muscles in the center of your body that give stability (core muscles), make sure you are doing your exercises and movements correctly. Proper form can help to prevent the condition from happening again. General instructions  If you are overweight, ask your health care provider for help with weight loss. Losing even a small amount of weight can help to improve your diastasis recti.  Take over-the-counter or prescription medicines only as told by your health care provider.  Do not strain. Straining can make the separation worse. Examples of straining include: ? Pushing hard to have a bowel movement, such as due to constipation. ? Lifting heavy objects, including children. ? Standing up and sitting down.  Take steps to prevent constipation: ? Drink enough fluid to keep your urine clear or pale yellow. ? Take over-the-counter or prescription medicines only as directed. ?  Eat foods that are high in fiber, such as fresh fruits and vegetables, whole grains, and beans. ? Limit foods that are high in fat and processed sugars, such as fried and sweet foods. Contact a health care provider if:  You notice a new bulge in your  abdomen. Get help right away if:  You experience severe discomfort in your abdomen.  You develop severe abdominal pain along with nausea, vomiting, or fever. Summary  Diastasis recti is when the abdomen (abdominal) muscles become thin and separate. Your abdomen will stick out because the space between your right and left abdomen muscles has widened.  The most common symptom is a bulge in your abdomen. You will notice it most when you sit up or are straining.  This condition is diagnosed during a physical exam.  If the abdomen separation is not too big, you may choose not to have treatment. Otherwise, you may need to undergo physical therapy or surgery. This information is not intended to replace advice given to you by your health care provider. Make sure you discuss any questions you have with your health care provider. Document Revised: 05/08/2017 Document Reviewed: 07/21/2016 Elsevier Patient Education  2020 [employer]. Health Maintenance, Female Adopting a healthy lifestyle and getting preventive care are important in promoting health and wellness. Ask your health care provider about:  The right schedule for you to have regular tests and exams.  Things you can do on your own to prevent diseases and keep yourself healthy. What should I know about diet, weight, and exercise? Eat a healthy diet   Eat a diet that includes plenty of vegetables, fruits, low-fat dairy products, and lean protein.  Do not eat a lot of foods that are high in solid fats, added sugars, or sodium. Maintain a healthy weight Body mass index (BMI) is used to identify weight problems. It estimates body fat based on height and weight. Your health care provider can help determine your BMI and help you achieve or maintain a healthy weight. Get regular exercise Get regular exercise. This is one of the most important things you can do for your health. Most adults should:  Exercise for at least 150 minutes each  week. The exercise should increase your heart rate and make you sweat (moderate-intensity exercise).  Do strengthening exercises at least twice a week. This is in addition to the moderate-intensity exercise.  Spend less time sitting. Even light physical activity can be beneficial. Watch cholesterol and blood lipids Have your blood tested for lipids and cholesterol at 31 years of age, then have this test every 5 years. Have your cholesterol levels checked more often if:  Your lipid or cholesterol levels are high.  You are older than 31 years of age.  You are at high risk for heart disease. What should I know about cancer screening? Depending on your health history and family history, you may need to have cancer screening at various ages. This may include screening for:  Breast cancer.  Cervical cancer.  Colorectal cancer.  Skin cancer.  Lung cancer. What should I know about heart disease, diabetes, and high blood pressure? Blood pressure and heart disease  High blood pressure causes heart disease and increases the risk of stroke. This is more likely to develop in people who have high blood pressure readings, are of African descent, or are overweight.  Have your blood pressure checked: ? Every 3-5 years if you are 26-58 years of age. ? Every year if you are 28  years old or older. Diabetes Have regular diabetes screenings. This checks your fasting blood sugar level. Have the screening done:  Once every three years after age 72 if you are at a normal weight and have a low risk for diabetes.  More often and at a younger age if you are overweight or have a high risk for diabetes. What should I know about preventing infection? Hepatitis B If you have a higher risk for hepatitis B, you should be screened for this virus. Talk with your health care provider to find out if you are at risk for hepatitis B infection. Hepatitis C Testing is recommended for:  Everyone born from [birth year]  through 1967.  Anyone with known risk factors for hepatitis C. Sexually transmitted infections (STIs)  Get screened for STIs, including gonorrhea and chlamydia, if: ? You are sexually active and are younger than 31 years of age. ? You are older than 31 years of age and your health care provider tells you that you are at risk for this type of infection. ? Your sexual activity has changed since you were last screened, and you are at increased risk for chlamydia or gonorrhea. Ask your health care provider if you are at risk.  Ask your health care provider about whether you are at high risk for HIV. Your health care provider may recommend a prescription medicine to help prevent HIV infection. If you choose to take medicine to prevent HIV, you should first get tested for HIV. You should then be tested every 3 months for as long as you are taking the medicine. Pregnancy  If you are about to stop having your period (premenopausal) and you may become pregnant, seek counseling before you get pregnant.  Take 400 to 800 micrograms (mcg) of folic acid every day if you become pregnant.  Ask for birth control (contraception) if you want to prevent pregnancy. Osteoporosis and menopause Osteoporosis is a disease in which the bones lose minerals and strength with aging. This can result in bone fractures. If you are 59 years old or older, or if you are at risk for osteoporosis and fractures, ask your health care provider if you should:  Be screened for bone loss.  Take a calcium or vitamin D supplement to lower your risk of fractures.  Be given hormone replacement therapy (HRT) to treat symptoms of menopause. Follow these instructions at home: Lifestyle  Do not use any products that contain nicotine or tobacco, such as cigarettes, e-cigarettes, and chewing tobacco. If you need help quitting, ask your health care provider.  Do not use street drugs.  Do not share needles.  Ask your health care  provider for help if you need support or information about quitting drugs. Alcohol use  Do not drink alcohol if: ? Your health care provider tells you not to drink. ? You are pregnant, may be pregnant, or are planning to become pregnant.  If you drink alcohol: ? Limit how much you use to 0-1 drink a day. ? Limit intake if you are breastfeeding.  Be aware of how much alcohol is in your drink. In the [place] one drink equals one 12 oz bottle of beer (355 mL), one 5 oz glass of wine (148 mL), or one 1 oz glass of hard liquor (44 mL). General instructions  Schedule regular health, dental, and eye exams.  Stay current with your vaccines.  Tell your health care provider if: ? You often feel depressed. ? You have ever been abused or  do not feel safe at home. Summary  Adopting a healthy lifestyle and getting preventive care are important in promoting health and wellness.  Follow your health care provider's instructions about healthy diet, exercising, and getting tested or screened for diseases.  Follow your health care provider's instructions on monitoring your cholesterol and blood pressure. This information is not intended to replace advice given to you by your health care provider. Make sure you discuss any questions you have with your health care provider. Document Revised: 05/19/2018 Document Reviewed: 05/19/2018 Elsevier Patient Education  2020 [employer]   Psychiatric/Counseling Resources Discussed As Follows:  If Emergency please seek Emergency Room Care Immediately or Call 911.   [facility] Address:  [address] [place], [place] [address] Phone: [phone] Website : [email or web address]   [facility] Address:  [address] [place], [place] [address] Phone: [phone] Fax: [phone] Website: [email or web address] How To Access Our Services Because our main goal is to meet the needs of our consumers, [facility]  operates on a walk-in basis! To access services, there are just 3 easy steps: 1) Walk in any Monday, Wednesday or Friday between 8:00 am and 3:00 pm and complete our consumer paperwork 2) A Comprehensive Clinical Assessment (CCA) will be completed and appropriate service recommendations will be provided 3) Recommendations are sent to [facility] team members and the appropriate staff will call you within days. Advanced Access Open M - F, 8:00 am - 8:00 pm  Mental health crisis services for all age groups  Triage  Psychiatric Evaluations  Involuntary Commitments  [facility]  Address: [address] [place], [place] [address] Website : [email or web address] Walk in's accepted see web site or call for more information Phone : [phone] Also has [facility] [phone]    Psychology Today Find a therapist by searching online in your area or specialist by your diagnosis Website:  [email or web address]

## 2020-02-06 ENCOUNTER — Encounter: Payer: Self-pay | Admitting: Adult Health

## 2020-02-15 ENCOUNTER — Telehealth: Payer: Self-pay | Admitting: Adult Health

## 2020-02-15 NOTE — Telephone Encounter (Signed)
Yes I can fill out based off last form michelle completed for her

## 2020-02-15 NOTE — Telephone Encounter (Signed)
Copied from CRM [id number]. Topic: General - Other >> Feb 15, 2020  8:21 AM Tamela Oddi wrote: Reason for CRM: Patient called to get a reasonable accommodation form filled out for her employer.  She stated that Dr. Ashok Norris has done it before, but since she is out, she will need another doctor to fill it out.  Please advise and call patient to discuss at [phone]

## 2020-02-16 NOTE — Telephone Encounter (Signed)
Have not seen this form 

## 2020-02-17 NOTE — Telephone Encounter (Signed)
Patient reports she is not needing the form filled for now. Patient reports that her request to work from home was denied.

## 2020-02-20 NOTE — Telephone Encounter (Signed)
We'll need a new form, but we can fill out with similar info and updated dates.  Will need to know if she has a date in mind to return as well.

## 2020-02-21 ENCOUNTER — Telehealth: Payer: Self-pay

## 2020-02-21 NOTE — Telephone Encounter (Signed)
Copied from CRM [id number]. Topic: General - Other >> Feb 21, 2020 10:31 AM Dalphine Handing A wrote: Patient called to see if Dr. B could add the details she mentioned in her mychart message from yesterday morning in regards to therapy and going back to work in January in the documentation that patient sent via mychart that was requested. Patient requesting callback once paperwork has been completed.

## 2020-02-23 NOTE — Telephone Encounter (Signed)
Form completed.  Will give to [facility] to submit.

## 2020-02-24 NOTE — Telephone Encounter (Signed)
This is in regards to the forms I completed yesterday.  Can you let patient know where they were submitted?  Thanks!

## 2020-02-27 NOTE — Telephone Encounter (Signed)
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

## 2020-02-27 NOTE — Telephone Encounter (Signed)
Pt needs this done today to get paid for work. / please advise

## 2020-02-27 NOTE — Telephone Encounter (Signed)
Please review. Thanks!  

## 2020-02-27 NOTE — Telephone Encounter (Signed)
Forms updated and can be resubmitted.

## 2020-03-13 ENCOUNTER — Encounter: Payer: Self-pay | Admitting: Adult Health

## 2020-03-13 DIAGNOSIS — Z20822 Contact with and (suspected) exposure to covid-19: Secondary | ICD-10-CM | POA: Diagnosis not present

## 2020-03-19 ENCOUNTER — Ambulatory Visit (INDEPENDENT_AMBULATORY_CARE_PROVIDER_SITE_OTHER): Payer: Federal, State, Local not specified - PPO | Admitting: Adult Health

## 2020-03-19 DIAGNOSIS — Z5329 Procedure and treatment not carried out because of patient's decision for other reasons: Secondary | ICD-10-CM

## 2020-03-19 NOTE — Progress Notes (Signed)
NO SHOW FOR APPOINTMENT

## 2020-03-20 ENCOUNTER — Ambulatory Visit (INDEPENDENT_AMBULATORY_CARE_PROVIDER_SITE_OTHER): Payer: Federal, State, Local not specified - PPO | Admitting: Adult Health

## 2020-03-20 ENCOUNTER — Encounter: Payer: Self-pay | Admitting: Adult Health

## 2020-03-20 ENCOUNTER — Other Ambulatory Visit: Payer: Self-pay

## 2020-03-20 VITALS — BP 123/89 | HR 78 | Temp 98.3°F | Resp 16 | Wt 197.2 lb

## 2020-03-20 DIAGNOSIS — O99345 Other mental disorders complicating the puerperium: Secondary | ICD-10-CM

## 2020-03-20 DIAGNOSIS — J069 Acute upper respiratory infection, unspecified: Secondary | ICD-10-CM | POA: Diagnosis not present

## 2020-03-20 DIAGNOSIS — L650 Telogen effluvium: Secondary | ICD-10-CM

## 2020-03-20 DIAGNOSIS — O10919 Unspecified pre-existing hypertension complicating pregnancy, unspecified trimester: Secondary | ICD-10-CM

## 2020-03-20 DIAGNOSIS — O0993 Supervision of high risk pregnancy, unspecified, third trimester: Secondary | ICD-10-CM

## 2020-03-20 DIAGNOSIS — F419 Anxiety disorder, unspecified: Secondary | ICD-10-CM | POA: Diagnosis not present

## 2020-03-20 DIAGNOSIS — F53 Postpartum depression: Secondary | ICD-10-CM

## 2020-03-20 DIAGNOSIS — Z0000 Encounter for general adult medical examination without abnormal findings: Secondary | ICD-10-CM

## 2020-03-20 MED ORDER — CYCLOBENZAPRINE HCL 5 MG PO TABS: 5.0000 mg | ORAL_TABLET | Freq: Every day | ORAL | 0 refills | Status: DC

## 2020-03-20 NOTE — Patient Instructions (Addendum)
Persistent Depressive Disorder  Persistent depressive disorder (PDD) is a mental health condition. PDD causes symptoms of low-level depression for 2 years or longer. It may also be called long-term (chronic) depression or dysthymia. PDD may include episodes of more severe depression that last for about 2 weeks (major depressive disorder or MDD). PDD can affect the way you think, feel, and sleep. This condition may also affect your relationships. You may be more likely to get sick if you have PDD. Symptoms of PDD occur for most of the day and may include:  Feeling tired (fatigue).  Low energy.  Eating too much or too little.  Sleeping too much or too little.  Feeling restless or agitated.  Feeling hopeless.  Feeling worthless or guilty.  Feeling worried or nervous (anxiety).  Trouble concentrating or making decisions.  Low self-esteem.  A negative way of looking at things (outlook).  Not being able to have fun or feel pleasure.  Avoiding interacting with people.  Getting angry or annoyed easily (irritability).  Acting aggressive or angry. Follow these instructions at home: Activity  Go back to your normal activities as told by your doctor.  Exercise regularly as told by your doctor. General instructions  Take over-the-counter and prescription medicines only as told by your doctor.  Do not drink alcohol. Or, limit how much alcohol you drink to no more than 1 drink a day for nonpregnant women and 2 drinks a day for men. One drink equals 12 oz of beer, 5 oz of wine, or 1 oz of hard liquor. Alcohol can affect any antidepressant medicines you are taking. Talk with your doctor about your alcohol use.  Eat a healthy diet and get plenty of sleep.  Find activities that you enjoy each day.  Consider joining a support group. Your doctor may be able to suggest a support group.  Keep all follow-up visits as told by your doctor. This is important. Where to find more  information [employer] on Mental Illness  www.nami.org [place] [employer] of Mental Health  http://www.maynard.net/ National Suicide Prevention Lifeline  (848)858-7419).  This is free, 24-hour help. Contact a doctor if:  Your symptoms get worse.  You have new symptoms.  You have trouble sleeping or doing your daily activities. Get help right away if:  You self-harm.  You have serious thoughts about hurting yourself or others.  You see, hear, taste, smell, or feel things that are not there (hallucinate). This information is not intended to replace advice given to you by your health care provider. Make sure you discuss any questions you have with your health care provider. Document Revised: 05/08/2017 Document Reviewed: 01/18/2016 Elsevier Patient Education  2020 [employer]. Postpartum Baby Blues The postpartum period begins right after the birth of a baby. During this time, there is often a lot of joy and excitement. It is also a time of many changes in the life of the parents. No matter how many times a mother gives birth, each child brings new challenges to the family, including different ways of relating to one another. It is common to have feelings of excitement along with confusing changes in moods, emotions, and thoughts. You may feel happy one minute and sad or stressed the next. These feelings of sadness usually happen in the period right after you have your baby, and they go away within a week or two. This is called the "baby blues." What are the causes? There is no known cause of baby blues. It is likely  caused by a combination of factors. However, changes in hormone levels after childbirth are believed to trigger some of the symptoms. Other factors that can play a role in these mood changes include:  Lack of sleep.  Stressful life events, such as poverty, caring for a loved one, or death of a loved one.  Genetics. What are the signs or symptoms? Symptoms  of this condition include:  Brief changes in mood, such as going from extreme happiness to sadness.  Decreased concentration.  Difficulty sleeping.  Crying spells and tearfulness.  Loss of appetite.  Irritability.  Anxiety. If the symptoms of baby blues last for more than 2 weeks or become more severe, you may have postpartum depression. How is this diagnosed? This condition is diagnosed based on an evaluation of your symptoms. There are no medical or lab tests that lead to a diagnosis, but there are various questionnaires that a health care provider may use to identify women with the baby blues or postpartum depression. How is this treated? Treatment is not needed for this condition. The baby blues usually go away on their own in 1-2 weeks. Social support is often all that is needed. You will be encouraged to get adequate sleep and rest. Follow these instructions at home: Lifestyle      Get as much rest as you can. Take a nap when the baby sleeps.  Exercise regularly as told by your health care provider. Some women find yoga and walking to be helpful.  Eat a balanced and nourishing diet. This includes plenty of fruits and vegetables, whole grains, and lean proteins.  Do little things that you enjoy. Have a cup of tea, take a bubble bath, read your favorite magazine, or listen to your favorite music.  Avoid alcohol.  Ask for help with household chores, cooking, grocery shopping, or running errands. Do not try to do everything yourself. Consider hiring a postpartum doula to help. This is a professional who specializes in providing support to new mothers.  Try not to make any major life changes during pregnancy or right after giving birth. This can add stress. General instructions  Talk to people close to you about how you are feeling. Get support from your partner, family members, friends, or other new moms. You may want to join a support group.  Find ways to cope with  stress. This may include: ? Writing your thoughts and feelings in a journal. ? Spending time outside. ? Spending time with people who make you laugh.  Try to stay positive in how you think. Think about the things you are grateful for.  Take over-the-counter and prescription medicines only as told by your health care provider.  Let your health care provider know if you have any concerns.  Keep all postpartum visits as told by your health care provider. This is important. Contact a health care provider if:  Your baby blues do not go away after 2 weeks. Get help right away if:  You have thoughts of taking your own life (suicidal thoughts).  You think you may harm the baby or other people.  You see or hear things that are not there (hallucinations). Summary  After giving birth, you may feel happy one minute and sad or stressed the next. Feelings of sadness that happen right after the baby is born and go away after a week or two are called the "baby blues."  You can manage the baby blues by getting enough rest, eating a healthy diet, exercising,  spending time with supportive people, and finding ways to cope with stress.  If feelings of sadness and stress last longer than 2 weeks or get in the way of caring for your baby, talk to your health care provider. This may mean you have postpartum depression. This information is not intended to replace advice given to you by your health care provider. Make sure you discuss any questions you have with your health care provider. Document Revised: 09/17/2018 Document Reviewed: 07/22/2016 Elsevier Patient Education  2020 Else[employer] Inc. Cyclobenzaprine tablets What is this medicine? CYCLOBENZAPRINE (sye kloe BEN za preen) is a muscle relaxer. It is used to treat muscle pain, spasms, and stiffness. This medicine may be used for other purposes; ask your health care provider or pharmacist if you have questions. COMMON BRAND NAME(S): Fexmid,  Flexeril What should I tell my health care provider before I take this medicine? They need to know if you have any of these conditions:  heart disease, irregular heartbeat, or previous heart attack  liver disease  thyroid problem  an unusual or allergic reaction to cyclobenzaprine, tricyclic antidepressants, lactose, other medicines, foods, dyes, or preservatives  pregnant or trying to get pregnant  breast-feeding How should I use this medicine? Take this medicine by mouth with a glass of water. Follow the directions on the prescription label. If this medicine upsets your stomach, take it with food or milk. Take your medicine at regular intervals. Do not take it more often than directed. Talk to your pediatrician regarding the use of this medicine in children. Special care may be needed. Overdosage: If you think you have taken too much of this medicine contact a poison control center or emergency room at once. NOTE: This medicine is only for you. Do not share this medicine with others. What if I miss a dose? If you miss a dose, take it as soon as you can. If it is almost time for your next dose, take only that dose. Do not take double or extra doses. What may interact with this medicine? Do not take this medicine with any of the following medications:  MAOIs like Carbex, Eldepryl, Marplan, Nardil, and Parnate  narcotic medicines for cough  safinamide This medicine may also interact with the following medications:  alcohol  bupropion  antihistamines for allergy, cough and cold  certain medicines for anxiety or sleep  certain medicines for bladder problems like oxybutynin, tolterodine  certain medicines for depression like amitriptyline, fluoxetine, sertraline  certain medicines for Parkinson's disease like benztropine, trihexyphenidyl  certain medicines for seizures like phenobarbital, primidone  certain medicines for stomach problems like dicyclomine,  hyoscyamine  certain medicines for travel sickness like scopolamine  general anesthetics like halothane, isoflurane, methoxyflurane, propofol  ipratropium  local anesthetics like lidocaine, pramoxine, tetracaine  medicines that relax muscles for surgery  narcotic medicines for pain  phenothiazines like chlorpromazine, mesoridazine, prochlorperazine, thioridazine  verapamil This list may not describe all possible interactions. Give your health care provider a list of all the medicines, herbs, non-prescription drugs, or dietary supplements you use. Also tell them if you smoke, drink alcohol, or use illegal drugs. Some items may interact with your medicine. What should I watch for while using this medicine? Tell your doctor or health care professional if your symptoms do not start to get better or if they get worse. You may get drowsy or dizzy. Do not drive, use machinery, or do anything that needs mental alertness until you know how this medicine affects you. Do not  stand or sit up quickly, especially if you are an older patient. This reduces the risk of dizzy or fainting spells. Alcohol may interfere with the effect of this medicine. Avoid alcoholic drinks. If you are taking another medicine that also causes drowsiness, you may have more side effects. Give your health care provider a list of all medicines you use. Your doctor will tell you how much medicine to take. Do not take more medicine than directed. Call emergency for help if you have problems breathing or unusual sleepiness. Your mouth may get dry. Chewing sugarless gum or sucking hard candy, and drinking plenty of water may help. Contact your doctor if the problem does not go away or is severe. What side effects may I notice from receiving this medicine? Side effects that you should report to your doctor or health care professional as soon as possible:  allergic reactions like skin rash, itching or hives, swelling of the face, lips,  or tongue  breathing problems  chest pain  fast, irregular heartbeat  hallucinations  seizures  unusually weak or tired Side effects that usually do not require medical attention (report to your doctor or health care professional if they continue or are bothersome):  headache  nausea, vomiting This list may not describe all possible side effects. Call your doctor for medical advice about side effects. You may report side effects to FDA at 1-800-FDA-1088. Where should I keep my medicine? Keep out of the reach of children. Store at room temperature between 15 and 30 degrees C (59 and 86 degrees F). Keep container tightly closed. Throw away any unused medicine after the expiration date. NOTE: This sheet is a summary. It may not cover all possible information. If you have questions about this medicine, talk to your doctor, pharmacist, or health care provider.  2020 Elsevier/Gold Standard (2018-04-28 12:49:26)  Muscle Cramps and Spasms Muscle cramps and spasms are when muscles tighten by themselves. They usually get better within minutes. Muscle cramps are painful. They are usually stronger and last longer than muscle spasms. Muscle spasms may or may not be painful. They can last a few seconds or much longer. Cramps and spasms can affect any muscle, but they occur most often in the calf muscles of the leg. They are usually not caused by a serious problem. In many cases, the cause is not known. Some common causes include:  Doing more physical work or exercise than your body is ready for.  Using the muscles too much (overuse) by repeating certain movements too many times.  Staying in a certain position for a long time.  Playing a sport or doing an activity without preparing properly.  Using bad form or technique while playing a sport or doing an activity.  Not having enough water in your body (dehydration).  Injury.  Side effects of some medicines.  Low levels of the salts and  minerals in your blood (electrolytes), such as low potassium or calcium. Follow these instructions at home: Managing pain and stiffness      Massage, stretch, and relax the muscle. Do this for many minutes at a time.  If told, put heat on tight or tense muscles as often as told by your doctor. Use the heat source that your doctor recommends, such as a moist heat pack or a heating pad. ? Place a towel between your skin and the heat source. ? Leave the heat on for 20-30 minutes. ? Remove the heat if your skin turns bright red. This is  very important if you are not able to feel pain, heat, or cold. You may have a greater risk of getting burned.  If told, put ice on the affected area. This may help if you are sore or have pain after a cramp or spasm. ? Put ice in a plastic bag. ? Place a towel between your skin and the bag. ? Leave the ice on for 20 minutes, 2-3 times a day.  Try taking hot showers or baths to help relax tight muscles. Eating and drinking  Drink enough fluid to keep your pee (urine) pale yellow.  Eat a healthy diet to help ensure that your muscles work well. This should include: ? Fruits and vegetables. ? Lean protein. ? Whole grains. ? Low-fat or nonfat dairy products. General instructions  If you are having cramps often, avoid intense exercise for several days.  Take over-the-counter and prescription medicines only as told by your doctor.  Watch for any changes in your symptoms.  Keep all follow-up visits as told by your doctor. This is important. Contact a doctor if:  Your cramps or spasms get worse or happen more often.  Your cramps or spasms do not get better with time. Summary  Muscle cramps and spasms are when muscles tighten by themselves. They usually get better within minutes.  Cramps and spasms occur most often in the calf muscles of the leg.  Massage, stretch, and relax the muscle. This may help the cramp or spasm go away.  Drink enough  fluid to keep your pee (urine) pale yellow. This information is not intended to replace advice given to you by your health care provider. Make sure you discuss any questions you have with your health care provider. Document Revised: 10/19/2017 Document Reviewed: 10/19/2017 Elsevier Patient Education  2020 [employer].

## 2020-03-20 NOTE — Progress Notes (Signed)
Established patient visit   Patient: April Davenport   DOB: 08-Aug-1990   29 y.o. Female  MRN: [id number] Visit Date: 03/20/2020  Today's healthcare provider: Jairo Ben, FNP   Chief Complaint  Patient presents with   Follow-up   Hypertension   Subjective    HPI  Follow up for reasonable accomadations The patient was last seen for this 3 months ago. Changes made at last visit include none. Patient states today that her boss would not approve her to work from home, she is currently still out on extended maternity leave and is looking to apply for FMLA She feels that condition is Unchanged.  Patient is not sleeping well, feels she may have restless leg she has pain in both thighs at times. Does not occur nightly, she has noticed more in the past 2 weeks. Denies any injury.  She has had occasional higler reading at home on her blood pressure, denies any other associated symptoms.   She is turning in a notice to her job.   She still has occasional restless nights. She is still in counseling, she has not seen psychiatry yet. She i doing marriage  counseling and individual counseling. She feels this is helping her.  She denies any suicidal or homicidal ideations. She sees psychiatry the end of October as advised. She feels she is doing better than previously and declines the need for medication.   She is taking care of her infant with bad reflux, he is doing better she reports.   she has had a cold, nasal congestion and mucous since x 2 days. She reports she went to get covid test rapid and was negative.   She has had mild headades, mild aches in legs.   She is trying excercise and not doing as well as she likes.   Denies calf pain or cramping. Denies any edema.   Denies any loss of bowel or bladder control.  Denies saddle paresthesias.  Denies radiculopathy/ paresthesias.  Denies back pain.   Patient  denies any fever, body aches,chills, rash, chest pain,  shortness of breath, nausea, vomiting, or diarrhea.   She answered no to front desk screening questions for covid.  -----------------------------------------------------------------------------------------      Medications: Outpatient Medications Prior to Visit  Medication Sig   fluticasone (FLONASE ALLERGY RELIEF) 50 MCG/ACT nasal spray Place 2 sprays into both nostrils daily. (Patient not taking: Reported on 03/20/2020)   No facility-administered medications prior to visit.    Review of Systems  Constitutional: Negative for activity change, appetite change, chills, diaphoresis, fatigue, fever and unexpected weight change.  Respiratory: Negative.   Cardiovascular: Negative.   Gastrointestinal: Negative.   Genitourinary: Negative.   Musculoskeletal: Negative.   Skin: Negative for rash.  Neurological: Negative.   Psychiatric/Behavioral: Positive for decreased concentration. Negative for agitation, behavioral problems, confusion, dysphoric mood, hallucinations, self-injury and suicidal ideas. The patient is nervous/anxious. The patient is not hyperactive.       Objective    BP 123/89    Pulse 78    Temp 98.3 F (36.8 C) (Oral)    Resp 16    Wt 197 lb 3.2 oz (89.4 kg)    SpO2 99%    BMI 37.26 kg/m    Physical Exam Vitals reviewed.  HENT:     Head: Normocephalic and atraumatic.     Comments: defered due to respiratory symptoms and office Covid protocol, patient had on mask did not remove.     Left Ear:  External ear normal.  Eyes:     General: No scleral icterus.       Right eye: No discharge.        Left eye: No discharge.     Extraocular Movements: Extraocular movements intact.     Conjunctiva/sclera: Conjunctivae normal.     Pupils: Pupils are equal, round, and reactive to light.  Cardiovascular:     Rate and Rhythm: Normal rate and regular rhythm.     Pulses: Normal pulses.     Heart sounds: Normal heart sounds. No murmur heard.  No gallop.   Pulmonary:      Effort: Pulmonary effort is normal.     Breath sounds: Normal breath sounds.  Abdominal:     General: There is no distension.     Palpations: Abdomen is soft.     Tenderness: There is no abdominal tenderness.  Musculoskeletal:        General: Normal range of motion.     Cervical back: Normal range of motion and neck supple.  Lymphadenopathy:     Cervical: No cervical adenopathy.  Skin:    General: Skin is warm.     Capillary Refill: Capillary refill takes less than 2 seconds.  Neurological:     General: No focal deficit present.     Mental Status: Mental status is at baseline.     Cranial Nerves: No cranial nerve deficit.     Sensory: No sensory deficit.     Motor: No weakness.     Coordination: Coordination normal.     Gait: Gait normal.     Deep Tendon Reflexes: Reflexes normal.  Psychiatric:        Mood and Affect: Mood normal.        Behavior: Behavior normal.        Thought Content: Thought content normal.        Judgment: Judgment normal.      No results found for any visits on 03/20/20.  Assessment & Plan     Viral upper respiratory tract infection - Plan: COVID-19, Flu A+B and RSV, Culture, Group A Strep  Postpartum depression  Telogen effluvium  Anxiety  Hair loss improving, feels postpartum depression is mildly improved since last visit and anxiety. She sees psychiatry the end of October. She requests FMLA forms filled out until then, she understands she will need to see psychiatry and keep that appointment.   Red Flags discussed. The patient was given clear instructions to go to ER or return to medical center if any red flags develop, symptoms do not improve, worsen or new problems develop. They verbalized understanding.  Orders Placed This Encounter  Procedures   COVID-19, Flu A+B and RSV   Culture, Group A Strep  She will return to drive up testing at [address] in parking lot today wear mask and stay in car.   Return in about 1 month (around 04/20/2020),  or if symptoms worsen or fail to improve, for at any time for any worsening symptoms, Go to Emergency room/ urgent care if worse.       Jairo Ben, FNP  [facility] [phone] (phone) [phone] (fax)  [facility]

## 2020-03-21 ENCOUNTER — Encounter: Payer: Self-pay | Admitting: Adult Health

## 2020-04-20 ENCOUNTER — Ambulatory Visit: Payer: Federal, State, Local not specified - PPO | Admitting: Adult Health

## 2020-04-28 ENCOUNTER — Encounter: Payer: Self-pay | Admitting: Adult Health

## 2020-05-07 ENCOUNTER — Encounter: Payer: Self-pay | Admitting: Adult Health

## 2020-05-07 ENCOUNTER — Ambulatory Visit (INDEPENDENT_AMBULATORY_CARE_PROVIDER_SITE_OTHER): Payer: Federal, State, Local not specified - PPO | Admitting: Adult Health

## 2020-05-07 ENCOUNTER — Other Ambulatory Visit: Payer: Self-pay

## 2020-05-07 VITALS — BP 123/84 | HR 93 | Temp 98.2°F | Resp 16 | Wt 203.8 lb

## 2020-05-07 DIAGNOSIS — F419 Anxiety disorder, unspecified: Secondary | ICD-10-CM | POA: Diagnosis not present

## 2020-05-07 DIAGNOSIS — Z6838 Body mass index (BMI) 38.0-38.9, adult: Secondary | ICD-10-CM

## 2020-05-07 DIAGNOSIS — R635 Abnormal weight gain: Secondary | ICD-10-CM | POA: Diagnosis not present

## 2020-05-07 MED ORDER — NALTREXONE-BUPROPION HCL ER 8-90 MG PO TB12: ORAL_TABLET | ORAL | 0 refills | Status: DC

## 2020-05-07 NOTE — Patient Instructions (Addendum)
Mediterranean Diet A Mediterranean diet refers to food and lifestyle choices that are based on the traditions of countries located on the [employer]. This way of eating has been shown to help prevent certain conditions and improve outcomes for people who have chronic diseases, like kidney disease and heart disease. What are tips for following this plan? Lifestyle  Cook and eat meals together with your family, when possible.  Drink enough fluid to keep your urine clear or pale yellow.  Be physically active every day. This includes: ? Aerobic exercise like running or swimming. ? Leisure activities like gardening, walking, or housework.  Get 7-8 hours of sleep each night.  If recommended by your health care provider, drink red wine in moderation. This means 1 glass a day for nonpregnant women and 2 glasses a day for men. A glass of wine equals 5 oz (150 mL). Reading food labels   Check the serving size of packaged foods. For foods such as rice and pasta, the serving size refers to the amount of cooked product, not dry.  Check the total fat in packaged foods. Avoid foods that have saturated fat or trans fats.  Check the ingredients list for added sugars, such as corn syrup. Shopping  At the grocery store, buy most of your food from the areas near the walls of the store. This includes: ? Fresh fruits and vegetables (produce). ? Grains, beans, nuts, and seeds. Some of these may be available in unpackaged forms or large amounts (in bulk). ? Fresh seafood. ? Poultry and eggs. ? Low-fat dairy products.  Buy whole ingredients instead of prepackaged foods.  Buy fresh fruits and vegetables in-season from local farmers markets.  Buy frozen fruits and vegetables in resealable bags.  If you do not have access to quality fresh seafood, buy precooked frozen shrimp or canned fish, such as tuna, salmon, or sardines.  Buy small amounts of raw or cooked vegetables, salads, or olives from  the deli or salad bar at your store.  Stock your pantry so you always have certain foods on hand, such as olive oil, canned tuna, canned tomatoes, rice, pasta, and beans. Cooking  Cook foods with extra-virgin olive oil instead of using butter or other vegetable oils.  Have meat as a side dish, and have vegetables or grains as your main dish. This means having meat in small portions or adding small amounts of meat to foods like pasta or stew.  Use beans or vegetables instead of meat in common dishes like chili or lasagna.  Experiment with different cooking methods. Try roasting or broiling vegetables instead of steaming or sauteing them.  Add frozen vegetables to soups, stews, pasta, or rice.  Add nuts or seeds for added healthy fat at each meal. You can add these to yogurt, salads, or vegetable dishes.  Marinate fish or vegetables using olive oil, lemon juice, garlic, and fresh herbs. Meal planning   Plan to eat 1 vegetarian meal one day each week. Try to work up to 2 vegetarian meals, if possible.  Eat seafood 2 or more times a week.  Have healthy snacks readily available, such as: ? Vegetable sticks with hummus. ? [place] yogurt. ? Fruit and nut trail mix.  Eat balanced meals throughout the week. This includes: ? Fruit: 2-3 servings a day ? Vegetables: 4-5 servings a day ? Low-fat dairy: 2 servings a day ? Fish, poultry, or lean meat: 1 serving a day ? Beans and legumes: 2 or more servings a week ?  Nuts and seeds: 1-2 servings a day ? Whole grains: 6-8 servings a day ? Extra-virgin olive oil: 3-4 servings a day  Limit red meat and sweets to only a few servings a month What are my food choices?  Mediterranean diet ? Recommended  Grains: Whole-grain pasta. Brown rice. Bulgar wheat. Polenta. Couscous. Whole-wheat bread. Orpah Cobb.  Vegetables: Artichokes. Beets. Broccoli. Cabbage. Carrots. Eggplant. Green beans. Chard. Kale. Spinach. Onions. Leeks. Peas. Squash.  Tomatoes. Peppers. Radishes.  Fruits: Apples. Apricots. Avocado. Berries. Bananas. Cherries. Dates. Figs. Grapes. Lemons. Melon. Oranges. Peaches. Plums. Pomegranate.  Meats and other protein foods: Beans. Almonds. Sunflower seeds. Pine nuts. Peanuts. Cod. Salmon. Scallops. Shrimp. Tuna. Tilapia. Clams. Oysters. Eggs.  Dairy: Low-fat milk. Cheese. Greek yogurt.  Beverages: Water. Red wine. Herbal tea.  Fats and oils: Extra virgin olive oil. Avocado oil. Grape seed oil.  Sweets and desserts: [place] yogurt with honey. Baked apples. Poached pears. Trail mix.  Seasoning and other foods: Basil. Cilantro. Coriander. Cumin. Mint. Parsley. Sage. Rosemary. Tarragon. Garlic. Oregano. Thyme. Pepper. Balsalmic vinegar. Tahini. Hummus. Tomato sauce. Olives. Mushrooms. ? Limit these  Grains: Prepackaged pasta or rice dishes. Prepackaged cereal with added sugar.  Vegetables: Deep fried potatoes (french fries).  Fruits: Fruit canned in syrup.  Meats and other protein foods: Beef. Pork. Lamb. Poultry with skin. Hot dogs. Tomasa Blase.  Dairy: Ice cream. Sour cream. Whole milk.  Beverages: Juice. Sugar-sweetened soft drinks. Beer. Liquor and spirits.  Fats and oils: Butter. Canola oil. Vegetable oil. Beef fat (tallow). Lard.  Sweets and desserts: Cookies. Cakes. Pies. Candy.  Seasoning and other foods: Mayonnaise. Premade sauces and marinades. The items listed may not be a complete list. Talk with your dietitian about what dietary choices are right for you. Summary  The Mediterranean diet includes both food and lifestyle choices.  Eat a variety of fresh fruits and vegetables, beans, nuts, seeds, and whole grains.  Limit the amount of red meat and sweets that you eat.  Talk with your health care provider about whether it is safe for you to drink red wine in moderation. This means 1 glass a day for nonpregnant women and 2 glasses a day for men. A glass of wine equals 5 oz (150 mL). This information  is not intended to replace advice given to you by your health care provider. Make sure you discuss any questions you have with your health care provider. Document Revised: 01/24/2016 Document Reviewed: 01/17/2016 Elsevier Patient Education  2020 Elsevier Inc. Bupropion; Naltrexone extended-release tablets What is this medicine? BUPROPION; NALTREXONE (byoo PROE pee on; nal TREX one) is a combination of two drugs that help you lose weight. This product is used with a reduced calorie diet and exercise. This product can also help you maintain weight loss. This medicine may be used for other purposes; ask your health care provider or pharmacist if you have questions. COMMON BRAND NAME(S): Contrave What should I tell my health care provider before I take this medicine? They need to know if you have any of these conditions:  an eating disorder, such as anorexia or bulimia  diabetes  depression  glaucoma  head injury  heart disease  high blood pressure  history of drug abuse or alcohol abuse problem  history of a tumor or infection of your brain or spine  history of heart attack or stroke  history of irregular heartbeat  if you often drink alcohol  kidney disease  liver disease  low levels of sodium in the blood  mental  illness  seizures  suicidal thoughts, plans, or attempt; a previous suicide attempt by you or a family member  taken an MAOI like Carbex, Eldepryl, Marplan, Nardil, or Parnate in last 14 days  an unusual or allergic reaction to bupropion, naltrexone, other medicines, foods, dyes, or preservatives  breast-feeding  pregnant or trying to become pregnant How should I use this medicine? Take this medicine by mouth with a glass of water. Follow the directions on the prescription label. Do not cut, crush or chew this medicine. Swallow the tablets whole. You can take it with or without food. Do not take with high-fat meals as this may increase your risk of  seizures. Take your medicine at regular intervals. Do not take it more often than directed. Do not stop taking except on your doctor's advice. A special MedGuide will be given to you by the pharmacist with each prescription and refill. Be sure to read this information carefully each time. Talk to your pediatrician regarding the use of this medicine in children. Special care may be needed. Overdosage: If you think you have taken too much of this medicine contact a poison control center or emergency room at once. NOTE: This medicine is only for you. Do not share this medicine with others. What if I miss a dose? If you miss a dose, skip it. Take your next dose at the normal time. Do not take extra or 2 doses at the same time to make up for the missed dose. What may interact with this medicine? Do not take this medicine with any of the following medications:  any medicines used to stop taking opioids such as methadone or buprenorphine  linezolid  MAOIs like Carbex, Eldepryl, Marplan, Nardil, and Parnate  methylene blue (injected into a vein)  often take narcotic medicines for pain or cough  other medicines that contain bupropion like Zyban or Wellbutrin This medicine may also interact with the following medications:  alcohol  certain medicines for blood pressure like metoprolol, propranolol  certain medicines for depression, anxiety, or psychotic disturbances  certain medicines for HIV or hepatitis  certain medicines for irregular heart beat like propafenone, flecainide  certain medicines for Parkinson's disease like amantadine, levodopa  certain medicines for seizures like carbamazepine, phenytoin, phenobarbital  certain medicines for sleep  cimetidine  clopidogrel  cyclophosphamide  digoxin  disulfiram  furazolidone  isoniazid  nicotine  orphenadrine  procarbazine  steroid medicines like prednisone or cortisone  stimulant medicines for attention disorders,  weight loss, or to stay awake  tamoxifen  theophylline  thiotepa  ticlopidine  tramadol  warfarin This list may not describe all possible interactions. Give your health care provider a list of all the medicines, herbs, non-prescription drugs, or dietary supplements you use. Also tell them if you smoke, drink alcohol, or use illegal drugs. Some items may interact with your medicine. What should I watch for while using this medicine? Visit your doctor or healthcare provider for regular checks on your progress. This medicine may cause serious skin reactions. They can happen weeks to months after starting the medicine. Contact your healthcare provider right away if you notice fevers or flu-like symptoms with a rash. The rash may be red or purple and then turn into blisters or peeling of the skin. Or, you might notice a red rash with swelling of the face, lips or lymph nodes in your neck or under your arms. This medicine may affect blood sugar. Ask your healthcare provider if changes in diet or medicines  are needed if you have diabetes. Patients and their families should watch out for new or worsening depression or thoughts of suicide. Also watch out for sudden changes in feelings such as feeling anxious, agitated, panicky, irritable, hostile, aggressive, impulsive, severely restless, overly excited and hyperactive, or not being able to sleep. If this happens, especially at the beginning of treatment or after a change in dose, call your healthcare provider. Avoid alcoholic drinks while taking this medicine. Drinking large amounts of alcoholic beverages, using sleeping or anxiety medicines, or quickly stopping the use of these agents while taking this medicine may increase your risk for a seizure. Do not drive or use heavy machinery until you know how this medicine affects you. This medicine can impair your ability to perform these tasks. Women should inform their health care provider if they wish to  become pregnant or think they might be pregnant. Losing weight while pregnant is not advised and may cause harm to the unborn child. Talk to your health care provider for more information. What side effects may I notice from receiving this medicine? Side effects that you should report to your doctor or health care professional as soon as possible:  allergic reactions like skin rash, itching or hives, swelling of the face, lips, or tongue  breathing problems  changes in vision  confusion  elevated mood, decreased need for sleep, racing thoughts, impulsive behavior  fast or irregular heartbeat  hallucinations, loss of contact with reality  increased blood pressure  rash, fever, and swollen lymph nodes  redness, blistering, peeling, or loosening of the skin, including inside the mouth  seizures  signs and symptoms of liver injury like dark yellow or brown urine; general ill feeling or flu-like symptoms; light-colored stools; loss of appetite; nausea; right upper belly pain; unusually weak or tired; yellowing of the eyes or skin  suicidal thoughts or other mood changes  vomiting Side effects that usually do not require medical attention (report to your doctor or health care professional if they continue or are bothersome):  constipation  headache  loss of appetite  indigestion, stomach upset  tremors This list may not describe all possible side effects. Call your doctor for medical advice about side effects. You may report side effects to FDA at 1-800-FDA-1088. Where should I keep my medicine? Keep out of the reach of children. Store at room temperature between 15 and 30 degrees C (59 and 86 degrees F). Throw away any unused medicine after the expiration date. NOTE: This sheet is a summary. It may not cover all possible information. If you have questions about this medicine, talk to your doctor, pharmacist, or health care provider.  2020 Elsevier/Gold Standard (2019-04-01  65:99:[id number])   Calorie Counting for Weight Loss Calories are units of energy. Your body needs a certain amount of calories from food to keep you going throughout the day. When you eat more calories than your body needs, your body stores the extra calories as fat. When you eat fewer calories than your body needs, your body burns fat to get the energy it needs. Calorie counting means keeping track of how many calories you eat and drink each day. Calorie counting can be helpful if you need to lose weight. If you make sure to eat fewer calories than your body needs, you should lose weight. Ask your health care provider what a healthy weight is for you. For calorie counting to work, you will need to eat the right number of calories in a day  in order to lose a healthy amount of weight per week. A dietitian can help you determine how many calories you need in a day and will give you suggestions on how to reach your calorie goal.  A healthy amount of weight to lose per week is usually 1-2 lb (0.5-0.9 kg). This usually means that your daily calorie intake should be reduced by 500-750 calories.  Eating 1,200 - 1,500 calories per day can help most women lose weight.  Eating 1,500 - 1,800 calories per day can help most men lose weight. What is my plan? My goal is to have __________ calories per day. If I have this many calories per day, I should lose around __________ pounds per week. What do I need to know about calorie counting? In order to meet your daily calorie goal, you will need to:  Find out how many calories are in each food you would like to eat. Try to do this before you eat.  Decide how much of the food you plan to eat.  Write down what you ate and how many calories it had. Doing this is called keeping a food log. To successfully lose weight, it is important to balance calorie counting with a healthy lifestyle that includes regular activity. Aim for 150 minutes of moderate exercise (such as  walking) or 75 minutes of vigorous exercise (such as running) each week. Where do I find calorie information?  The number of calories in a food can be found on a Nutrition Facts label. If a food does not have a Nutrition Facts label, try to look up the calories online or ask your dietitian for help. Remember that calories are listed per serving. If you choose to have more than one serving of a food, you will have to multiply the calories per serving by the amount of servings you plan to eat. For example, the label on a package of bread might say that a serving size is 1 slice and that there are 90 calories in a serving. If you eat 1 slice, you will have eaten 90 calories. If you eat 2 slices, you will have eaten 180 calories. How do I keep a food log? Immediately after each meal, record the following information in your food log:  What you ate. Don't forget to include toppings, sauces, and other extras on the food.  How much you ate. This can be measured in cups, ounces, or number of items.  How many calories each food and drink had.  The total number of calories in the meal. Keep your food log near you, such as in a small notebook in your pocket, or use a mobile app or website. Some programs will calculate calories for you and show you how many calories you have left for the day to meet your goal. What are some calorie counting tips?   Use your calories on foods and drinks that will fill you up and not leave you hungry: ? Some examples of foods that fill you up are nuts and nut butters, vegetables, lean proteins, and high-fiber foods like whole grains. High-fiber foods are foods with more than 5 g fiber per serving. ? Drinks such as sodas, specialty coffee drinks, alcohol, and juices have a lot of calories, yet do not fill you up.  Eat nutritious foods and avoid empty calories. Empty calories are calories you get from foods or beverages that do not have many vitamins or protein, such as  candy, sweets, and soda. It  is better to have a nutritious high-calorie food (such as an avocado) than a food with few nutrients (such as a bag of chips).  Know how many calories are in the foods you eat most often. This will help you calculate calorie counts faster.  Pay attention to calories in drinks. Low-calorie drinks include water and unsweetened drinks.  Pay attention to nutrition labels for "low fat" or "fat free" foods. These foods sometimes have the same amount of calories or more calories than the full fat versions. They also often have added sugar, starch, or salt, to make up for flavor that was removed with the fat.  Find a way of tracking calories that works for you. Get creative. Try different apps or programs if writing down calories does not work for you. What are some portion control tips?  Know how many calories are in a serving. This will help you know how many servings of a certain food you can have.  Use a measuring cup to measure serving sizes. You could also try weighing out portions on a kitchen scale. With time, you will be able to estimate serving sizes for some foods.  Take some time to put servings of different foods on your favorite plates, bowls, and cups so you know what a serving looks like.  Try not to eat straight from a bag or box. Doing this can lead to overeating. Put the amount you would like to eat in a cup or on a plate to make sure you are eating the right portion.  Use smaller plates, glasses, and bowls to prevent overeating.  Try not to multitask (for example, watch TV or use your computer) while eating. If it is time to eat, sit down at a table and enjoy your food. This will help you to know when you are full. It will also help you to be aware of what you are eating and how much you are eating. What are tips for following this plan? Reading food labels  Check the calorie count compared to the serving size. The serving size may be smaller than what  you are used to eating.  Check the source of the calories. Make sure the food you are eating is high in vitamins and protein and low in saturated and trans fats. Shopping  Read nutrition labels while you shop. This will help you make healthy decisions before you decide to purchase your food.  Make a grocery list and stick to it. Cooking  Try to cook your favorite foods in a healthier way. For example, try baking instead of frying.  Use low-fat dairy products. Meal planning  Use more fruits and vegetables. Half of your plate should be fruits and vegetables.  Include lean proteins like poultry and fish. How do I count calories when eating out?  Ask for smaller portion sizes.  Consider sharing an entree and sides instead of getting your own entree.  If you get your own entree, eat only half. Ask for a box at the beginning of your meal and put the rest of your entree in it so you are not tempted to eat it.  If calories are listed on the menu, choose the lower calorie options.  Choose dishes that include vegetables, fruits, whole grains, low-fat dairy products, and lean protein.  Choose items that are boiled, broiled, grilled, or steamed. Stay away from items that are buttered, battered, fried, or served with cream sauce. Items labeled "crispy" are usually fried, unless stated otherwise.  Choose water, low-fat milk, unsweetened iced tea, or other drinks without added sugar. If you want an alcoholic beverage, choose a lower calorie option such as a glass of wine or light beer.  Ask for dressings, sauces, and syrups on the side. These are usually high in calories, so you should limit the amount you eat.  If you want a salad, choose a garden salad and ask for grilled meats. Avoid extra toppings like bacon, cheese, or fried items. Ask for the dressing on the side, or ask for olive oil and vinegar or lemon to use as dressing.  Estimate how many servings of a food you are given. For  example, a serving of cooked rice is  cup or about the size of half a baseball. Knowing serving sizes will help you be aware of how much food you are eating at restaurants. The list below tells you how big or small some common portion sizes are based on everyday objects: ? 1 oz--4 stacked dice. ? 3 oz--1 deck of cards. ? 1 tsp--1 die. ? 1 Tbsp-- a ping-pong ball. ? 2 Tbsp--1 ping-pong ball. ?  cup-- baseball. ? 1 cup--1 baseball. Summary  Calorie counting means keeping track of how many calories you eat and drink each day. If you eat fewer calories than your body needs, you should lose weight.  A healthy amount of weight to lose per week is usually 1-2 lb (0.5-0.9 kg). This usually means reducing your daily calorie intake by 500-750 calories.  The number of calories in a food can be found on a Nutrition Facts label. If a food does not have a Nutrition Facts label, try to look up the calories online or ask your dietitian for help.  Use your calories on foods and drinks that will fill you up, and not on foods and drinks that will leave you hungry.  Use smaller plates, glasses, and bowls to prevent overeating. This information is not intended to replace advice given to you by your health care provider. Make sure you discuss any questions you have with your health care provider. Document Revised: 02/12/2018 Document Reviewed: 04/25/2016 Elsevier Patient Education  2020 Elsevier Inc.   Fat and Cholesterol Restricted Eating Plan Getting too much fat and cholesterol in your diet may cause health problems. Choosing the right foods helps keep your fat and cholesterol at normal levels. This can keep you from getting certain diseases. Your doctor may recommend an eating plan that includes:  Total fat: ______% or less of total calories a day.  Saturated fat: ______% or less of total calories a day.  Cholesterol: less than _________mg a day.  Fiber: ______g a day. What are tips for  following this plan? Meal planning  At meals, divide your plate into four equal parts: ? Fill one-half of your plate with vegetables and green salads. ? Fill one-fourth of your plate with whole grains. ? Fill one-fourth of your plate with low-fat (lean) protein foods.  Eat fish that is high in omega-3 fats at least two times a week. This includes mackerel, tuna, sardines, and salmon.  Eat foods that are high in fiber, such as whole grains, beans, apples, broccoli, carrots, peas, and barley. General tips   Work with your doctor to lose weight if you need to.  Avoid: ? Foods with added sugar. ? Fried foods. ? Foods with partially hydrogenated oils.  Limit alcohol intake to no more than 1 drink a day for nonpregnant women and 2 drinks a day  for men. One drink equals 12 oz of beer, 5 oz of wine, or 1 oz of hard liquor. Reading food labels  Check food labels for: ? Trans fats. ? Partially hydrogenated oils. ? Saturated fat (g) in each serving. ? Cholesterol (mg) in each serving. ? Fiber (g) in each serving.  Choose foods with healthy fats, such as: ? Monounsaturated fats. ? Polyunsaturated fats. ? Omega-3 fats.  Choose grain products that have whole grains. Look for the word "whole" as the first word in the ingredient list. Cooking  Cook foods using low-fat methods. These include baking, boiling, grilling, and broiling.  Eat more home-cooked foods. Eat at restaurants and buffets less often.  Avoid cooking using saturated fats, such as butter, cream, palm oil, palm kernel oil, and coconut oil. Recommended foods  Fruits  All fresh, canned (in natural juice), or frozen fruits. Vegetables  Fresh or frozen vegetables (raw, steamed, roasted, or grilled). Green salads. Grains  Whole grains, such as whole wheat or whole grain breads, crackers, cereals, and pasta. Unsweetened oatmeal, bulgur, barley, quinoa, or brown rice. Corn or whole wheat flour tortillas. Meats and other  protein foods  Ground beef (85% or leaner), grass-fed beef, or beef trimmed of fat. Skinless chicken or [place]. Ground chicken or [place]. Pork trimmed of fat. All fish and seafood. Egg whites. Dried beans, peas, or lentils. Unsalted nuts or seeds. Unsalted canned beans. Nut butters without added sugar or oil. Dairy  Low-fat or nonfat dairy products, such as skim or 1% milk, 2% or reduced-fat cheeses, low-fat and fat-free ricotta or cottage cheese, or plain low-fat and nonfat yogurt. Fats and oils  Tub margarine without trans fats. Light or reduced-fat mayonnaise and salad dressings. Avocado. Olive, canola, sesame, or safflower oils. The items listed above may not be a complete list of foods and beverages you can eat. Contact a dietitian for more information. Foods to avoid Fruits  Canned fruit in heavy syrup. Fruit in cream or butter sauce. Fried fruit. Vegetables  Vegetables cooked in cheese, cream, or butter sauce. Fried vegetables. Grains  White bread. White pasta. White rice. Cornbread. Bagels, pastries, and croissants. Crackers and snack foods that contain trans fat and hydrogenated oils. Meats and other protein foods  Fatty cuts of meat. Ribs, chicken wings, bacon, sausage, bologna, salami, chitterlings, fatback, hot dogs, bratwurst, and packaged lunch meats. Liver and organ meats. Whole eggs and egg yolks. Chicken and [place] with skin. Fried meat. Dairy  Whole or 2% milk, cream, half-and-half, and cream cheese. Whole milk cheeses. Whole-fat or sweetened yogurt. Full-fat cheeses. Nondairy creamers and whipped toppings. Processed cheese, cheese spreads, and cheese curds. Beverages  Alcohol. Sugar-sweetened drinks such as sodas, lemonade, and fruit drinks. Fats and oils  Butter, stick margarine, lard, shortening, ghee, or bacon fat. Coconut, palm kernel, and palm oils. Sweets and desserts  Corn syrup, sugars, honey, and molasses. Candy. Jam and jelly. Syrup. Sweetened cereals.  Cookies, pies, cakes, donuts, muffins, and ice cream. The items listed above may not be a complete list of foods and beverages you should avoid. Contact a dietitian for more information. Summary  Choosing the right foods helps keep your fat and cholesterol at normal levels. This can keep you from getting certain diseases.  At meals, fill one-half of your plate with vegetables and green salads.  Eat high-fiber foods, like whole grains, beans, apples, carrots, peas, and barley.  Limit added sugar, saturated fats, alcohol, and fried foods. This information is not intended to replace advice given  to you by your health care provider. Make sure you discuss any questions you have with your health care provider. Document Revised: 01/27/2018 Document Reviewed: 02/10/2017 Elsevier Patient Education  2020 [employer].

## 2020-05-07 NOTE — Progress Notes (Addendum)
Established patient visit   Patient: April Davenport   DOB: 19-Oct-1990   29 y.o. Female  MRN: [id number] Visit Date: 05/07/2020  Today's healthcare provider: Jairo Ben, FNP   Chief Complaint  Patient presents with  . Obesity   Subjective    HPI  Patient comes in office today to address recent weight gain in the past 82months or less. Patient states that her weight at it highest was 240lbs, before pregnancy patient reports that she was in the 190s. Patient reports following a well balanced diet and is actively exercising twice a week.    She has taken phentermine in past and did not like the way it made her feel.   She would like to consider bariatric surgery in the future but would like to try to lose weight on her on.  She reports this has been an ongoing issue for years..  Family history diabetes, hypertension.  She is trying to watch bread intake and decreasing sodas. She has cut out pork. Walking some could exercise more she reports. She has had a bariatric consult in past.   Patient  denies any fever, body aches,chills, rash, chest pain, shortness of breath, nausea, vomiting, or diarrhea.  Denies dizziness, lightheadedness, pre syncopal or syncopal episodes.   Patient  denies any fever, body aches,chills, rash, chest pain, shortness of breath, nausea, vomiting, or diarrhea.   Patient's last menstrual period was 04/25/2020 (approximate). tubal ligation.      Medications: Outpatient Medications Prior to Visit  Medication Sig  . cyclobenzaprine (FLEXERIL) 5 MG tablet Take 1 tablet (5 mg total) by mouth at bedtime.  . fluticasone (FLONASE ALLERGY RELIEF) 50 MCG/ACT nasal spray Place 2 sprays into both nostrils daily.   No facility-administered medications prior to visit.    Review of Systems  Constitutional: Positive for unexpected weight change (gain ). Negative for activity change, appetite change, chills, diaphoresis and fever.  HENT: Negative.     Eyes: Negative.   Respiratory: Negative.   Cardiovascular: Negative.   Gastrointestinal: Negative.   Endocrine: Negative.   Genitourinary: Negative.   Musculoskeletal: Negative.   Skin: Negative.   Neurological: Negative.   Hematological: Negative.   Psychiatric/Behavioral: Negative.       Objective    BP 123/84   Pulse 93   Temp 98.2 F (36.8 C) (Oral)   Resp 16   Wt 203 lb 12.8 oz (92.4 kg)   LMP 04/25/2020 (Approximate)   SpO2 100%   Breastfeeding No   BMI 38.51 kg/m    Physical Exam Vitals reviewed.  Constitutional:      General: She is not in acute distress.    Appearance: She is well-developed. She is obese. She is not ill-appearing, toxic-appearing or diaphoretic.     Interventions: She is not intubated. HENT:     Head: Normocephalic and atraumatic.     Right Ear: Tympanic membrane, ear canal and external ear normal. There is no impacted cerumen.     Left Ear: Tympanic membrane, ear canal and external ear normal. There is no impacted cerumen.     Nose: No congestion or rhinorrhea.     Mouth/Throat:     Pharynx: No oropharyngeal exudate.  Eyes:     General: Lids are normal. No scleral icterus.       Right eye: No discharge.        Left eye: No discharge.     Conjunctiva/sclera: Conjunctivae normal.     Right eye: Right conjunctiva  is not injected. No exudate or hemorrhage.    Left eye: Left conjunctiva is not injected. No exudate or hemorrhage.    Pupils: Pupils are equal, round, and reactive to light.  Neck:     Thyroid: No thyroid mass or thyromegaly.     Vascular: Normal carotid pulses. No carotid bruit, hepatojugular reflux or JVD.     Trachea: Trachea and phonation normal. No tracheal tenderness or tracheal deviation.     Meningeal: Brudzinski's sign and Kernig's sign absent.     Comments: Patient is alert and oriented and responsive to questions Engages in eye contact with provider. Speaks in full sentences without any pauses without any shortness  of breath or distress.   Cardiovascular:     Rate and Rhythm: Normal rate and regular rhythm.     Pulses: Normal pulses.          Radial pulses are 2+ on the right side and 2+ on the left side.       Dorsalis pedis pulses are 2+ on the right side and 2+ on the left side.       Posterior tibial pulses are 2+ on the right side and 2+ on the left side.     Heart sounds: Normal heart sounds, S1 normal and S2 normal. Heart sounds not distant. No murmur heard.  No friction rub. No gallop.   Pulmonary:     Effort: Pulmonary effort is normal. No tachypnea, bradypnea, accessory muscle usage or respiratory distress. She is not intubated.     Breath sounds: Normal breath sounds. No stridor. No wheezing, rhonchi or rales.  Chest:     Chest wall: No tenderness.  Abdominal:     General: Bowel sounds are normal. There is no distension or abdominal bruit.     Palpations: Abdomen is soft. There is no shifting dullness, fluid wave, hepatomegaly, splenomegaly, mass or pulsatile mass.     Tenderness: There is no abdominal tenderness. There is no right CVA tenderness, left CVA tenderness, guarding or rebound.     Hernia: No hernia is present.  Musculoskeletal:        General: No tenderness or deformity. Normal range of motion.     Cervical back: Full passive range of motion without pain, normal range of motion and neck supple. No edema, erythema, rigidity or tenderness. No spinous process tenderness or muscular tenderness. Normal range of motion.  Lymphadenopathy:     Head:     Right side of head: No submental, submandibular, tonsillar, preauricular, posterior auricular or occipital adenopathy.     Left side of head: No submental, submandibular, tonsillar, preauricular, posterior auricular or occipital adenopathy.     Cervical: No cervical adenopathy.     Right cervical: No superficial, deep or posterior cervical adenopathy.    Left cervical: No superficial, deep or posterior cervical adenopathy.     Upper  Body:     Right upper body: No supraclavicular or pectoral adenopathy.     Left upper body: No supraclavicular or pectoral adenopathy.  Skin:    General: Skin is warm and dry.     Coloration: Skin is not pale.     Findings: No abrasion, bruising, burn, ecchymosis, erythema, lesion, petechiae or rash.     Nails: There is no clubbing.  Neurological:     General: No focal deficit present.     Mental Status: She is alert and oriented to person, place, and time.     GCS: GCS eye subscore is 4. GCS  verbal subscore is 5. GCS motor subscore is 6.     Cranial Nerves: No cranial nerve deficit.     Sensory: No sensory deficit.     Motor: No tremor, atrophy, abnormal muscle tone or seizure activity.     Coordination: Coordination normal.     Gait: Gait normal.     Deep Tendon Reflexes: Reflexes are normal and symmetric. Reflexes normal. Babinski sign absent on the right side. Babinski sign absent on the left side.     Reflex Scores:      Tricep reflexes are 2+ on the right side and 2+ on the left side.      Bicep reflexes are 2+ on the right side and 2+ on the left side.      Brachioradialis reflexes are 2+ on the right side and 2+ on the left side.      Patellar reflexes are 2+ on the right side and 2+ on the left side.      Achilles reflexes are 2+ on the right side and 2+ on the left side. Psychiatric:        Mood and Affect: Mood normal.        Speech: Speech normal.        Behavior: Behavior normal.        Thought Content: Thought content normal.        Judgment: Judgment normal.      No results found for any visits on 05/07/20.  Assessment & Plan     Weight gain - Plan: Ambulatory referral to Nutrition and Diabetic Education, Naltrexone-buPROPion HCl ER 8-90 MG TB12, HgB A1c, CBC with Differential/Platelet, Comprehensive Metabolic Panel (CMET), TSH, Iron, TIBC and Ferritin Panel  Body mass index (BMI) of 38.0-38.9 in adult - Plan: Ambulatory referral to Nutrition and Diabetic  Education, Naltrexone-buPROPion HCl ER 8-90 MG TB12  Anxiety  Meds ordered this encounter  Medications  . Naltrexone-buPROPion HCl ER 8-90 MG TB12    Sig: Start 1 tablet every morning for 7 days, then 1 tablet twice daily for 7 days, then 2 tablets every morning and one in the evening    Dispense:  120 tablet    Refill:  0   Orders Placed This Encounter  Procedures  . HgB A1c  . CBC with Differential/Platelet  . Comprehensive Metabolic Panel (CMET)  . TSH  . Iron, TIBC and Ferritin Panel  . Ambulatory referral to Nutrition and Diabetic Education   Please schedule a follow up appointment in 1 month.  Red Flags discussed. The patient was given clear instructions to go to ER or return to medical center if any red flags develop, symptoms do not improve, worsen or new problems develop. They verbalized understanding.   An After Visit Summary was printed and given to the patient.  The patient is advised to begin progressive daily aerobic exercise program, lifestyle and dietary changes.  Return in about 1 month (around 06/06/2020), or if symptoms worsen or fail to improve, for at any time for any worsening symptoms, Go to Emergency room/ urgent care if worse.        Jairo Ben, FNP  [facility] [phone] (phone) [phone] (fax)  [facility]

## 2020-05-14 ENCOUNTER — Encounter: Payer: Self-pay | Admitting: General Practice

## 2020-06-02 ENCOUNTER — Encounter: Payer: Self-pay | Admitting: Adult Health

## 2020-06-06 ENCOUNTER — Telehealth (INDEPENDENT_AMBULATORY_CARE_PROVIDER_SITE_OTHER): Payer: Federal, State, Local not specified - PPO | Admitting: Adult Health

## 2020-06-06 ENCOUNTER — Encounter: Payer: Self-pay | Admitting: Adult Health

## 2020-06-06 VITALS — Wt 206.0 lb

## 2020-06-06 DIAGNOSIS — Z9851 Tubal ligation status: Secondary | ICD-10-CM

## 2020-06-06 DIAGNOSIS — E669 Obesity, unspecified: Secondary | ICD-10-CM

## 2020-06-06 DIAGNOSIS — R635 Abnormal weight gain: Secondary | ICD-10-CM | POA: Insufficient documentation

## 2020-06-06 DIAGNOSIS — I10 Essential (primary) hypertension: Secondary | ICD-10-CM

## 2020-06-06 DIAGNOSIS — F411 Generalized anxiety disorder: Secondary | ICD-10-CM

## 2020-06-06 DIAGNOSIS — Z6838 Body mass index (BMI) 38.0-38.9, adult: Secondary | ICD-10-CM

## 2020-06-06 NOTE — Patient Instructions (Signed)
Health Maintenance, Female Adopting a healthy lifestyle and getting preventive care are important in promoting health and wellness. Ask your health care provider about:  The right schedule for you to have regular tests and exams.  Things you can do on your own to prevent diseases and keep yourself healthy. What should I know about diet, weight, and exercise? Eat a healthy diet   Eat a diet that includes plenty of vegetables, fruits, low-fat dairy products, and lean protein.  Do not eat a lot of foods that are high in solid fats, added sugars, or sodium. Maintain a healthy weight Body mass index (BMI) is used to identify weight problems. It estimates body fat based on height and weight. Your health care provider can help determine your BMI and help you achieve or maintain a healthy weight. Get regular exercise Get regular exercise. This is one of the most important things you can do for your health. Most adults should:  Exercise for at least 150 minutes each week. The exercise should increase your heart rate and make you sweat (moderate-intensity exercise).  Do strengthening exercises at least twice a week. This is in addition to the moderate-intensity exercise.  Spend less time sitting. Even light physical activity can be beneficial. Watch cholesterol and blood lipids Have your blood tested for lipids and cholesterol at 31 years of age, then have this test every 5 years. Have your cholesterol levels checked more often if:  Your lipid or cholesterol levels are high.  You are older than 31 years of age.  You are at high risk for heart disease. What should I know about cancer screening? Depending on your health history and family history, you may need to have cancer screening at various ages. This may include screening for:  Breast cancer.  Cervical cancer.  Colorectal cancer.  Skin cancer.  Lung cancer. What should I know about heart disease, diabetes, and high blood  pressure? Blood pressure and heart disease  High blood pressure causes heart disease and increases the risk of stroke. This is more likely to develop in people who have high blood pressure readings, are of African descent, or are overweight.  Have your blood pressure checked: ? Every 3-5 years if you are 18-39 years of age. ? Every year if you are 40 years old or older. Diabetes Have regular diabetes screenings. This checks your fasting blood sugar level. Have the screening done:  Once every three years after age 40 if you are at a normal weight and have a low risk for diabetes.  More often and at a younger age if you are overweight or have a high risk for diabetes. What should I know about preventing infection? Hepatitis B If you have a higher risk for hepatitis B, you should be screened for this virus. Talk with your health care provider to find out if you are at risk for hepatitis B infection. Hepatitis C Testing is recommended for:  Everyone born from 1945 through 1965.  Anyone with known risk factors for hepatitis C. Sexually transmitted infections (STIs)  Get screened for STIs, including gonorrhea and chlamydia, if: ? You are sexually active and are younger than 31 years of age. ? You are older than 31 years of age and your health care provider tells you that you are at risk for this type of infection. ? Your sexual activity has changed since you were last screened, and you are at increased risk for chlamydia or gonorrhea. Ask your health care provider if   you are at risk.  Ask your health care provider about whether you are at high risk for HIV. Your health care provider may recommend a prescription medicine to help prevent HIV infection. If you choose to take medicine to prevent HIV, you should first get tested for HIV. You should then be tested every 3 months for as long as you are taking the medicine. Pregnancy  If you are about to stop having your period (premenopausal) and  you may become pregnant, seek counseling before you get pregnant.  Take 400 to 800 micrograms (mcg) of folic acid every day if you become pregnant.  Ask for birth control (contraception) if you want to prevent pregnancy. Osteoporosis and menopause Osteoporosis is a disease in which the bones lose minerals and strength with aging. This can result in bone fractures. If you are 65 years old or older, or if you are at risk for osteoporosis and fractures, ask your health care provider if you should:  Be screened for bone loss.  Take a calcium or vitamin D supplement to lower your risk of fractures.  Be given hormone replacement therapy (HRT) to treat symptoms of menopause. Follow these instructions at home: Lifestyle  Do not use any products that contain nicotine or tobacco, such as cigarettes, e-cigarettes, and chewing tobacco. If you need help quitting, ask your health care provider.  Do not use street drugs.  Do not share needles.  Ask your health care provider for help if you need support or information about quitting drugs. Alcohol use  Do not drink alcohol if: ? Your health care provider tells you not to drink. ? You are pregnant, may be pregnant, or are planning to become pregnant.  If you drink alcohol: ? Limit how much you use to 0-1 drink a day. ? Limit intake if you are breastfeeding.  Be aware of how much alcohol is in your drink. In the [place] one drink equals one 12 oz bottle of beer (355 mL), one 5 oz glass of wine (148 mL), or one 1 oz glass of hard liquor (44 mL). General instructions  Schedule regular health, dental, and eye exams.  Stay current with your vaccines.  Tell your health care provider if: ? You often feel depressed. ? You have ever been abused or do not feel safe at home. Summary  Adopting a healthy lifestyle and getting preventive care are important in promoting health and wellness.  Follow your health care provider's instructions about healthy  diet, exercising, and getting tested or screened for diseases.  Follow your health care provider's instructions on monitoring your cholesterol and blood pressure. This information is not intended to replace advice given to you by your health care provider. Make sure you discuss any questions you have with your health care provider. Document Revised: 05/19/2018 Document Reviewed: 05/19/2018 [employer] Patient Education  2020 [employer]   Calorie Counting for Weight Loss Calories are units of energy. Your body needs a certain amount of calories from food to keep you going throughout the day. When you eat more calories than your body needs, your body stores the extra calories as fat. When you eat fewer calories than your body needs, your body burns fat to get the energy it needs. Calorie counting means keeping track of how many calories you eat and drink each day. Calorie counting can be helpful if you need to lose weight. If you make sure to eat fewer calories than your body needs, you should lose weight. Ask your   health care provider what a healthy weight is for you. For calorie counting to work, you will need to eat the right number of calories in a day in order to lose a healthy amount of weight per week. A dietitian can help you determine how many calories you need in a day and will give you suggestions on how to reach your calorie goal.  A healthy amount of weight to lose per week is usually 1-2 lb (0.5-0.9 kg). This usually means that your daily calorie intake should be reduced by 500-750 calories.  Eating 1,200 - 1,500 calories per day can help most women lose weight.  Eating 1,500 - 1,800 calories per day can help most men lose weight. What is my plan? My goal is to have __________ calories per day. If I have this many calories per day, I should lose around __________ pounds per week. What do I need to know about calorie counting? In order to meet your daily calorie goal, you will need  to:  Find out how many calories are in each food you would like to eat. Try to do this before you eat.  Decide how much of the food you plan to eat.  Write down what you ate and how many calories it had. Doing this is called keeping a food log. To successfully lose weight, it is important to balance calorie counting with a healthy lifestyle that includes regular activity. Aim for 150 minutes of moderate exercise (such as walking) or 75 minutes of vigorous exercise (such as running) each week. Where do I find calorie information?  The number of calories in a food can be found on a Nutrition Facts label. If a food does not have a Nutrition Facts label, try to look up the calories online or ask your dietitian for help. Remember that calories are listed per serving. If you choose to have more than one serving of a food, you will have to multiply the calories per serving by the amount of servings you plan to eat. For example, the label on a package of bread might say that a serving size is 1 slice and that there are 90 calories in a serving. If you eat 1 slice, you will have eaten 90 calories. If you eat 2 slices, you will have eaten 180 calories. How do I keep a food log? Immediately after each meal, record the following information in your food log:  What you ate. Don't forget to include toppings, sauces, and other extras on the food.  How much you ate. This can be measured in cups, ounces, or number of items.  How many calories each food and drink had.  The total number of calories in the meal. Keep your food log near you, such as in a small notebook in your pocket, or use a mobile app or website. Some programs will calculate calories for you and show you how many calories you have left for the day to meet your goal. What are some calorie counting tips?   Use your calories on foods and drinks that will fill you up and not leave you hungry: ? Some examples of foods that fill you up are nuts  and nut butters, vegetables, lean proteins, and high-fiber foods like whole grains. High-fiber foods are foods with more than 5 g fiber per serving. ? Drinks such as sodas, specialty coffee drinks, alcohol, and juices have a lot of calories, yet do not fill you up.  Eat nutritious foods and   avoid empty calories. Empty calories are calories you get from foods or beverages that do not have many vitamins or protein, such as candy, sweets, and soda. It is better to have a nutritious high-calorie food (such as an avocado) than a food with few nutrients (such as a bag of chips).  Know how many calories are in the foods you eat most often. This will help you calculate calorie counts faster.  Pay attention to calories in drinks. Low-calorie drinks include water and unsweetened drinks.  Pay attention to nutrition labels for "low fat" or "fat free" foods. These foods sometimes have the same amount of calories or more calories than the full fat versions. They also often have added sugar, starch, or salt, to make up for flavor that was removed with the fat.  Find a way of tracking calories that works for you. Get creative. Try different apps or programs if writing down calories does not work for you. What are some portion control tips?  Know how many calories are in a serving. This will help you know how many servings of a certain food you can have.  Use a measuring cup to measure serving sizes. You could also try weighing out portions on a kitchen scale. With time, you will be able to estimate serving sizes for some foods.  Take some time to put servings of different foods on your favorite plates, bowls, and cups so you know what a serving looks like.  Try not to eat straight from a bag or box. Doing this can lead to overeating. Put the amount you would like to eat in a cup or on a plate to make sure you are eating the right portion.  Use smaller plates, glasses, and bowls to prevent overeating.  Try  not to multitask (for example, watch TV or use your computer) while eating. If it is time to eat, sit down at a table and enjoy your food. This will help you to know when you are full. It will also help you to be aware of what you are eating and how much you are eating. What are tips for following this plan? Reading food labels  Check the calorie count compared to the serving size. The serving size may be smaller than what you are used to eating.  Check the source of the calories. Make sure the food you are eating is high in vitamins and protein and low in saturated and trans fats. Shopping  Read nutrition labels while you shop. This will help you make healthy decisions before you decide to purchase your food.  Make a grocery list and stick to it. Cooking  Try to cook your favorite foods in a healthier way. For example, try baking instead of frying.  Use low-fat dairy products. Meal planning  Use more fruits and vegetables. Half of your plate should be fruits and vegetables.  Include lean proteins like poultry and fish. How do I count calories when eating out?  Ask for smaller portion sizes.  Consider sharing an entree and sides instead of getting your own entree.  If you get your own entree, eat only half. Ask for a box at the beginning of your meal and put the rest of your entree in it so you are not tempted to eat it.  If calories are listed on the menu, choose the lower calorie options.  Choose dishes that include vegetables, fruits, whole grains, low-fat dairy products, and lean protein.  Choose items that are   boiled, broiled, grilled, or steamed. Stay away from items that are buttered, battered, fried, or served with cream sauce. Items labeled "crispy" are usually fried, unless stated otherwise.  Choose water, low-fat milk, unsweetened iced tea, or other drinks without added sugar. If you want an alcoholic beverage, choose a lower calorie option such as a glass of wine or  light beer.  Ask for dressings, sauces, and syrups on the side. These are usually high in calories, so you should limit the amount you eat.  If you want a salad, choose a garden salad and ask for grilled meats. Avoid extra toppings like bacon, cheese, or fried items. Ask for the dressing on the side, or ask for olive oil and vinegar or lemon to use as dressing.  Estimate how many servings of a food you are given. For example, a serving of cooked rice is  cup or about the size of half a baseball. Knowing serving sizes will help you be aware of how much food you are eating at restaurants. The list below tells you how big or small some common portion sizes are based on everyday objects: ? 1 oz-4 stacked dice. ? 3 oz-1 deck of cards. ? 1 tsp-1 die. ? 1 Tbsp- a ping-pong ball. ? 2 Tbsp-1 ping-pong ball. ?  cup- baseball. ? 1 cup-1 baseball. Summary  Calorie counting means keeping track of how many calories you eat and drink each day. If you eat fewer calories than your body needs, you should lose weight.  A healthy amount of weight to lose per week is usually 1-2 lb (0.5-0.9 kg). This usually means reducing your daily calorie intake by 500-750 calories.  The number of calories in a food can be found on a Nutrition Facts label. If a food does not have a Nutrition Facts label, try to look up the calories online or ask your dietitian for help.  Use your calories on foods and drinks that will fill you up, and not on foods and drinks that will leave you hungry.  Use smaller plates, glasses, and bowls to prevent overeating. This information is not intended to replace advice given to you by your health care provider. Make sure you discuss any questions you have with your health care provider. Document Revised: 02/12/2018 Document Reviewed: 04/25/2016 [employer] Patient Education  2020 [employer]  

## 2020-06-06 NOTE — Progress Notes (Addendum)
MyChart Telephone- video interrupted.  Visit    Virtual Visit via Video Note   This visit type was conducted due to national recommendations for restrictions regarding the COVID-19 Pandemic (e.g. social distancing) in an effort to limit this patient's exposure and mitigate transmission in our community. This patient is at least at moderate risk for complications without adequate follow up. This format is felt to be most appropriate for this patient at this time. Physical exam was limited by quality of the video and audio technology used for the visit.    Parties involved in visit as below:    Patient location: at home  Provider location: Provider: Provider's office at  [facility] [place].     I discussed the limitations of evaluation and management by telemedicine and the availability of in person appointments. The patient expressed understanding and agreed to proceed.  Patient: April Davenport   DOB: Sep 02, 1990   29 y.o. Female  MRN: [id number] Visit Date: 06/06/2020  Today's healthcare provider: Jairo Ben, FNP   No chief complaint on file.  Subjective    HPI  Follow up for obesity  The patient was last seen for this 1 months ago. Changes made at last visit include patient was started on Naltrexone-buPROPion HCl ER 8-90 MG  .  She reports poor compliance with treatment. Patient states that she never picked up prescription due to cost and would like to discuss getting bariatric surgery.  She did not start medication due to cost.   206 lbs reported today.   She desires to see bariatric clinic for evaluation, she has had trouble with weight loss and weight gain. She has already contacted [facility] bariatric clinic and she wants a referral  there.   Patient  denies any fever, body aches,chills, rash, chest pain, shortness of breath, nausea, vomiting, or diarrhea.    -----------------------------------------------------------------------------------------   Patient Active Problem List   Diagnosis Date Noted  . Weight gain 06/06/2020  . Viral upper respiratory tract infection 03/20/2020  . Anxiety 03/20/2020  . History of bilateral tubal ligation 11/10/2019  . Abdominal cramping 11/10/2019  . Lactational amenorrhea 11/10/2019  . 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 Social anxiety disorder 10/20/2019  . Generalized anxiety disorder 10/06/2019  . Postpartum depression 10/06/2019  . History of hyperthyroidism 09/13/2019  . Breast feeding status of mother 09/13/2019  . Vitamin D deficiency 09/13/2019  . Stress headaches 09/13/2019  . Prediabetes 09/13/2019  . Acute non-recurrent frontal sinusitis 09/13/2019  . Seasonal allergies 09/13/2019  . Anemia 09/13/2019  . Acute serous otitis media of left ear 09/13/2019  . Low TSH level 12/13/2018  . History of pre-eclampsia 12/09/2018  . Class 2 obesity without serious comorbidity with body mass index (BMI) of 38.0 to 38.9 in adult 12/09/2018  . History of kidney stones 03/18/2018  . Obesity, Class III, BMI 40-49.9 (morbid obesity) (HCC) 03/18/2018  . IUD (intrauterine device) in place 08/19/2013  . Hypertension 06/27/2013  . Routine health maintenance 06/27/2013  . Postpartum care following cesarean delivery 06/27/2013  . Cesarean delivery delivered 05/30/2013  . Preterm delivery, delivered 05/30/2013  . Antepartum mild preeclampsia 05/15/2013  . Encounter for supervision of normal pregnancy in multigravida 01/20/2013  . Family history of diabetes mellitus type II 01/20/2013  . BMI 36.0-36.9,adult 12/15/2012  . LGSIL (low grade squamous intraepithelial lesion) on Pap smear 01/05/2012  . Cervical high risk human papillomavirus (HPV) DNA test positive 11/20/2011  . 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 Allergy   . Anemia   . Anxiety   .  Depression   . Hypertension   . Lactating mother   . Right nephrolithiasis    Past Surgical History:  Procedure Laterality Date  . CESAREAN SECTION  05/16/2013  . CESAREAN SECTION WITH BILATERAL TUBAL LIGATION Bilateral 06/30/2019   Procedure: CESAREAN SECTION WITH BILATERAL TUBAL LIGATION;  Surgeon: Tereso Newcomer, MD;  Location: [facility] LD ORS;  Service: Obstetrics;  Laterality: Bilateral;  . KNEE SURGERY Right    Social History   Tobacco Use  . Smoking status: Never Smoker  . Smokeless tobacco: Never Used  Vaping Use  . Vaping Use: Never used  Substance Use Topics  . Alcohol use: Not Currently    Comment: rare  . Drug use: Never   Social History   Socioeconomic History  . Marital status: Single    Spouse name: Not on file  . Number of children: Not on file  . Years of education: Not on file  . Highest education level: Not on file  Occupational History  . Not on file  Tobacco Use  . Smoking status: Never Smoker  . Smokeless tobacco: Never Used  Vaping Use  . Vaping Use: Never used  Substance and Sexual Activity  . Alcohol use: Not Currently    Comment: rare  . Drug use: Never  . Sexual activity: Yes    Birth control/protection: None, Surgical    Comment: tubal ligation   Other Topics Concern  . Not on file  Social History Narrative  . Not on file   Social Determinants of Health   Financial Resource Strain: Not on file  Food Insecurity: Not on file  Transportation Needs: Not on file  Physical Activity: Not on file  Stress: Not on file  Social Connections: Not on file  Intimate Partner Violence: Not on file   Family Status  Relation Name Status  . Mother  Alive  . Father  Deceased  . MGM  Deceased  . MGF  Deceased  . PGM  Alive  . PGF  Deceased  . Daughter  (Not Specified)  . Mat Aunt  (Not Specified)   Family History  Problem Relation Age of Onset  . Diabetes Mother   . Hypertension Mother   . Heart attack Mother   . Heart attack Father   .  Hypertension Father   . Diabetes Father   . Stroke Father   . Stroke Maternal Grandfather   . Diabetes Paternal Grandfather   . Asthma Daughter   . Sickle cell anemia Maternal Aunt   . Heart Problems Maternal Aunt    Allergies  Allergen Reactions  . Dilaudid [Hydromorphone Hcl] Hives, Shortness Of Breath and Itching    Syncope  . Black [employer] and Swelling  . Coconut Flavor Swelling      Medications: Outpatient Medications Prior to Visit  Medication Sig  . cyclobenzaprine (FLEXERIL) 5 MG tablet Take 1 tablet (5 mg total) by mouth at bedtime.  . fluticasone (FLONASE ALLERGY RELIEF) 50 MCG/ACT nasal spray Place 2 sprays into both nostrils daily.  . Naltrexone-buPROPion HCl ER 8-90 MG TB12 Start 1 tablet every morning for 7 days, then 1 tablet twice daily for 7 days, then 2 tablets every morning and one in the evening   No facility-administered medications prior to visit.    Review of Systems  Constitutional: Negative.   HENT: Negative.   Respiratory: Negative.  Cardiovascular: Negative.   Genitourinary: Negative.   Musculoskeletal: Negative.   Neurological: Negative.   Hematological: Negative.   Psychiatric/Behavioral: The patient is nervous/anxious.     Last CBC Lab Results  Component Value Date   WBC 7.3 11/10/2019   HGB 12.9 11/10/2019   HCT 40.6 11/10/2019   MCV 81.0 11/10/2019   MCH 25.7 (L) 11/10/2019   RDW 13.6 11/10/2019   PLT 348 11/10/2019   Last metabolic panel Lab Results  Component Value Date   GLUCOSE 89 11/10/2019   NA 141 11/10/2019   K 3.9 11/10/2019   CL 104 11/10/2019   CO2 27 11/10/2019   BUN 19 11/10/2019   CREATININE 0.76 11/10/2019   GFRNONAA >60 11/10/2019   GFRAA >60 11/10/2019   CALCIUM 9.5 11/10/2019   PROT 7.9 11/10/2019   ALBUMIN 4.1 11/10/2019   LABGLOB 2.8 09/14/2019   AGRATIO 1.6 09/14/2019   BILITOT 0.8 11/10/2019   ALKPHOS 98 11/10/2019   AST 15 11/10/2019   ALT 11 11/10/2019   ANIONGAP 10 11/10/2019    Last lipids Lab Results  Component Value Date   CHOL 210 (H) 09/14/2019   HDL 68 09/14/2019   LDLCALC 132 (H) 09/14/2019   TRIG 55 09/14/2019   Last hemoglobin A1c Lab Results  Component Value Date   HGBA1C 5.4 09/14/2019   Last thyroid functions Lab Results  Component Value Date   TSH 0.927 09/14/2019   T4TOTAL 6.9 09/14/2019   Last vitamin D Lab Results  Component Value Date   VD25OH 14.4 (L) 09/14/2019   Last vitamin B12 and Folate No results found for: VITAMINB12, FOLATE    Objective    Wt 206 lb (93.4 kg)   BMI 38.92 kg/m  BP Readings from Last 3 Encounters:  05/07/20 123/84  03/20/20 123/89  01/06/20 (!) 126/86   Wt Readings from Last 3 Encounters:  06/06/20 206 lb (93.4 kg)  05/07/20 203 lb 12.8 oz (92.4 kg)  03/20/20 197 lb 3.2 oz (89.4 kg)      No vital signs available   Patient is alert and oriented and responsive to questions Engages in conversation with provider. Speaks in full sentences without any pauses without any shortness of breath or distress.  .    Assessment & Plan     Weight gain - Plan: Amb Referral to Bariatric Surgery  Class 2 obesity without serious comorbidity with body mass index (BMI) of 38.0 to 38.9 in adult, unspecified obesity type - Plan: Amb Referral to Bariatric Surgery  History of bilateral tubal ligation- 06/30/19 - Plan: Amb Referral to Bariatric Surgery  Generalized anxiety disorder - Plan: Amb Referral to Bariatric Surgery  Hypertension, unspecified type - Plan: Amb Referral to Bariatric Surgery  Labs are ordered and due can walk into the lab.   Red Flags discussed. The patient was given clear instructions to go to ER or return to medical center if any red flags develop, symptoms do not improve, worsen or new problems develop. They verbalized understanding.  Return in about 3 months (around 09/04/2020), or if symptoms worsen or fail to improve, for at any time for any worsening symptoms, Go to Emergency room/  urgent care if worse.    I discussed the limitations of evaluation and management by telemedicine and the availability of in person appointments. The patient expressed understanding and agreed to proceed.  I discussed the assessment and treatment plan with the patient. The patient was provided an opportunity to ask questions and all were answered.  The patient agreed with the plan and demonstrated an understanding of the instructions.   The patient was advised to call back or seek an in-person evaluation if the symptoms worsen or if the condition fails to improve as anticipated.  I provided 30 minutes of non-face-to-face time during this encounter. The entirety of the information documented in the History of Present Illness, Review of Systems and Physical Exam were personally obtained by me. Portions of this information were initially documented by the CMA and reviewed by me for thoroughness and accuracy.    Jairo BenMichelle Smith Neal Trulson, FNP [facility] [phone] (phone) [phone] (fax)  [facility]

## 2020-06-06 NOTE — Addendum Note (Signed)
Addended by: Berniece Pap on: 06/06/2020 09:33 AM   Modules accepted: Orders

## 2020-06-15 ENCOUNTER — Telehealth: Payer: Self-pay

## 2020-06-15 ENCOUNTER — Encounter: Payer: Self-pay | Admitting: Adult Health

## 2020-06-15 NOTE — Telephone Encounter (Signed)
Copied from CRM [id number]. Topic: General - Other >> Jun 15, 2020  2:28 PM Lyn Hollingshead D wrote: PT need a call back / Yeast infection / refuse appt / please advise

## 2020-06-18 NOTE — Telephone Encounter (Signed)
Patient of Michelle's, please see mychart message from 06/15/20, patient was advised to schedule office visit to evaluate and she is denying office visit because she is out of town, I had suggested urgent care, patient is requesting prescription to treat symptoms. Please advise if appropriate. KW

## 2020-06-18 NOTE — Telephone Encounter (Signed)
Appointment or over-the-counter but would need to be seen to be treated

## 2020-06-18 NOTE — Telephone Encounter (Signed)
[id number] 

## 2020-06-19 DIAGNOSIS — R509 Fever, unspecified: Secondary | ICD-10-CM | POA: Diagnosis not present

## 2020-06-19 DIAGNOSIS — N390 Urinary tract infection, site not specified: Secondary | ICD-10-CM | POA: Diagnosis not present

## 2020-06-19 DIAGNOSIS — R42 Dizziness and giddiness: Secondary | ICD-10-CM | POA: Diagnosis not present

## 2020-06-19 DIAGNOSIS — R197 Diarrhea, unspecified: Secondary | ICD-10-CM | POA: Diagnosis not present

## 2020-06-19 DIAGNOSIS — Z20822 Contact with and (suspected) exposure to covid-19: Secondary | ICD-10-CM | POA: Diagnosis not present

## 2020-06-19 DIAGNOSIS — R55 Syncope and collapse: Secondary | ICD-10-CM | POA: Diagnosis not present

## 2020-07-03 IMAGING — US US MFM OB FOLLOW-UP
1 series · 13 of 28 positions shown · non-contrast
Comparison: none

[Series 1: us mfm ob follow-up · 13 of 39 slices shown]
[im 2/39]
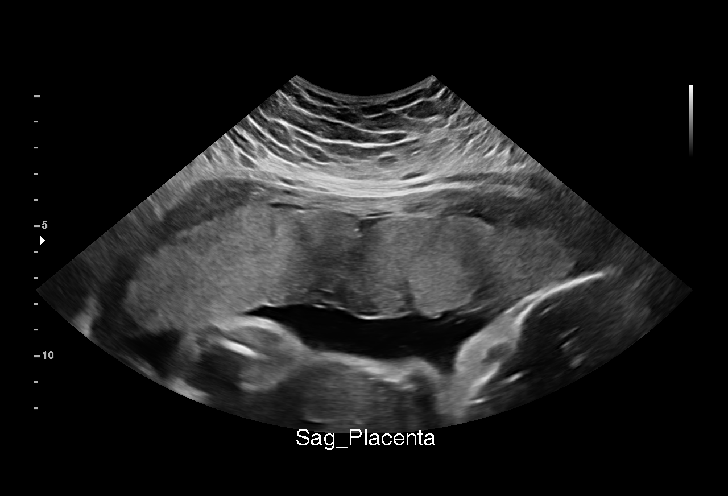
[im 5/39]
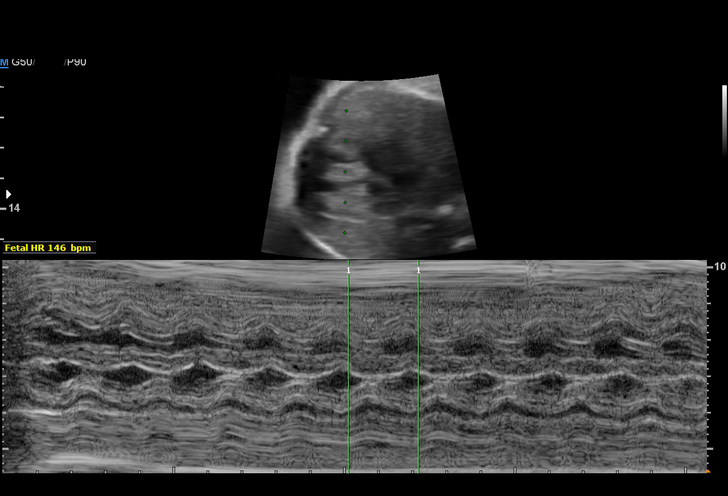
[im 8/39]
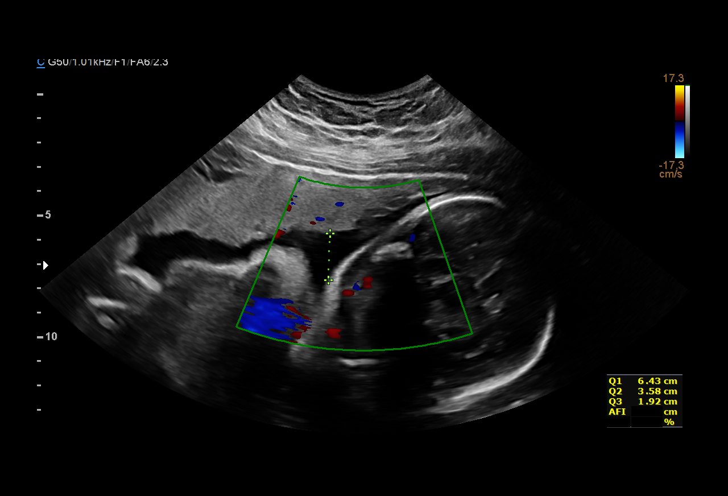
[im 10/39]
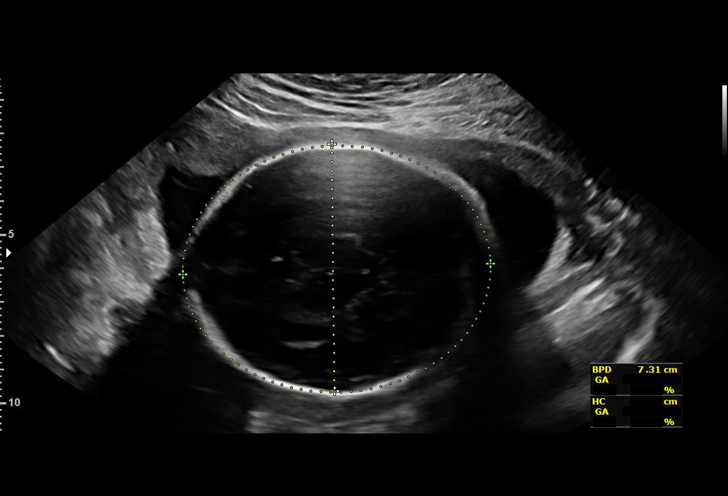
[im 13/39]
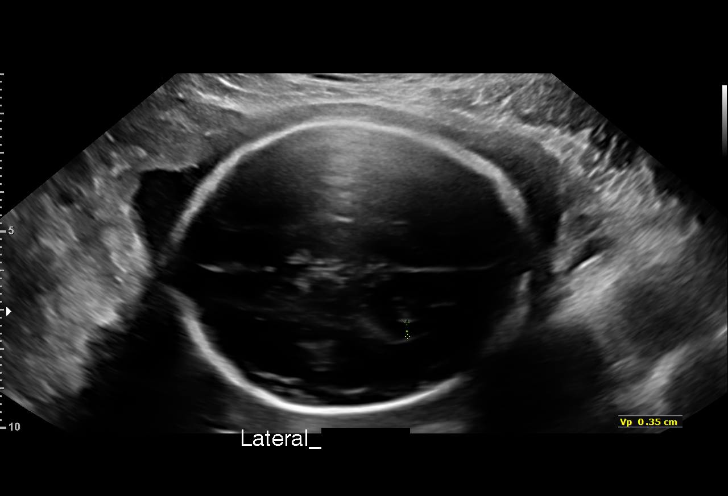
[im 16/39]
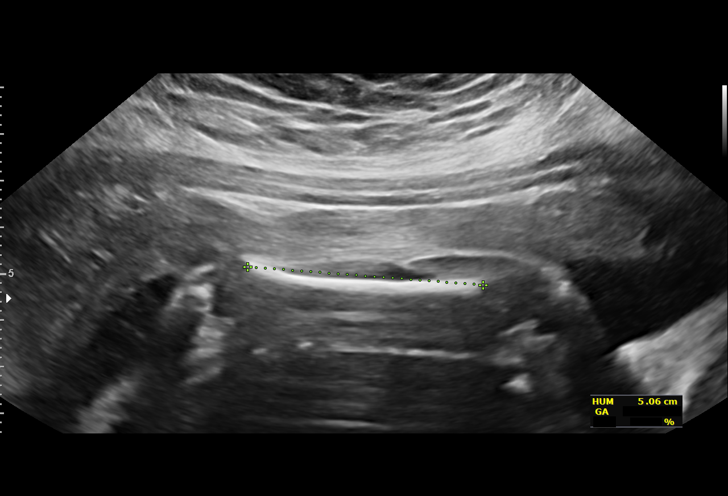
[im 20/39]
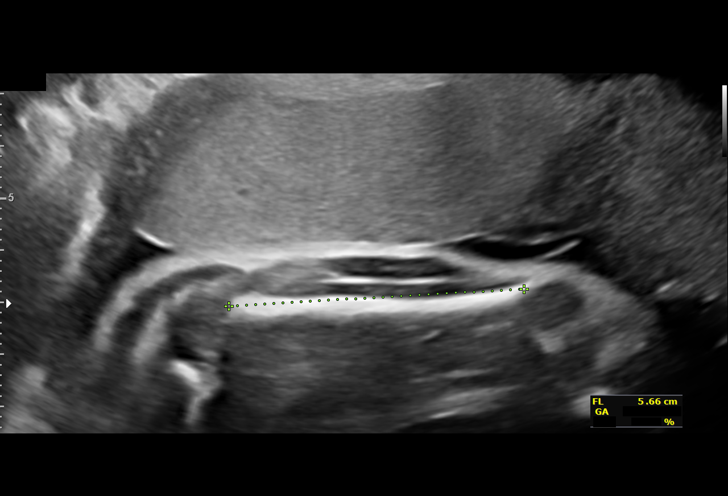
[im 23/39]
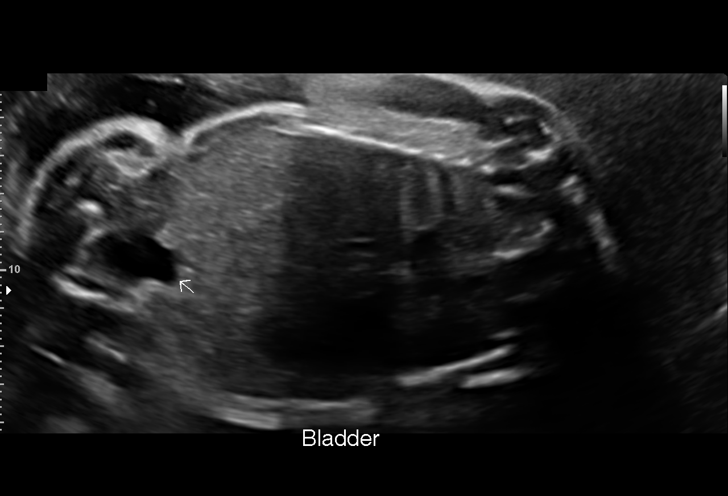
[im 26/39]
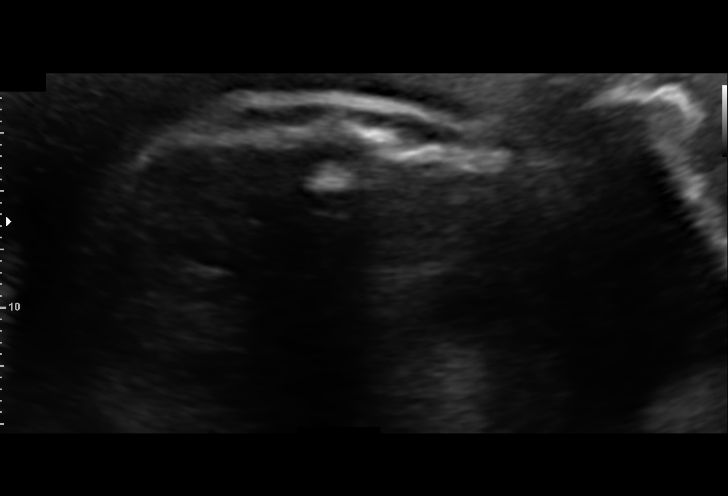
[im 29/39]
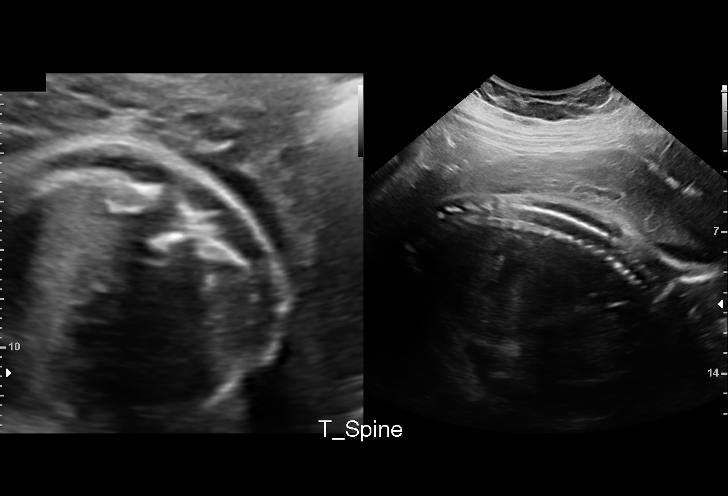
[im 31/39]
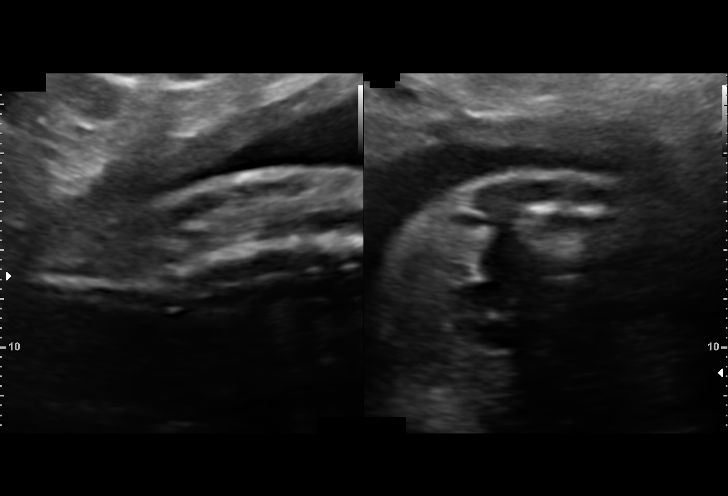
[im 34/39]
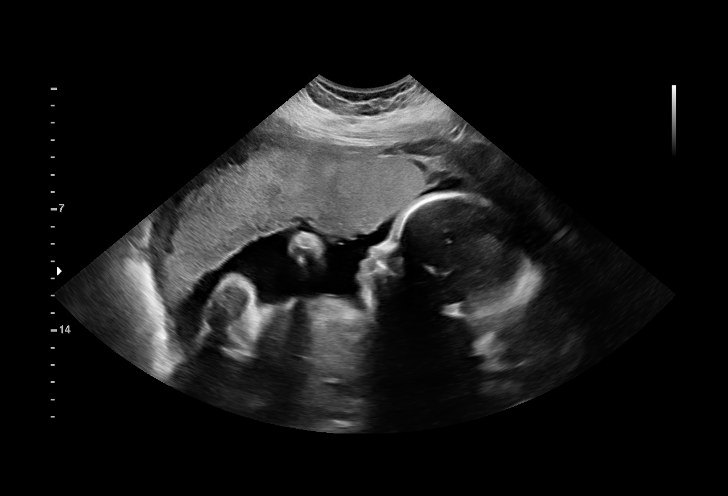
[im 37/39]
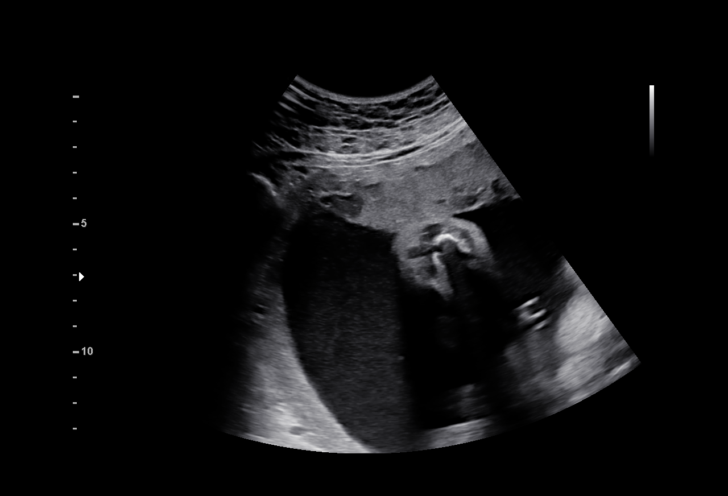

[13 of 28 positions shown; findings below may reference images not displayed]

----------------------------------------------------------------------

 ----------------------------------------------------------------------
Indications

  Hypertension - Chronic/Pre-existing (No
  Meds)
  Obesity complicating pregnancy, second
  trimester (BMI 39)
  Encounter for other antenatal screening
  follow-up
  History of cesarean delivery, currently
  pregnant
  Poor obstetric history: Previous preterm
  delivery, antepartum( 32 wks due to severe
  preeclampsia)
  Poor obstetric history: Previous
  preeclampsia / eclampsia/gestational HTN
  Low Risk NIPS
  Negative Horizon
  28 weeks gestation of pregnancy
 ----------------------------------------------------------------------
Vital Signs

                                                Height:        5'1"
Fetal Evaluation

 Num Of Fetuses:         1
 Fetal Heart Rate(bpm):  146
 Cardiac Activity:       Observed
 Presentation:           Cephalic
 Placenta:               Anterior
 P. Cord Insertion:      Previously Visualized
 Amniotic Fluid
 AFI FV:      Within normal limits

 AFI Sum(cm)     %Tile       Largest Pocket(cm)
 13.85           44

 RUQ(cm)       RLQ(cm)       LUQ(cm)        LLQ(cm)

Biometry

 BPD:      74.5  mm     G. Age:  29w 6d         90  %    CI:        79.77   %    70 - 86
                                                         FL/HC:      21.1   %    18.8 -
 HC:      263.6  mm     G. Age:  28w 5d         40  %    HC/AC:      0.98        1.05 -
 AC:      269.9  mm     G. Age:  31w 1d         99  %    FL/BPD:     74.8   %    71 - 87
 FL:       55.7  mm     G. Age:  29w 2d         74  %    FL/AC:      20.6   %    20 - 24
 HUM:      50.4  mm     G. Age:  29w 4d         77  %

 LV:        3.5  mm

 Est. FW:    1776  gm      3 lb 6 oz     98  %
OB History

 Gravidity:    3         Term:   0        Prem:   1        SAB:   1
 TOP:          0       Ectopic:  0        Living: 1
Gestational Age

 LMP:           28w 0d        Date:  10/06/18                 EDD:   07/13/19
 U/S Today:     29w 5d                                        EDD:   07/01/19
 Best:          28w 0d     Det. By:  LMP  (10/06/18)          EDD:   07/13/19
Anatomy

 Cranium:               Appears normal         LVOT:                   Previously seen
 Cavum:                 Appears normal         Aortic Arch:            Previously seen
 Ventricles:            Appears normal         Ductal Arch:            Previously seen
 Choroid Plexus:        Previously seen        Diaphragm:              Appears normal
 Cerebellum:            Previously seen        Stomach:                Appears normal, left
                                                                       sided
 Posterior Fossa:       Previously seen        Abdomen:                Appears normal
 Nuchal Fold:           previously             Abdominal Wall:         Previously seen
                        enlarged, 6.8 mm
 Face:                  Orbits and profile     Cord Vessels:           Previously seen
                        previously seen
 Lips:                  Previously seen        Kidneys:                Appear normal
 Palate:                Previously seen        Bladder:                Appears normal
 Thoracic:              Appears normal         Spine:                  Limited views
                                                                       appear normal
 Heart:                 Previously seen        Upper Extremities:      Previously seen
 RVOT:                  Previously seen        Lower Extremities:      Previously seen

 Other:  Male gender. Technically difficult due to maternal habitus and fetal
         position.
Comments

 This patient was seen for a follow up growth scan due to a
 history of chronic hypertension. She denies any problems
 since her last exam.
 She was informed that the fetal growth measures above the
 98th percentile for her gestational age. There was normal
 amniotic fluid noted.
 A follow up exam was scheduled in 4 weeks.

## 2020-07-23 ENCOUNTER — Ambulatory Visit: Payer: Self-pay | Admitting: *Deleted

## 2020-07-23 NOTE — Telephone Encounter (Signed)
Pt was diagnosed with covid 2.9.22 and she wanted to know if it is ok for her to travel / she is suppose to leave on 2.16.22/ Pt has no symptoms / please advise  Patient reports date of onset of symptoms for covid were on 07/15/20 Sunday and she was tested for covid on Wednesday 07/15/20 with at home test and results were positive. Mild symptoms reported and denies fever or respiratory symptoms now. Patient notified of CDC guidelines for quarantine.  Criteria for self-isolation if you test positive for COVID-19, regardless of vaccination status:  -If you have mild symptoms that are resolving or have resolved, isolate at home for 5 days since symptoms started AND continue to wear a well-fitted mask when around others in the home and in public for 5 additional days after isolation is completed -If you have a fever and/or moderate to severe symptoms, isolate for at least 10 days since the symptoms started AND until you are fever free for at least 24 hours without the use of fever-reducing medications -If you tested positive and did not have symptoms, isolate for at least 5 days after your positive test   If you must leave home or if you have to be around others please wear a mask. Please limit contact with immediate family members and social distance until your quarantine is over. Patient also asked about her significant other. He was tested on 07/20/20 with no symptoms only being exposed to the patient. Reviewed his quarantine time should start on the date he was tested due to no symptoms reported, and the same 10 day quarantine per CDC guidelines state. patient asked if significant other could be tested again and encouraged to test as PCR test for covid if retested. Patient verbalized understanding .   Reason for Disposition . Health Information question, no triage required and triager able to answer question  Answer Assessment - Initial Assessment Questions 1. REASON FOR CALL or QUESTION: "What is your  reason for calling today?" or "How can I best help you?" or "What question do you have that I can help answer?"     How long is her quarantine for testing positive for covid with at home test .  Protocols used: INFORMATION ONLY CALL - NO TRIAGE-A-AH

## 2020-07-24 NOTE — Telephone Encounter (Signed)
Please review and clarify  I thought patient had to quarantine for 10 days after being diagnosed with virus.

## 2020-07-24 NOTE — Telephone Encounter (Signed)
CDC travel advice as below: if she is traveling by air they may require negative covid testing regardless of vaccination status.   Do NOT travel if...  You are sick. You tested positive for COVID-19. Do not travel until a full 10 days after your symptoms started or the date your positive test was taken if you had no symptoms. You are waiting for results of a COVID-19 test. You had close contact with a person with COVID-19 and are recommended to quarantine. Do not travel until a full 5 days after your last close contact with the person with COVID-19. It is best to avoid travel for a full 10 days after your last exposure. If you must travel during days 6 through 10 after your last exposure: Get tested at least 5 days after your last close contact. Make sure your test result is negative and you remain without symptoms before traveling. If you don't get tested, avoid travel until a full 10 days after your last close contact with a person with COVID-19. Properly wear a well-fitting mask when you are around others for the entire duration of travel during days 6 through 10. If you are unable to wear a mask, you should not travel during days 6 through 10. If you had close contact with a person with COVID-19 but are NOT recommended to quarantine...  Get tested at least 5 days after your last close contact. Make sure your test result is negative and you remain without symptoms before traveling. If you had confirmed COVID-19 within the past 90 days, you do NOT need to get tested, but you should still follow all other recommendations (including if you develop COVID-19 symptoms). If you travel during the 10 days after your last exposure, properly wear a well-fitting mask when you are around others for the entire duration of travel during the 10 days. If you are unable to wear a mask, you should not travel during the 10 days.

## 2020-07-25 NOTE — Telephone Encounter (Signed)
Responded to patient through my chart. KW 

## 2020-07-28 DIAGNOSIS — R0982 Postnasal drip: Secondary | ICD-10-CM | POA: Diagnosis not present

## 2020-07-28 DIAGNOSIS — Z8616 Personal history of COVID-19: Secondary | ICD-10-CM | POA: Diagnosis not present

## 2020-07-28 DIAGNOSIS — J029 Acute pharyngitis, unspecified: Secondary | ICD-10-CM | POA: Diagnosis not present

## 2020-07-28 DIAGNOSIS — J0140 Acute pansinusitis, unspecified: Secondary | ICD-10-CM | POA: Diagnosis not present

## 2020-08-04 DIAGNOSIS — N898 Other specified noninflammatory disorders of vagina: Secondary | ICD-10-CM | POA: Diagnosis not present

## 2020-08-04 DIAGNOSIS — Z3202 Encounter for pregnancy test, result negative: Secondary | ICD-10-CM | POA: Diagnosis not present

## 2020-08-08 ENCOUNTER — Telehealth: Payer: Self-pay

## 2020-08-08 DIAGNOSIS — F419 Anxiety disorder, unspecified: Secondary | ICD-10-CM | POA: Diagnosis not present

## 2020-08-08 DIAGNOSIS — I10 Essential (primary) hypertension: Secondary | ICD-10-CM | POA: Diagnosis not present

## 2020-08-08 NOTE — Telephone Encounter (Signed)
Copied from CRM [id number]. Topic: General - Other >> Aug 08, 2020  2:38 PM April Davenport A wrote: Reason for CRM: Patient is calling to notify of a potential request for their records from [facility]'s phone number is  [phone] Patient is hoping to have surgery scheduled within the next three to six months

## 2020-08-08 NOTE — Telephone Encounter (Signed)
Just be sure she has a release on file and we can send her office notes.

## 2020-08-09 ENCOUNTER — Other Ambulatory Visit: Payer: Self-pay | Admitting: Surgery

## 2020-08-09 ENCOUNTER — Other Ambulatory Visit (HOSPITAL_COMMUNITY): Payer: Self-pay | Admitting: Surgery

## 2020-08-09 NOTE — Telephone Encounter (Signed)
Called and spoke with patient to clarify she states that she needs her medical records from our office sent to [place] . April Davenport

## 2020-08-14 ENCOUNTER — Encounter: Payer: Self-pay | Admitting: Adult Health

## 2020-08-14 NOTE — Telephone Encounter (Signed)
Spoke with pt advising of what we need to send records. Pt stated understanding and will call back with any other concerns. TNP

## 2020-08-15 ENCOUNTER — Telehealth: Payer: Federal, State, Local not specified - PPO | Admitting: Adult Health

## 2020-08-16 ENCOUNTER — Telehealth: Payer: Federal, State, Local not specified - PPO | Admitting: Adult Health

## 2020-08-16 ENCOUNTER — Telehealth (INDEPENDENT_AMBULATORY_CARE_PROVIDER_SITE_OTHER): Payer: Federal, State, Local not specified - PPO | Admitting: Physician Assistant

## 2020-08-16 NOTE — Patient Instructions (Signed)

## 2020-08-16 NOTE — Progress Notes (Signed)
MyChart Video Visit    Virtual Visit via Video Note   This visit type was conducted due to national recommendations for restrictions regarding the COVID-19 Pandemic (e.g. social distancing) in an effort to limit this patient's exposure and mitigate transmission in our community. This patient is at least at moderate risk for complications without adequate follow up. This format is felt to be most appropriate for this patient at this time. Physical exam was limited by quality of the video and audio technology used for the visit.   Patient location: Home Provider location: Office  I discussed the limitations of evaluation and management by telemedicine and the availability of in person appointments. The patient expressed understanding and agreed to proceed.  Patient: April Davenport   DOB: 02/19/1991   29 y.o. Female  MRN: [id number] Visit Date: 08/16/2020  Today's healthcare provider: Trey Sailors, PA-C   Chief Complaint  Patient presents with  . Weight Loss  I,Adriana M Pollak,acting as a scribe for [employer], PA-C.,have documented all relevant documentation on the behalf of Trey Sailors, PA-C,as directed by  Trey Sailors, PA-C while in the presence of Trey Sailors, PA-C.  Subjective    HPI  Weight Loss Patient presents today for weight loss follow-up. She reports that she did not start the naltrexone-bupropion HCI ER 8-90 MG due to affordability. She has seen [facility] for bariatric surgery. She has been approved for gastric sleeve and needs a letter of intent.   Highest adult weight: 235 lbs 09/2019 Height: 5'0 Current Weight: 212 lbs  Previous Weight loss efforts  -calorie deficits for several months at a time -liquid diets  Wt Readings from Last 3 Encounters:  06/06/20 206 lb (93.4 kg)  05/07/20 203 lb 12.8 oz (92.4 kg)  03/20/20 197 lb 3.2 oz (89.4 kg)   Weight Related Health Issues -hypertension in pregnancy  -hypertension outside of  pregnancy: history of using blood pressure medications -prediabetes 2021  Previous medications for weight loss: -no prescription medications tried -supplements  -does not have sleep apnea     Medications: Outpatient Medications Prior to Visit  Medication Sig  . fluticasone (FLONASE ALLERGY RELIEF) 50 MCG/ACT nasal spray Place 2 sprays into both nostrils daily.  . cyclobenzaprine (FLEXERIL) 5 MG tablet Take 1 tablet (5 mg total) by mouth at bedtime. (Patient not taking: Reported on 08/16/2020)  . Naltrexone-buPROPion HCl ER 8-90 MG TB12 Start 1 tablet every morning for 7 days, then 1 tablet twice daily for 7 days, then 2 tablets every morning and one in the evening (Patient not taking: Reported on 08/16/2020)   No facility-administered medications prior to visit.    Review of Systems    Objective    LMP 08/08/2020    Physical Exam Constitutional:      Appearance: Normal appearance.  Pulmonary:     Effort: Pulmonary effort is normal. No respiratory distress.  Neurological:     Mental Status: She is alert.  Psychiatric:        Mood and Affect: Mood normal.        Behavior: Behavior normal.        Assessment & Plan    1. Morbid obesity (HCC)  She is seeing [facility] for bariatric surgery and needs a letter written. Will fax letter to bariatric surgery office.   Return if symptoms worsen or fail to improve.      I spent 20 minutes dedicated to the care of this patient on  the date of this encounter to include pre-visit review of records, face-to-face time with the patient discussing bariatric surgery, and post visit ordering of testing.   I discussed the assessment and treatment plan with the patient. The patient was provided an opportunity to ask questions and all were answered. The patient agreed with the plan and demonstrated an understanding of the instructions.   The patient was advised to call back or seek an in-person evaluation if the symptoms worsen  or if the condition fails to improve as anticipated.   ITrey Sailors, PA-C, have reviewed all documentation for this visit. The documentation on 08/23/20 for the exam, diagnosis, procedures, and orders are all accurate and complete.  The entirety of the information documented in the History of Present Illness, Review of Systems and Physical Exam were personally obtained by me. Portions of this information were initially documented by [facility] and reviewed by me for thoroughness and accuracy.    Maryella Shivers [facility] [phone] (phone) [phone] (fax)  [facility]

## 2020-08-20 ENCOUNTER — Other Ambulatory Visit: Payer: Self-pay

## 2020-08-20 ENCOUNTER — Ambulatory Visit (HOSPITAL_COMMUNITY)
Admission: RE | Admit: 2020-08-20 | Discharge: 2020-08-20 | Disposition: A | Discharge status: Home / Self Care | Payer: Federal, State, Local not specified - PPO | Source: Ambulatory Visit | Attending: Surgery | Admitting: Surgery

## 2020-08-20 DIAGNOSIS — Z9884 Bariatric surgery status: Secondary | ICD-10-CM | POA: Diagnosis not present

## 2020-08-20 DIAGNOSIS — Z01818 Encounter for other preprocedural examination: Secondary | ICD-10-CM | POA: Diagnosis not present

## 2020-08-23 NOTE — Progress Notes (Signed)
Office note and letter was faxed to [facility] in [place].

## 2020-08-29 IMAGING — US US MFM FETAL BPP W/O NON-STRESS
1 series · 13 of 28 positions shown · non-contrast
Comparison: none

[Series 1: us mfm fetal bpp w/o non-stress · 39 acquisitions, 13 frames shown]
[im 2/39]
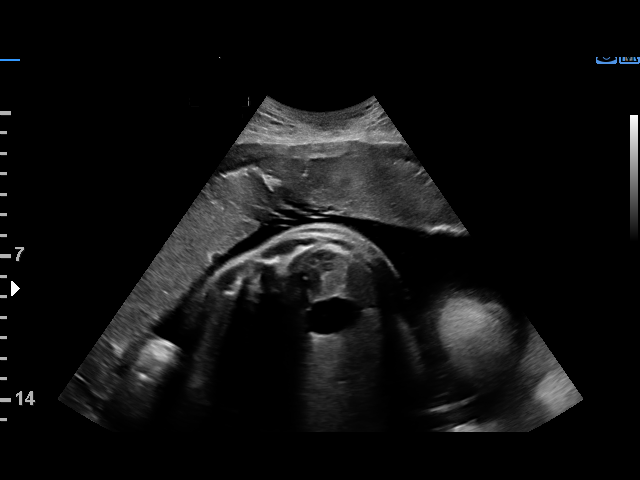
[im 5/39]
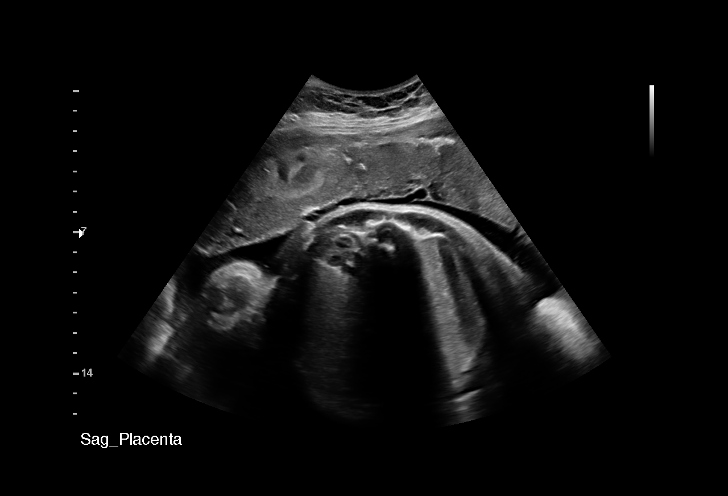
[im 8/39]
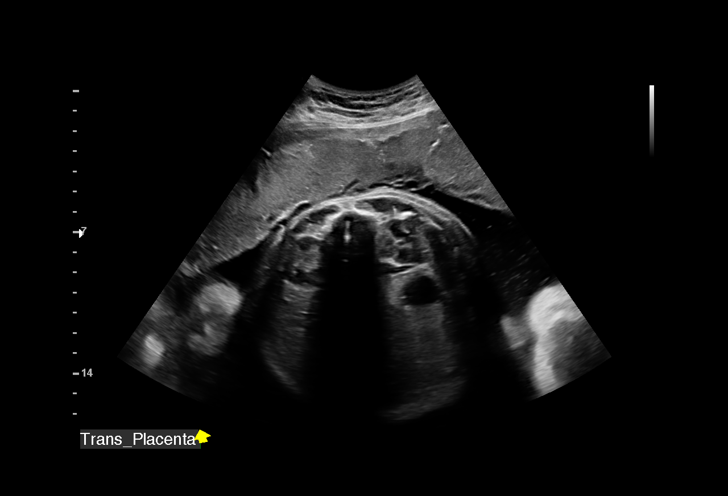
[im 10/39]
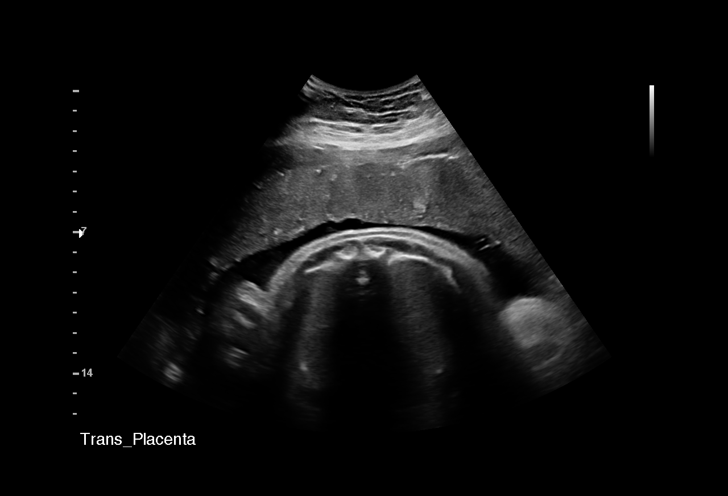
[im 13/39]
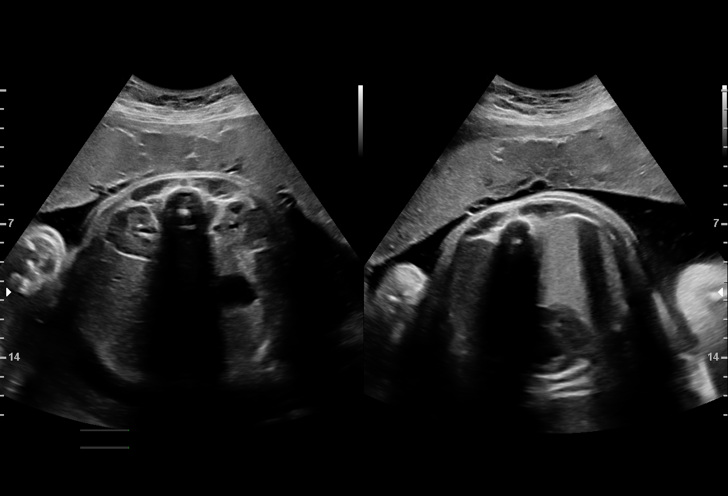
[im 16/39]
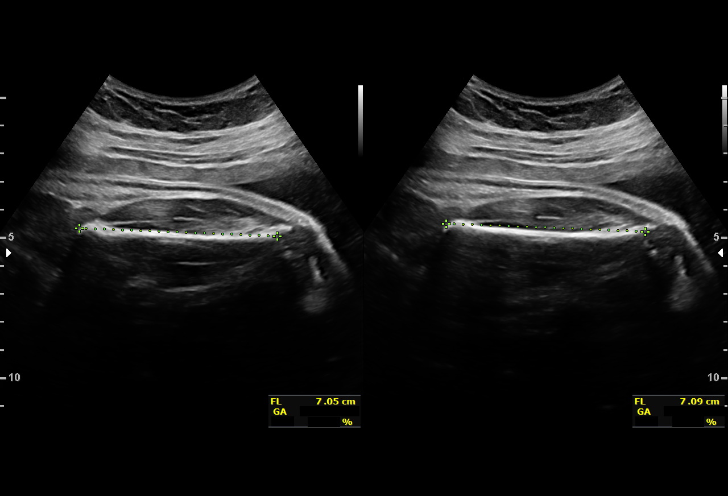
[im 20/39]
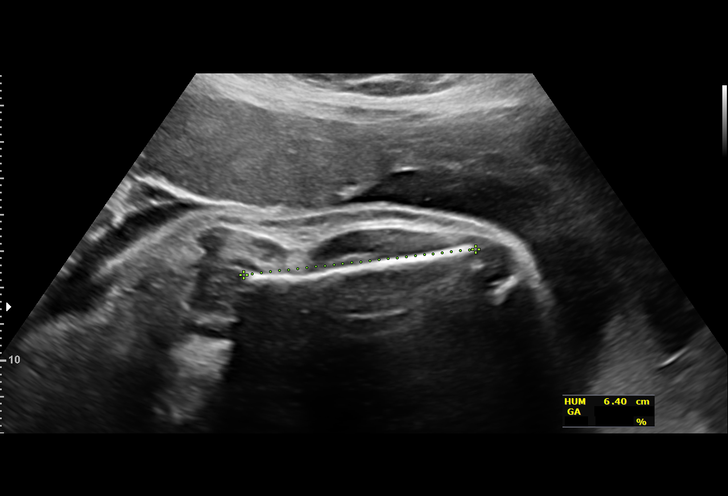
[im 23/39]
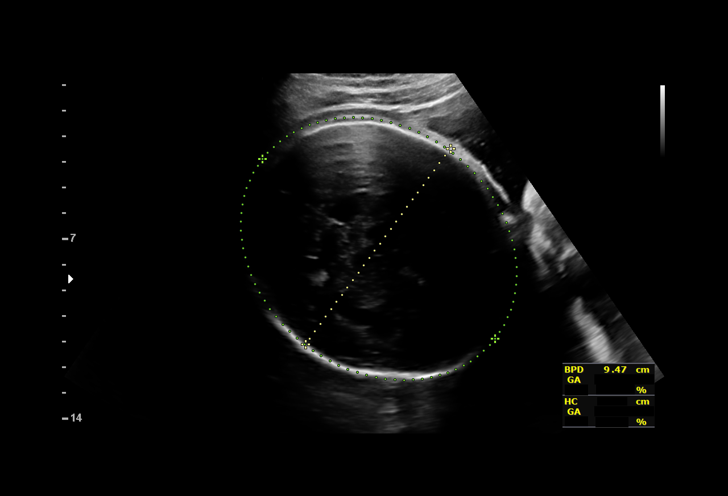
[im 26/39]
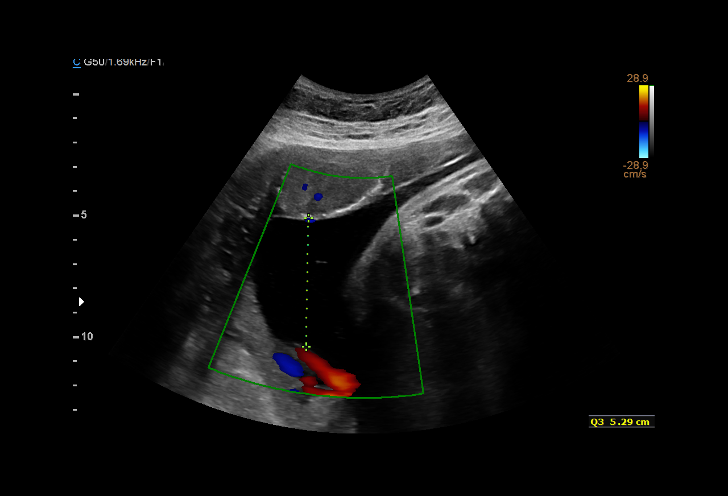
[im 29/39]
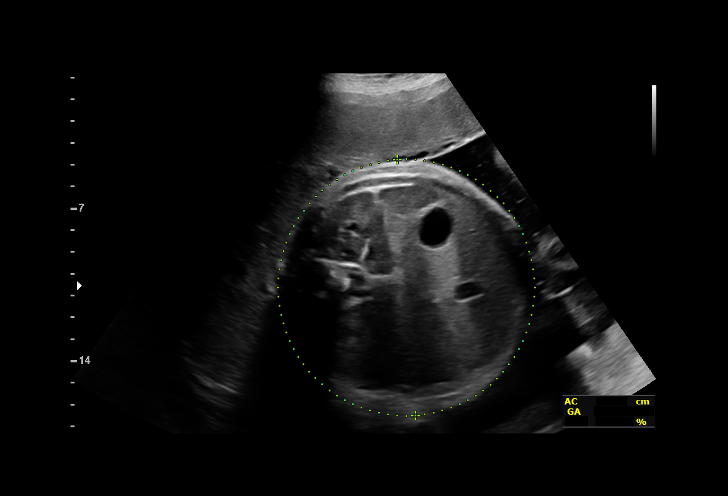
[im 31/39]
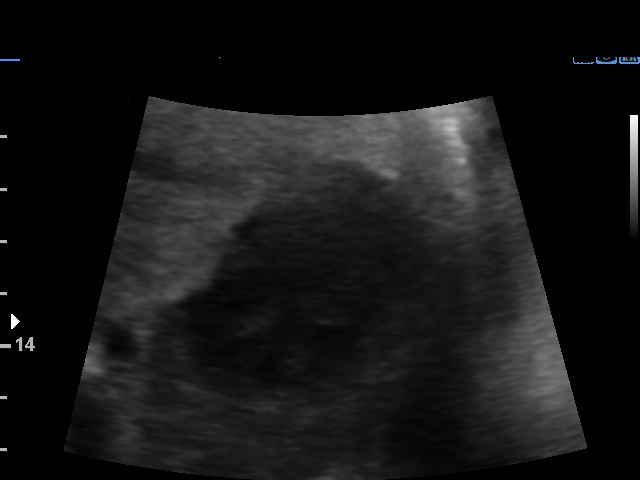
[im 34/39]
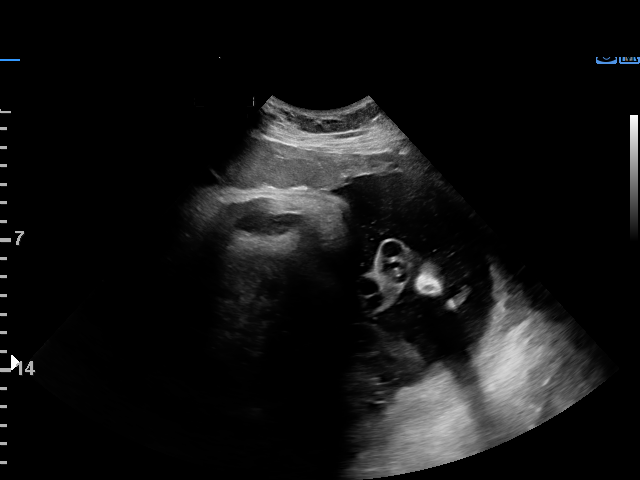
[im 37/39]
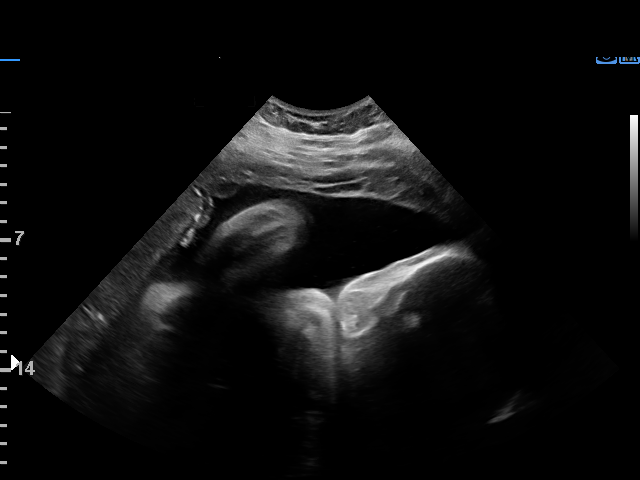

[13 of 28 positions shown; findings below may reference images not displayed]

----------------------------------------------------------------------

 ----------------------------------------------------------------------
Indications

  36 weeks gestation of pregnancy
  Encounter for other antenatal screening
  follow-up
  Hypertension - Chronic/Pre-existing
  (Labetalol)
  Polyhydramnios, third trimester, antepartum
  condition or complication, unspecified fetus
  Obesity complicating pregnancy, third
  trimester (BMI 39)
  History of cesarean delivery, currently
  pregnant
  Poor obstetric history: Previous preterm
  delivery, antepartum (32 wks due to severe
  preeclampsia)
  Poor obstetric history: Previous
  preeclampsia / eclampsia/gestational HTN
  Low Risk NIPS
  Negative Horizon
 ----------------------------------------------------------------------
Vital Signs

                                                Height:        5'1"
Fetal Evaluation

 Num Of Fetuses:         1
 Fetal Heart Rate(bpm):  164
 Cardiac Activity:       Observed
 Presentation:           Cephalic
 Placenta:               Anterior
 P. Cord Insertion:      Previously Visualized

 Amniotic Fluid
 AFI FV:      Within normal limits

 AFI Sum(cm)     %Tile       Largest Pocket(cm)
 18.47           69

 RUQ(cm)       RLQ(cm)       LUQ(cm)        LLQ(cm)
 3.78          4
Biophysical Evaluation

 Amniotic F.V:   Within normal limits       F. Tone:        Observed
 F. Movement:    Observed                   Score:          [DATE]
 F. Breathing:   Observed
Biometry

 BPD:        94  mm     G. Age:  38w 2d         96  %    CI:        80.99   %    70 - 86
                                                         FL/HC:      21.6   %    20.1 -
 HC:      329.8  mm     G. Age:  37w 4d         54  %    HC/AC:      0.89        0.93 -
 AC:      368.5  mm     G. Age:  40w 5d       > 99  %    FL/BPD:     75.6   %    71 - 87
 FL:       71.1  mm     G. Age:  36w 3d         54  %    FL/AC:      19.3   %    20 - 24
 HUM:      64.2  mm     G. Age:  37w 2d         84  %

 Est. FW:    3828  gm      8 lb 3 oz   > 99  %
OB History

 Gravidity:    3         Term:   0        Prem:   1        SAB:   1
 TOP:          0       Ectopic:  0        Living: 1
Gestational Age

 LMP:           36w 1d        Date:  10/06/18                 EDD:   07/13/19
 U/S Today:     38w 2d                                        EDD:   06/28/19
 Best:          36w 1d     Det. By:  LMP  (10/06/18)          EDD:   07/13/19
Anatomy

 Cranium:               Previously seen        LVOT:                   Previously seen
 Cavum:                 Previously seen        Aortic Arch:            Previously seen
 Ventricles:            Previously seen        Ductal Arch:            Previously seen
 Choroid Plexus:        Previously seen        Diaphragm:              Previously seen
 Cerebellum:            Previously seen        Stomach:                Appears normal, left
                                                                       sided
 Posterior Fossa:       Previously seen        Abdomen:                Previously seen
 Nuchal Fold:           Previously seen        Abdominal Wall:         Previously seen
 Face:                  Orbits and profile     Cord Vessels:           Previously seen
                        previously seen
 Lips:                  Previously seen        Kidneys:                Appear normal
 Palate:                Previously seen        Bladder:                Appears normal
 Thoracic:              Appears normal         Spine:                  Previously seen
 Heart:                 Previously seen        Upper Extremities:      Previously seen
 RVOT:                  Previously seen        Lower Extremities:      Previously seen
Cervix Uterus Adnexa
 Cervix
 Not visualized (advanced GA >47wks)
Impression

 Chronic hypertension.  Blood pressure well controlled on
 labetalol 100 mg twice daily.  BP today at our office is 127/76
 mmHg.  Patient does not have symptoms of severe features
 of preeclampsia.

 On ultrasound, the estimated fetal weight is at greater than
 the 99th percentile.  Amniotic fluid is normal good fetal
 activity seen. Antenatal testing is reassuring. BPP [DATE].

 Patient wanted to know by the timing of delivery and would
 like to be delivered at 37 weeks.  Informed her if her blood
 pressures of well controlled she can deliver at 39 weeks that
 improves neonatal outcomes.  Risk of superimposed
 preeclampsia is increased after 38 weeks gestation.  Patient
 would like to be delivered at 38 weeks gestation.

 She prefers repeat elective cesarean section.
Recommendations

 -BPP next week.
 -Consider delivery at 38 weeks.
                 [place], Ndieve

## 2020-09-12 ENCOUNTER — Encounter: Payer: Self-pay | Admitting: Skilled Nursing Facility1

## 2020-09-12 ENCOUNTER — Other Ambulatory Visit: Payer: Self-pay

## 2020-09-12 ENCOUNTER — Encounter: Payer: Federal, State, Local not specified - PPO | Attending: Surgery | Admitting: Skilled Nursing Facility1

## 2020-09-12 DIAGNOSIS — E669 Obesity, unspecified: Secondary | ICD-10-CM

## 2020-09-12 NOTE — Progress Notes (Signed)
Nutrition Assessment for Bariatric Surgery Medical Nutrition Therapy Appt Start Time:  8:11 End Time: 9:07  Patient was seen on 09/12/2020 for Pre-Operative Nutrition Assessment. Letter of approval faxed to [facility] bariatric surgery program coordinator on 09/12/2020.   Referral stated Supervised Weight Loss (SWL) visits needed: 3  Planned surgery: sleeve gastrectomy Pt expectation of surgery: to be healthy Pt expectation of dietitian: none reported     NUTRITION ASSESSMENT   Anthropometrics  Start weight at [facility]: 208.4 lbs (date: 09/12/2020)  Height: 61 in BMI: 39.38 kg/m2     Clinical  Medical hx: N/A Medications: N/A  Labs:  Notable signs/symptoms: sciatic nerve  Any previous deficiencies? Iron  Micronutrient Nutrition Focused Physical Exam: Hair: No issues observed Eyes: No issues observed Mouth: No issues observed Neck: No issues observed Nails: No issues observed Skin: No issues observed  Lifestyle & Dietary Hx  Pt states she does not have a weight loss goal in mind she just wants a healthier future.  Pt states her fiancee is supportive. Pt states she understands she needs to make changes with her diet.   24-Hr Dietary Recall First Meal: skipped Snack: graham crackers Second Meal: fast food Snack: fruit Third Meal: skipped Snack:  Beverages: 1-2 16 oz lemonade, fruit punch, water, sprite   Estimated Energy Needs Calories: 1500   NUTRITION DIAGNOSIS  Overweight/obesity ([place]-3.3) related to past poor dietary habits and physical inactivity as evidenced by patient w/ planned sleeve gastrectomy surgery following dietary guidelines for continued weight loss.    NUTRITION INTERVENTION  Nutrition counseling (C-1) and education (E-2) to facilitate bariatric surgery goals.   Pre-Op Goals Reviewed with the Patient . Track food and beverage intake (pen and paper, MyFitness Pal, Baritastic app, etc.) . Make healthy food choices while monitoring  portion sizes . Consume 3 meals per day or try to eat every 3-5 hours: do not worry so much about portion sizes or what it is just focus one this one step and eat more often throughout the day: follow detailed myplate with meal ideas on it . Avoid concentrated sugars and fried foods . Keep sugar & fat in the single digits per serving on food labels . Practice CHEWING your food (aim for applesauce consistency) . Practice not drinking 15 minutes before, during, and 30 minutes after each meal and snack . Avoid all carbonated beverages (ex: soda, sparkling beverages)  . Limit caffeinated beverages (ex: coffee, tea, energy drinks) . Avoid all sugar-sweetened beverages (ex: regular soda, sports drinks)  . Avoid alcohol  . Aim for 64-100 ounces of FLUID daily (with at least half of fluid intake being plain water)  . Aim for at least 60-80 grams of PROTEIN daily . Look for a liquid protein source that contains ?15 g protein and ?5 g carbohydrate (ex: shakes, drinks, shots) . Make a list of non-food related activities . Physical activity is an important part of a healthy lifestyle so keep it moving! The goal is to reach 150 minutes of exercise per week, including cardiovascular and weight baring activity.  *Goals that are bolded indicate the pt would like to start working towards these  Handouts Provided Include  . Bariatric Surgery handouts (Nutrition Visits, Pre-Op Goals, Protein Shakes, Vitamins & Minerals) . Detailed MyPlate  Learning Style & Readiness for Change Teaching method utilized: Visual & Auditory  Demonstrated degree of understanding via: Teach Back  Readiness Level: contemplative  Barriers to learning/adherence to lifestyle change: none identified   RD's Notes for Next  Visit . Assess pts adherence to chosen goals    MONITORING & EVALUATION Dietary intake, weekly physical activity, body weight, and pre-op goals reached at next nutrition visit.    Next Steps  Patient is to  follow up at [facility] for Pre-Op Class >2 weeks before surgery for further nutrition education.  Return for next SWL

## 2020-09-17 ENCOUNTER — Telehealth (INDEPENDENT_AMBULATORY_CARE_PROVIDER_SITE_OTHER): Payer: Federal, State, Local not specified - PPO | Admitting: Adult Health

## 2020-09-17 ENCOUNTER — Encounter: Payer: Self-pay | Admitting: Adult Health

## 2020-09-17 DIAGNOSIS — Z5329 Procedure and treatment not carried out because of patient's decision for other reasons: Secondary | ICD-10-CM

## 2020-09-17 NOTE — Progress Notes (Signed)
    MyChart Video Visit    Virtual Visit via Video Note   No show for virtual was not online at 1120  To 1149 am. Will need to reschedule.

## 2020-09-18 ENCOUNTER — Encounter: Payer: Self-pay | Admitting: Adult Health

## 2020-09-26 ENCOUNTER — Encounter: Payer: Self-pay | Admitting: Adult Health

## 2020-09-26 ENCOUNTER — Telehealth (INDEPENDENT_AMBULATORY_CARE_PROVIDER_SITE_OTHER): Payer: Federal, State, Local not specified - PPO | Admitting: Adult Health

## 2020-09-26 DIAGNOSIS — Z6838 Body mass index (BMI) 38.0-38.9, adult: Secondary | ICD-10-CM

## 2020-09-26 DIAGNOSIS — B369 Superficial mycosis, unspecified: Secondary | ICD-10-CM | POA: Diagnosis not present

## 2020-09-26 DIAGNOSIS — E669 Obesity, unspecified: Secondary | ICD-10-CM

## 2020-09-26 MED ORDER — KETOCONAZOLE 2 % EX CREA: 1.0000 "application " | TOPICAL_CREAM | Freq: Two times a day (BID) | CUTANEOUS | 0 refills | Status: AC

## 2020-09-26 NOTE — Patient Instructions (Signed)
Nature conservation officer at [employer]: [address] [place], [place] [address] Phone: [phone] Ketoconazole skin cream What is this medicine? KETOCONAZOLE (kee toe KON na zole) is an antifungal medicine. It is used on the skin. It treats skin infections caused by fungus. This medicine may be used for other purposes; ask your health care provider or pharmacist if you have questions. COMMON BRAND NAME(S): Kuric, Nizoral What should I tell my health care provider before I take this medicine? They need to know if you have any of these conditions:  large areas of burned or damaged skin  an unusual or allergic reaction to ketoconazole, other medicines, foods, dyes, or preservatives  pregnant or trying to get pregnant  breast-feeding How should I use this medicine? This medicine is for external use only. Do not take by mouth. Wash your hands before and after use. If you are treating your hands, only wash your hands before use. Do not use on healthy skin or over large areas of skin. Do not get this medicine in your eyes. If you do, rinse it out with plenty of cool tap water. Use it as directed on the label at the same time every day. Do not use it more often than directed or for a longer time period than prescribed by your health care provider. Keep taking it unless your health care provider tells you to stop. Apply a thin film to the affected area and rub gently. Do not bandage or wrap the skin being treated unless directed to do so by your doctor or health care provider. Talk to your health care provider about the use of this medicine in children. Special care may be needed. Overdosage: If you think you have taken too much of this medicine contact a poison control center or emergency room at once. NOTE: This medicine is only for you. Do not share this medicine with others. What if I miss a dose? If you miss a dose, use it as soon as you can. If it is almost time for your next  dose, use only that dose. Do not use double or extra doses. What may interact with this medicine? Interactions are not expected. Do not use any other skin products without telling your doctor or health care professional. This list may not describe all possible interactions. Give your health care provider a list of all the medicines, herbs, non-prescription drugs, or dietary supplements you use. Also tell them if you smoke, drink alcohol, or use illegal drugs. Some items may interact with your medicine. What should I watch for while using this medicine? Visit your health care provider for regular checks on your progress. Tell your health care provider if your symptoms do not start to get better or if they get worse. After bathing, make sure your skin is very dry. Fungal infections like moist conditions. Do not walk around barefoot. To help prevent reinfection, wear freshly washed cotton, not synthetic, clothing. Tell your health care provider if you develop sores or blisters that do not heal properly. If your skin infection returns after you stop using this medicine, contact your health care provider. If you are using this medicine for jock itch, do not wear underwear that is tight-fitting or made from synthetic fibers such as rayon or nylon. Instead, wear loose-fitting, cotton underwear. Dry the area completely after bathing. If you are using this medicine to treat athlete's foot, carefully dry the feet, especially between the toes after bathing. Do not wear socks made from  wool or synthetic materials such as rayon or nylon. Wear clean cotton socks and change them at least once a day. Wear sandals or shoes that are well-ventilated. An absorbent powder, such as talcum powder, may be used to keep the skin dry. Apply the powder to the affected skin in between applications of this medicine. What side effects may I notice from receiving this medicine? Side effects that you should report to your doctor or  health care provider as soon as possible:  allergic reactions (skin rash, itching or hives; swelling of the face, lips, or tongue)  burning, crusting, itching, stinging, swelling, tenderness of skin Side effects that usually do not require medical attention (report to your doctor or health care provider if they continue or are bothersome):  mild skin irritation, redness, or dryness This list may not describe all possible side effects. Call your doctor for medical advice about side effects. You may report side effects to FDA at 1-800-FDA-1088. Where should I keep my medicine? Keep out of the reach of children and pets. Store at room temperature between 20 and 25 degrees C (68 and 77 degrees F). Get rid of any unused medicine after the expiration date. NOTE: This sheet is a summary. It may not cover all possible information. If you have questions about this medicine, talk to your doctor, pharmacist, or health care provider.  2021 Elsevier/Gold Standard (2019-06-09 11:43:46)  Intertrigo Intertrigo is skin irritation (inflammation) that happens in warm, moist areas of the body. The irritation can cause a rash and make skin raw and itchy. The rash is usually pink or red. It happens mostly between folds of skin or where skin rubs together, such as:  Between the toes.  In the armpits.  In the groin area.  Under the belly.  Under the breasts.  Around the butt area. This condition is not passed from person to person (is not contagious). What are the causes?  Heat, moisture, rubbing, and not enough air movement.  The condition can be made worse by: ? Sweat. ? Bacteria. ? A fungus, such as yeast. What increases the risk?  Moisture in your skin folds.  You are more likely to develop this condition if you: ? Have diabetes. ? Are overweight. ? Are not able to move around. ? Live in a warm and moist climate. ? Wear splints, braces, or other medical devices. ? Are not able to control  your pee (urine) or poop (stool). What are the signs or symptoms?  A pink or red skin rash in the skin fold or near the skin fold.  Raw or scaly skin.  Itching.  A burning feeling.  Bleeding.  Leaking fluid.  A bad smell. How is this treated?  Cleaning and drying your skin.  Taking an antibiotic medicine or using an antibiotic skin cream for a bacterial infection.  Using an antifungal cream on your skin or taking pills for an infection that was caused by a fungus, such as yeast.  Using a steroid ointment to stop the itching and irritation.  Separating the skin fold with a clean cotton cloth to absorb moisture and allow air to flow into the area. Follow these instructions at home:  Keep the affected area clean and dry.  Do not scratch your skin.  Stay cool as much as you can. Use an air conditioner or a fan, if you have one.  Apply over-the-counter and prescription medicines only as told by your doctor.  If you were prescribed an antibiotic  medicine, use it as told by your doctor. Do not stop using the antibiotic even if your condition starts to get better.  Keep all follow-up visits as told by your doctor. This is important. How is this prevented?  Stay at a healthy weight.  Take care of your feet. This is very important if you have diabetes. You should: ? Wear shoes that fit well. ? Keep your feet dry. ? Wear clean cotton or wool socks.  Protect the skin in your groin and butt area as told by your doctor. To do this: ? Follow a regular cleaning routine. ? Use creams, powders, or ointments that protect your skin. ? Change protection pads often.  Do not wear tight clothes. Wear clothes that: ? Are loose. ? Take moisture away from your body. ? Are made of cotton.  Wear a bra that gives good support, if needed.  Shower and dry yourself well after being active. Use a hair dryer on a cool setting to dry between skin folds.  Keep your blood sugar under control  if you have diabetes.   Contact a doctor if:  Your symptoms do not get better with treatment.  Your symptoms get worse or they spread.  You notice more redness and warmth.  You have a fever. Summary  Intertrigo is skin irritation that occurs when folds of skin rub together.  This condition is caused by heat, moisture, and rubbing.  This condition may be treated by cleaning and drying your skin and with medicines.  Apply over-the-counter and prescription medicines only as told by your doctor.  Keep all follow-up visits as told by your doctor. This is important. This information is not intended to replace advice given to you by your health care provider. Make sure you discuss any questions you have with your health care provider. Document Revised: 03/04/2018 Document Reviewed: 03/04/2018 Elsevier Patient Education  2021 Elsevier Inc.   Calorie Counting for [employer] Calories are units of energy. Your body needs a certain number of calories from food to keep going throughout the day. When you eat or drink more calories than your body needs, your body stores the extra calories mostly as fat. When you eat or drink fewer calories than your body needs, your body burns fat to get the energy it needs. Calorie counting means keeping track of how many calories you eat and drink each day. Calorie counting can be helpful if you need to lose weight. If you eat fewer calories than your body needs, you should lose weight. Ask your health care provider what a healthy weight is for you. For calorie counting to work, you will need to eat the right number of calories each day to lose a healthy amount of weight per week. A dietitian can help you figure out how many calories you need in a day and will suggest ways to reach your calorie goal.  A healthy amount of weight to lose each week is usually 1-2 lb (0.5-0.9 kg). This usually means that your daily calorie intake should be reduced by 500-750  calories.  Eating 1,200-1,500 calories a day can help most women lose weight.  Eating 1,500-1,800 calories a day can help most men lose weight. What do I need to know about calorie counting? Work with your health care provider or dietitian to determine how many calories you should get each day. To meet your daily calorie goal, you will need to:  Find out how many calories are in each food that  you would like to eat. Try to do this before you eat.  Decide how much of the food you plan to eat.  Keep a food log. Do this by writing down what you ate and how many calories it had. To successfully lose weight, it is important to balance calorie counting with a healthy lifestyle that includes regular activity. Where do I find calorie information? The number of calories in a food can be found on a Nutrition Facts label. If a food does not have a Nutrition Facts label, try to look up the calories online or ask your dietitian for help. Remember that calories are listed per serving. If you choose to have more than one serving of a food, you will have to multiply the calories per serving by the number of servings you plan to eat. For example, the label on a package of bread might say that a serving size is 1 slice and that there are 90 calories in a serving. If you eat 1 slice, you will have eaten 90 calories. If you eat 2 slices, you will have eaten 180 calories.   How do I keep a food log? After each time that you eat, record the following in your food log as soon as possible:  What you ate. Be sure to include toppings, sauces, and other extras on the food.  How much you ate. This can be measured in cups, ounces, or number of items.  How many calories were in each food and drink.  The total number of calories in the food you ate. Keep your food log near you, such as in a pocket-sized notebook or on an app or website on your mobile phone. Some programs will calculate calories for you and show you how  many calories you have left to meet your daily goal. What are some portion-control tips?  Know how many calories are in a serving. This will help you know how many servings you can have of a certain food.  Use a measuring cup to measure serving sizes. You could also try weighing out portions on a kitchen scale. With time, you will be able to estimate serving sizes for some foods.  Take time to put servings of different foods on your favorite plates or in your favorite bowls and cups so you know what a serving looks like.  Try not to eat straight from a food's packaging, such as from a bag or box. Eating straight from the package makes it hard to see how much you are eating and can lead to overeating. Put the amount you would like to eat in a cup or on a plate to make sure you are eating the right portion.  Use smaller plates, glasses, and bowls for smaller portions and to prevent overeating.  Try not to multitask. For example, avoid watching TV or using your computer while eating. If it is time to eat, sit down at a table and enjoy your food. This will help you recognize when you are full. It will also help you be more mindful of what and how much you are eating. What are tips for following this plan? Reading food labels  Check the calorie count compared with the serving size. The serving size may be smaller than what you are used to eating.  Check the source of the calories. Try to choose foods that are high in protein, fiber, and vitamins, and low in saturated fat, trans fat, and sodium. Shopping  Read nutrition  labels while you shop. This will help you make healthy decisions about which foods to buy.  Pay attention to nutrition labels for low-fat or fat-free foods. These foods sometimes have the same number of calories or more calories than the full-fat versions. They also often have added sugar, starch, or salt to make up for flavor that was removed with the fat.  Make a grocery list of  lower-calorie foods and stick to it. Cooking  Try to cook your favorite foods in a healthier way. For example, try baking instead of frying.  Use low-fat dairy products. Meal planning  Use more fruits and vegetables. One-half of your plate should be fruits and vegetables.  Include lean proteins, such as chicken, [place], and fish. Lifestyle Each week, aim to do one of the following:  150 minutes of moderate exercise, such as walking.  75 minutes of vigorous exercise, such as running. General information  Know how many calories are in the foods you eat most often. This will help you calculate calorie counts faster.  Find a way of tracking calories that works for you. Get creative. Try different apps or programs if writing down calories does not work for you. What foods should I eat?  Eat nutritious foods. It is better to have a nutritious, high-calorie food, such as an avocado, than a food with few nutrients, such as a bag of potato chips.  Use your calories on foods and drinks that will fill you up and will not leave you hungry soon after eating. ? Examples of foods that fill you up are nuts and nut butters, vegetables, lean proteins, and high-fiber foods such as whole grains. High-fiber foods are foods with more than 5 g of fiber per serving.  Pay attention to calories in drinks. Low-calorie drinks include water and unsweetened drinks. The items listed above may not be a complete list of foods and beverages you can eat. Contact a dietitian for more information.   What foods should I limit? Limit foods or drinks that are not good sources of vitamins, minerals, or protein or that are high in unhealthy fats. These include:  Candy.  Other sweets.  Sodas, specialty coffee drinks, alcohol, and juice. The items listed above may not be a complete list of foods and beverages you should avoid. Contact a dietitian for more information. How do I count calories when eating out?  Pay  attention to portions. Often, portions are much larger when eating out. Try these tips to keep portions smaller: ? Consider sharing a meal instead of getting your own. ? If you get your own meal, eat only half of it. Before you start eating, ask for a container and put half of your meal into it. ? When available, consider ordering smaller portions from the menu instead of full portions.  Pay attention to your food and drink choices. Knowing the way food is cooked and what is included with the meal can help you eat fewer calories. ? If calories are listed on the menu, choose the lower-calorie options. ? Choose dishes that include vegetables, fruits, whole grains, low-fat dairy products, and lean proteins. ? Choose items that are boiled, broiled, grilled, or steamed. Avoid items that are buttered, battered, fried, or served with cream sauce. Items labeled as crispy are usually fried, unless stated otherwise. ? Choose water, low-fat milk, unsweetened iced tea, or other drinks without added sugar. If you want an alcoholic beverage, choose a lower-calorie option, such as a glass of wine or  light beer. ? Ask for dressings, sauces, and syrups on the side. These are usually high in calories, so you should limit the amount you eat. ? If you want a salad, choose a garden salad and ask for grilled meats. Avoid extra toppings such as bacon, cheese, or fried items. Ask for the dressing on the side, or ask for olive oil and vinegar or lemon to use as dressing.  Estimate how many servings of a food you are given. Knowing serving sizes will help you be aware of how much food you are eating at restaurants. Where to find more information  Centers for Disease Control and Prevention: FootballExhibition.com.br  [place] Department of Agriculture: WrestlingReporter.dk Summary  Calorie counting means keeping track of how many calories you eat and drink each day. If you eat fewer calories than your body needs, you should lose weight.  A  healthy amount of weight to lose per week is usually 1-2 lb (0.5-0.9 kg). This usually means reducing your daily calorie intake by 500-750 calories.  The number of calories in a food can be found on a Nutrition Facts label. If a food does not have a Nutrition Facts label, try to look up the calories online or ask your dietitian for help.  Use smaller plates, glasses, and bowls for smaller portions and to prevent overeating.  Use your calories on foods and drinks that will fill you up and not leave you hungry shortly after a meal. This information is not intended to replace advice given to you by your health care provider. Make sure you discuss any questions you have with your health care provider. Document Revised: 07/07/2019 Document Reviewed: 07/07/2019 Elsevier Patient Education  2021 [employer].

## 2020-09-26 NOTE — Progress Notes (Signed)
MyChart Telephone Visit    Virtual Visit via Telephone  Note   This visit type was conducted due to national recommendations for restrictions regarding the COVID-19 Pandemic (e.g. social distancing) in an effort to limit this patient's exposure and mitigate transmission in our community. This patient is at least at moderate risk for complications without adequate follow up. This format is felt to be most appropriate for this patient at this time. Physical exam was limited by quality of the video and audio technology used for the visit.  Parties involved in visit as below:   Patient location: at home  Provider location: Provider: Provider's office at  [facility], [place]. I discussed the limitations of evaluation and management by telemedicine and the availability of in person appointments. The patient expressed understanding and agreed to proceed.  Patient: April Davenport   DOB: 01-16-1991   29 y.o. Female  MRN: [id number] Visit Date: 09/26/2020  Today's healthcare provider: Jairo BenMichelle Smith Trypp Heckmann, FNP   Chief Complaint  Patient presents with  . Obesity   Subjective    HPI HPI    Patient presents today to discuss need for bariatric surgery. Patient states that [facility] and is seeing a nutriontist, patient states current weight is 208lbs and she is 5'1. Patient states that she is following a healthy diet and is staying active exercising 2x a week. Patient states that she wold like to get gastric sleeve and is looking at having [facility] .   Last edited by Fonda KinderWolford, Kathleen J, CMA on 09/26/2020 10:37 AM. (History)     [facility] already and doing pre- assessment requirements. She does report itching area bilateral groin crease she has had for a few weeks and a rash that appears darker in color affecting that area only. Intense itching has used hydrocortisone  cream. Has been to urgent care tested for STD was negative. Not worsening staying the same. Denies any recent new medications, products or illness.   Patient  denies any fever, body aches,chills, chest pain, shortness of breath, nausea, vomiting, or diarrhea.  Denies dizziness, lightheadedness, pre syncopal or syncopal episodes.    referral has already been placed as below:   Referral Referral # [id number]  Referral Information  Referral # Creation Date Referral Status Status Update  [id number] 06/06/2020 Closed 06/06/2020: Status History   Status Reason Referral Type Referral Reasons Referral Class  Visits Completed Consultation none Outgoing   To Specialty To Provider To Location/Place of Service To Department  General Surgery Luretha MurphyMartin, Matthew, MD none none   To Vendor Referred By By Location/Place of Service By Department  none Kiyani Jernigan, Eula FriedMichelle S, FNP [facility]   Priority Start Date Expiration Date Referral Entered By  Routine 06/06/2020 06/06/2021 FlinchumEula Fried, Adaora Mchaney S, FNP   Visits Requested Visits Authorized Visits Completed Visits Scheduled  1 1 1      Procedure Information  Service Details Procedure Modifiers Provider Requested Approved  [id number] - AMB REFERRAL TO BARIATRIC SURGERY None  1 1   Diagnosis Information  Diagnosis  R63.5 (ICD-10-CM) - Weight gain  E66.9,Z68.38 (ICD-10-CM) - Class 2 obesity without serious comorbidity with body mass index (BMI) of 38.0 to 38.9 in adult, unspecified obesity type  Z98.51 (ICD-10-CM) - History of bilateral tubal ligation  F41.1 (ICD-10-CM) - Generalized anxiety disorder  I10 (ICD-10-CM) - Hypertension, unspecified type    Referral Notes Number of  Notes: 3  . Type Date User Summary Attachment  General 06/25/2020 11:43 AM Louie Bun, Rosey Bath D - -  Note   Thu 07/19/20 09:30 AM  Fredricka Bonine, CHELSEA A      This external appointment will be added to the referral counts when the  record is saved.     User: Margarito Courser    Comments:  [facility]     Address [address] [place], [place], [place] [address]  Phone Number [phone]  Fax Number [phone]           Patient Active Problem List   Diagnosis Date Noted  . Weight gain 06/06/2020  . Viral upper respiratory tract infection 03/20/2020  . Anxiety 03/20/2020  . History of bilateral tubal ligation 11/10/2019  . Abdominal cramping 11/10/2019  . Lactational amenorrhea 11/10/2019  . 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 Social anxiety disorder 10/20/2019  . Generalized anxiety disorder 10/06/2019  . Postpartum depression 10/06/2019  . History of hyperthyroidism 09/13/2019  . Breast feeding status of mother 09/13/2019  . Vitamin D deficiency 09/13/2019  . Stress headaches 09/13/2019  . Prediabetes 09/13/2019  . Acute non-recurrent frontal sinusitis 09/13/2019  . Seasonal allergies 09/13/2019  . Anemia 09/13/2019  . Acute serous otitis media of left ear 09/13/2019  . Low TSH level 12/13/2018  . History of pre-eclampsia 12/09/2018  . Class 2 obesity without serious comorbidity with body mass index (BMI) of 38.0 to 38.9 in adult 12/09/2018  . History of kidney stones 03/18/2018  . Obesity, Class III, BMI 40-49.9 (morbid obesity) (HCC) 03/18/2018  . IUD (intrauterine device) in place 08/19/2013  . Hypertension 06/27/2013  . Routine health maintenance 06/27/2013  . Postpartum care following cesarean delivery 06/27/2013  . Cesarean delivery delivered 05/30/2013  . Preterm delivery, delivered 05/30/2013  . Antepartum mild preeclampsia 05/15/2013  . Encounter for supervision of normal pregnancy in multigravida 01/20/2013  . Family history of diabetes mellitus type II 01/20/2013  . BMI 36.0-36.9,adult 12/15/2012  . LGSIL (low grade squamous intraepithelial lesion) on Pap smear 01/05/2012  . Cervical high risk human papillomavirus (HPV) DNA test positive  11/20/2011  . 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 Allergy   . Anemia   . Anxiety   . Depression   . Hypertension   . Lactating mother   . Right nephrolithiasis    Past Surgical History:  Procedure Laterality Date  . CESAREAN SECTION  05/16/2013  . CESAREAN SECTION WITH BILATERAL TUBAL LIGATION Bilateral 06/30/2019   Procedure: CESAREAN SECTION WITH BILATERAL TUBAL LIGATION;  Surgeon: Tereso Newcomer, MD;  Location: [facility] LD ORS;  Service: Obstetrics;  Laterality: Bilateral;  . KNEE SURGERY Right    Social History   Tobacco Use  . Smoking status: Never Smoker  . Smokeless tobacco: Never Used  Vaping Use  . Vaping Use: Never used  Substance Use Topics  . Alcohol use: Not Currently    Comment: rare  . Drug use: Never   Social History   Socioeconomic History  . Marital status: Single    Spouse name: Not on file  . Number of children: Not on file  . Years of education: Not on file  . Highest education level: Not on file  Occupational History  . Not on file  Tobacco Use  . Smoking status: Never Smoker  . Smokeless tobacco: Never Used  Vaping Use  . Vaping Use: Never used  Substance and Sexual  Activity  . Alcohol use: Not Currently    Comment: rare  . Drug use: Never  . Sexual activity: Yes    Birth control/protection: None, Surgical    Comment: tubal ligation   Other Topics Concern  . Not on file  Social History Narrative  . Not on file   Social Determinants of Health   Financial Resource Strain: Not on file  Food Insecurity: Not on file  Transportation Needs: Not on file  Physical Activity: Not on file  Stress: Not on file  Social Connections: Not on file  Intimate Partner Violence: Not on file   Family Status  Relation Name Status  . Mother  Alive  . Father  Deceased  . MGM  Deceased  . MGF  Deceased  . PGM  Alive  . PGF  Deceased  . Daughter  (Not Specified)  . Mat Aunt  (Not  Specified)   Family History  Problem Relation Age of Onset  . Diabetes Mother   . Hypertension Mother   . Heart attack Mother   . Heart attack Father   . Hypertension Father   . Diabetes Father   . Stroke Father   . Stroke Maternal Grandfather   . Diabetes Paternal Grandfather   . Asthma Daughter   . Sickle cell anemia Maternal Aunt   . Heart Problems Maternal Aunt    Allergies  Allergen Reactions  . Dilaudid [Hydromorphone Hcl] Hives, Shortness Of Breath and Itching    Syncope  . Black [employer] and Swelling  . Coconut Flavor Swelling      Medications: Outpatient Medications Prior to Visit  Medication Sig  . fluticasone (FLONASE ALLERGY RELIEF) 50 MCG/ACT nasal spray Place 2 sprays into both nostrils daily.  . cyclobenzaprine (FLEXERIL) 5 MG tablet Take 1 tablet (5 mg total) by mouth at bedtime. (Patient not taking: No sig reported)  . Naltrexone-buPROPion HCl ER 8-90 MG TB12 Start 1 tablet every morning for 7 days, then 1 tablet twice daily for 7 days, then 2 tablets every morning and one in the evening (Patient not taking: No sig reported)   No facility-administered medications prior to visit.    Review of Systems  Constitutional: Negative.   HENT: Negative.   Respiratory: Negative.   Cardiovascular: Negative.   Gastrointestinal: Negative.   Genitourinary: Negative.   Musculoskeletal: Negative.   Skin: Positive for rash.  Neurological: Negative.   Psychiatric/Behavioral: Negative.     Last CBC Lab Results  Component Value Date   WBC 7.3 11/10/2019   HGB 12.9 11/10/2019   HCT 40.6 11/10/2019   MCV 81.0 11/10/2019   MCH 25.7 (L) 11/10/2019   RDW 13.6 11/10/2019   PLT 348 11/10/2019   Last metabolic panel Lab Results  Component Value Date   GLUCOSE 89 11/10/2019   NA 141 11/10/2019   K 3.9 11/10/2019   CL 104 11/10/2019   CO2 27 11/10/2019   BUN 19 11/10/2019   CREATININE 0.76 11/10/2019   GFRNONAA >60 11/10/2019   GFRAA >60 11/10/2019    CALCIUM 9.5 11/10/2019   PROT 7.9 11/10/2019   ALBUMIN 4.1 11/10/2019   LABGLOB 2.8 09/14/2019   AGRATIO 1.6 09/14/2019   BILITOT 0.8 11/10/2019   ALKPHOS 98 11/10/2019   AST 15 11/10/2019   ALT 11 11/10/2019   ANIONGAP 10 11/10/2019   Last lipids Lab Results  Component Value Date   CHOL 210 (H) 09/14/2019   HDL 68 09/14/2019   LDLCALC 132 (H) 09/14/2019  TRIG 55 09/14/2019   Last hemoglobin A1c Lab Results  Component Value Date   HGBA1C 5.4 09/14/2019   Last thyroid functions Lab Results  Component Value Date   TSH 0.927 09/14/2019   T4TOTAL 6.9 09/14/2019   Last vitamin D Lab Results  Component Value Date   VD25OH 14.4 (L) 09/14/2019   Last vitamin B12 and Folate No results found for: VITAMINB12, FOLATE    Objective    There were no vitals taken for this visit. BP Readings from Last 3 Encounters:  05/07/20 123/84  03/20/20 123/89  01/06/20 (!) 126/86   Wt Readings from Last 3 Encounters:  09/12/20 208 lb 6.4 oz (94.5 kg)  06/06/20 206 lb (93.4 kg)  05/07/20 203 lb 12.8 oz (92.4 kg)      Physical Exam    Patient is alert and oriented and responsive to questions Engages in conversation with provider. Speaks in full sentences without any pauses without any shortness of breath or distress.     Assessment & Plan     Class 2 obesity without serious comorbidity with body mass index (BMI) of 38.0 to 38.9 in adult, unspecified obesity type  Fungal skin infection - Plan: ketoconazole (NIZORAL) 2 % cream  Meds ordered this encounter  Medications  . ketoconazole (NIZORAL) 2 % cream    Sig: Apply 1 application topically 2 (two) times daily.    Dispense:  84 g    Refill:  0  be seen If itching of skin does not improve.   Red Flags discussed. The patient was given clear instructions to go to ER or return to medical center if any red flags develop, symptoms do not improve, worsen or new problems develop. They verbalized understanding.  Return in about  3 months (around 12/26/2020), or if symptoms worsen or fail to improve, for at any time for any worsening symptoms, Go to Emergency room/ urgent care if worse.     I discussed the assessment and treatment plan with the patient. The patient was provided an opportunity to ask questions and all were answered. The patient agreed with the plan and demonstrated an understanding of the instructions.   The patient was advised to call back or seek an in-person evaluation if the symptoms worsen or if the condition fails to improve as anticipated.  I provided 22  minutes of non-face-to-face time during this encounter.  The entirety of the information documented in the History of Present Illness, Review of Systems and Physical Exam were personally obtained by me. Portions of this information were initially documented by the CMA and reviewed by me for thoroughness and accuracy.    Jairo Ben, FNP [facility] [phone] (phone) [phone] (fax)  [facility]

## 2020-10-12 ENCOUNTER — Other Ambulatory Visit: Payer: Self-pay

## 2020-10-12 ENCOUNTER — Telehealth (INDEPENDENT_AMBULATORY_CARE_PROVIDER_SITE_OTHER): Payer: Federal, State, Local not specified - PPO | Admitting: Family Medicine

## 2020-10-12 ENCOUNTER — Encounter: Payer: Self-pay | Admitting: Family Medicine

## 2020-10-12 DIAGNOSIS — G5603 Carpal tunnel syndrome, bilateral upper limbs: Secondary | ICD-10-CM | POA: Diagnosis not present

## 2020-10-12 DIAGNOSIS — E669 Obesity, unspecified: Secondary | ICD-10-CM

## 2020-10-12 DIAGNOSIS — R635 Abnormal weight gain: Secondary | ICD-10-CM | POA: Diagnosis not present

## 2020-10-12 DIAGNOSIS — Z6839 Body mass index (BMI) 39.0-39.9, adult: Secondary | ICD-10-CM

## 2020-10-12 HISTORY — DX: Carpal tunnel syndrome, bilateral upper limbs: G56.03

## 2020-10-12 NOTE — Assessment & Plan Note (Signed)
Continue to meet with nutrition and work on diet and exercise Continue to pursue gastric sleeve procedure Discussed importance of healthy weight management Discussed diet and exercise

## 2020-10-12 NOTE — Assessment & Plan Note (Signed)
We will continue to work with bariatrics on getting to requirements for upcoming gastric sleeve procedure as above

## 2020-10-12 NOTE — Progress Notes (Signed)
MyChart Video Visit    Virtual Visit via Video Note   This visit type was conducted due to national recommendations for restrictions regarding the COVID-19 Pandemic (e.g. social distancing) in an effort to limit this patient's exposure and mitigate transmission in our community. This patient is at least at moderate risk for complications without adequate follow up. This format is felt to be most appropriate for this patient at this time. Physical exam was limited by quality of the video and audio technology used for the visit.    Patient location: home Provider location: [facility] Persons involved in the visit: patient, provider  I discussed the limitations of evaluation and management by telemedicine and the availability of in person appointments. The patient expressed understanding and agreed to proceed.  Patient: April Davenport   DOB: [birth year]   29 y.o. Female  MRN: [id number] Visit Date: 10/12/2020  Today's healthcare provider: Shirlee Latch, MD   Chief Complaint  Patient presents with  . Follow-up   Subjective    HPI HPI    Obesity   Last edited by Margo Common, CMA on 10/12/2020 10:32 AM. (History)      Follow-up for Obesity Patient presents today for discuss of bariatric surgery with [facility] bariat[facility] clinic. She reports seeing a nutriontist. She reports consuming a healthy diet and exercising (wlking at work). She is getting the gastric sleeve surgery.  Has gained 2 lbs in the last month, but she continues to work with nutrition on dietary planning and prepping for surgery.   She is still working on requirements before scheduling.  Wants to set up appointment in person for hand and arm pain when typing.    Medications: Outpatient Medications Prior to Visit  Medication Sig  . fluticasone (FLONASE ALLERGY RELIEF) 50 MCG/ACT nasal spray Place 2 sprays into both nostrils daily.  Marland Kitchen ketoconazole (NIZORAL) 2 % cream Apply 1  application topically 2 (two) times daily.  . [DISCONTINUED] cyclobenzaprine (FLEXERIL) 5 MG tablet Take 1 tablet (5 mg total) by mouth at bedtime. (Patient not taking: No sig reported)  . [DISCONTINUED] Naltrexone-buPROPion HCl ER 8-90 MG TB12 Start 1 tablet every morning for 7 days, then 1 tablet twice daily for 7 days, then 2 tablets every morning and one in the evening (Patient not taking: No sig reported)   No facility-administered medications prior to visit.    Review of Systems - no CP, SOB, leg pain    Objective    There were no vitals taken for this visit.   Physical Exam Constitutional:      General: She is not in acute distress.    Appearance: Normal appearance.  HENT:     Head: Normocephalic.  Pulmonary:     Effort: Pulmonary effort is normal. No respiratory distress.  Neurological:     Mental Status: She is alert and oriented to person, place, and time. Mental status is at baseline.      Assessment & Plan     Problem List Items Addressed This Visit      Nervous and Auditory   Carpal tunnel syndrome, bilateral    New problem Her description of thumb and forefinger pain after doing activities with her wrist bent is consistent with bilateral carpal tunnel syndrome She has never had this looked at previously Advised NSAIDs as needed, icing Consider cock-up wrist splints at night Referral to Ortho      Relevant Orders   Ambulatory referral to Orthopedic Surgery  Other   Class 2 obesity without serious comorbidity with body mass index (BMI) of 39.0 to 39.9 in adult - Primary    Continue to meet with nutrition and work on diet and exercise Continue to pursue gastric sleeve procedure Discussed importance of healthy weight management Discussed diet and exercise       Weight gain    We will continue to work with bariatrics on getting to requirements for upcoming gastric sleeve procedure as above          Return in about 4 weeks (around 11/09/2020) for  chronic disease f/u, virtual ok.     I discussed the assessment and treatment plan with the patient. The patient was provided an opportunity to ask questions and all were answered. The patient agreed with the plan and demonstrated an understanding of the instructions.   The patient was advised to call back or seek an in-person evaluation if the symptoms worsen or if the condition fails to improve as anticipated.  I, Shirlee Latch, MD, have reviewed all documentation for this visit. The documentation on 10/12/20 for the exam, diagnosis, procedures, and orders are all accurate and complete.   Kisean Rollo, Marzella Schlein, MD, MPH [facility]

## 2020-10-12 NOTE — Assessment & Plan Note (Signed)
New problem Her description of thumb and forefinger pain after doing activities with her wrist bent is consistent with bilateral carpal tunnel syndrome She has never had this looked at previously Advised NSAIDs as needed, icing Consider cock-up wrist splints at night Referral to Ortho

## 2020-10-17 ENCOUNTER — Ambulatory Visit: Payer: Federal, State, Local not specified - PPO | Admitting: Skilled Nursing Facility1

## 2020-10-17 ENCOUNTER — Encounter: Payer: Federal, State, Local not specified - PPO | Attending: Surgery | Admitting: Skilled Nursing Facility1

## 2020-10-17 DIAGNOSIS — E669 Obesity, unspecified: Secondary | ICD-10-CM | POA: Diagnosis not present

## 2020-10-17 NOTE — Progress Notes (Signed)
Supervised Weight Loss Visit Bariatric Nutrition Education  Planned Surgery: sleeve gastrectomy   1 out of 3 SWL Appointments   Appt conducted via virtual pt verbally agrees to the limitations of this visit type.   NUTRITION ASSESSMENT  Planned surgery: sleeve gastrectomy    NUTRITION ASSESSMENT   Anthropometrics  Start weight at [facility]: 208.4 lbs (date: 09/12/2020)  Weight: 211 pounds pt stated Height: 61 in BMI: 39.87 kg/m2     Clinical  Medical hx: N/A Medications: N/A  Labs:  Notable signs/symptoms: sciatic nerve  Any previous deficiencies? Iron   Lifestyle & Dietary Hx  Pt states for her goal of eating throughout the day she feels she did not do as well as she was hoping she would.  Pt states she has been eating her lunch and dinner but not breakfast. Pt states she does a lot of breakfast type foods for dinner.   Estimated daily fluid intake: unknown oz Supplements:  Current average weekly physical activity: ADL's  24-Hr Dietary Recall First Meal:  Snack: cheerios granola bar Second Meal 1:30-2: sandwich + chips Snack:  Third Meal 8:30pm: hamburger patties + rice + gravy + green beans or fast food Snack:  Beverages: water, gatorade  Estimated Energy Needs Calories: 1500   NUTRITION DIAGNOSIS  Overweight/obesity ([place]-3.3) related to past poor dietary habits and physical inactivity as evidenced by patient w/ planned sleeve gastrectomy surgery following dietary guidelines for continued weight loss.   NUTRITION INTERVENTION  Nutrition counseling (C-1) and education (E-2) to facilitate bariatric surgery goals.  Pre-Op Goals Progress & New Goals -Continue: Consume 3 meals per day or try to eat every 3-5 hours: do not worry so much about portion sizes or what it is just focus one this one step and eat more often throughout the day: follow detailed myplate with meal ideas on it -NEW: cut back on high fat meal for dinner -NEW: try some simple breakfast like:  [employer] protein bar or boiled egg with fruit or high fiber cereal or yogurt -NEW: be more active  Handouts Previously Provided Include   Detailed myplate  Learning Style & Readiness for Change Teaching method utilized: Visual & Auditory  Demonstrated degree of understanding via: Teach Back  Readiness Level: contemplative  Barriers to learning/adherence to lifestyle change:   RD's Notes for next Visit  . Assess pts adherence to chosen goals   MONITORING & EVALUATION Dietary intake, weekly physical activity, body weight, and pre-op goals in 1 month.   Next Steps  Patient is to return to [facility] for next SWL

## 2020-11-19 ENCOUNTER — Telehealth: Payer: Self-pay

## 2020-11-19 ENCOUNTER — Encounter: Payer: Federal, State, Local not specified - PPO | Attending: Surgery | Admitting: Skilled Nursing Facility1

## 2020-11-19 ENCOUNTER — Ambulatory Visit: Payer: Self-pay | Admitting: *Deleted

## 2020-11-19 ENCOUNTER — Other Ambulatory Visit: Payer: Self-pay

## 2020-11-19 DIAGNOSIS — E669 Obesity, unspecified: Secondary | ICD-10-CM | POA: Diagnosis not present

## 2020-11-19 NOTE — Telephone Encounter (Signed)
C/o yeast infection symptoms. No available appt until 11/26/20. ' Called patient to review symptoms. C/o severe itching, brown discharge and odor since yesterday. Denies fever. Patient reports she has been treated with symptoms before and would like medication to treat symptoms. No available appt until 11/26/20. Care advise given. Patient verbalized understanding of care advise and to call back or go to [facility] or ED if symptoms worsen.

## 2020-11-19 NOTE — Progress Notes (Signed)
Supervised Weight Loss Visit Bariatric Nutrition Education  Planned Surgery: sleeve gastrectomy   2 out of 3 SWL Appointments    NUTRITION ASSESSMENT  Planned surgery: sleeve gastrectomy    NUTRITION ASSESSMENT   Anthropometrics  Start weight at [facility]: 208.4 lbs (date: 09/12/2020)  Weight: 214 Height: 61 in BMI: 40.47 kg/m2     Clinical  Medical hx: N/A Medications: N/A  Labs:  Notable signs/symptoms: sciatic nerve  Any previous deficiencies? Iron   Lifestyle & Dietary Hx  Pt states she knows she gained the weight from increased snacking due to working more and trying to stay up.  Pt states she sees where her sweet tooth has decreased since making changes but struggles with wanting it throughout the week.  Estimated daily fluid intake: unknown oz Supplements:  Current average weekly physical activity: ADL's  24-Hr Dietary Recall First Meal: pancakes + syrup (fast food) or granola bar + yogurt  Snack:  Second Meal 1:30-2: sandwich + granola bar Snack:  Third Meal 8:30pm: hamburger patties + rice + gravy + green beans or fast food Snack:  Beverages: water, gatorade  Estimated Energy Needs Calories: 1500   NUTRITION DIAGNOSIS  Overweight/obesity ([place]-3.3) related to past poor dietary habits and physical inactivity as evidenced by patient w/ planned sleeve gastrectomy surgery following dietary guidelines for continued weight loss.   NUTRITION INTERVENTION  Nutrition counseling (C-1) and education (E-2) to facilitate bariatric surgery goals.  Pre-Op Goals Progress & New Goals -Continue: Consume 3 meals per day or try to eat every 3-5 hours: do not worry so much about portion sizes or what it is just focus one this one step and eat more often throughout the day: follow detailed myplate with meal ideas on it -continue: cut back on high fat meal for dinner -continue: try some simple breakfast like: [employer] protein bar or boiled egg with fruit or high fiber  cereal or yogurt -continue: be more active -NEW: find a hobby to help with preoccupation with wanting sweets   Handouts Previously Provided Include  Detailed myplate  Learning Style & Readiness for Change Teaching method utilized: Visual & Auditory  Demonstrated degree of understanding via: Teach Back  Readiness Level: contemplative  Barriers to learning/adherence to lifestyle change:   RD's Notes for next Visit  Assess pts adherence to chosen goals   MONITORING & EVALUATION Dietary intake, weekly physical activity, body weight, and pre-op goals in 1 month.   Next Steps  Patient is to return to [facility] for next SWL

## 2020-11-19 NOTE — Telephone Encounter (Signed)
Copied from CRM [id number]. Topic: General - Other >> Nov 19, 2020 12:45 PM [name] wrote: Reason for CRM: patient, called in about getting labs, she states they have been coming from, [place] [facility]. Didn't specify what labs. Please call back

## 2020-11-19 NOTE — Telephone Encounter (Signed)
Reason for Disposition  MODERATE-SEVERE itching (i.e., interferes with school, work, or sleep)  Answer Assessment - Initial Assessment Questions 1. SYMPTOM: "What's the main symptom you're concerned about?" (e.g., pain, itching, dryness)     Itching , discharge 2. LOCATION: "Where is the  itching located?" (e.g., inside/outside, left/right)     Inside and outside  3. ONSET: "When did the  itching  start?"     Yesterday  4. PAIN: "Is there any pain?" If Yes, ask: "How bad is it?" (Scale: 1-10; mild, moderate, severe)     No pain 5. ITCHING: "Is there any itching?" If Yes, ask: "How bad is it?" (Scale: 1-10; mild, moderate, severe)     severe 6. CAUSE: "What do you think is causing the discharge?" "Have you had the same problem before? What happened then?"     Brown,  positive odor ,thick discharge  7. OTHER SYMPTOMS: "Do you have any other symptoms?" (e.g., fever, itching, vaginal bleeding, pain with urination, injury to genital area, vaginal foreign body)     Discomfort urination 8. PREGNANCY: "Is there any chance you are pregnant?" "When was your last menstrual period?"     no  Protocols used: Vaginal Symptoms-A-AH

## 2020-11-20 NOTE — Telephone Encounter (Signed)
Left message to call back  

## 2020-11-21 ENCOUNTER — Ambulatory Visit (INDEPENDENT_AMBULATORY_CARE_PROVIDER_SITE_OTHER): Payer: Federal, State, Local not specified - PPO | Admitting: Psychology

## 2020-11-21 DIAGNOSIS — F509 Eating disorder, unspecified: Secondary | ICD-10-CM

## 2020-11-21 NOTE — Telephone Encounter (Signed)
Called and spoke with April Davenport. [name] states that she has been experiencing Vaginal discomfort and sensitivity. She states it feels like "needles" whenever she wipes after the restroom. Pt c/o odor, discharge, itching x3-4days. She is concerned as she states this has happened every month for this current year and that she gets a similar UTI 3-4 days before her period. She is agreeable to going to Urgent care for immediate treatment as Marcelino Duster is not in the office today and lack of availability. She scheduled for a virtual appointment on 11/27/19 at 4:30pm as that is her only day off and soonest availability. Pt wants to discuss reoccurring Uti symptoms and menstrual cycle. She asks if it should be moved to in person due to her symptoms for a possible vaginal check or labs. Please advise.

## 2020-11-22 NOTE — Telephone Encounter (Signed)
Spoke with [employer]. Left the 4:30 appointment for a virtual visit.

## 2020-11-22 NOTE — Telephone Encounter (Signed)
LMTCB

## 2020-11-22 NOTE — Telephone Encounter (Signed)
Called and spoke with April Davenport. April Davenport states that she does not have a gynecologist and asks to be referred to one in [place]. She states that she does not mind seeing April Davenport or going back to [facility]. I informed her that it is up to her and she needs to let us know. I informed her that she needs to be seen in person over the frequent UTI's and advised her to go to an urgent care to be evaluated in person for a vaginal exam. I let her know that she could keep the appointment and speak with April Davenport as a follow up after her visit to Urgent Care. April Davenport verbalized understanding and states that she will go to urgent care for her symptoms and declined a follow up and said that it was unnecessary. Cancelled the 4:30 virtual visit with April Davenport.

## 2020-11-23 NOTE — Telephone Encounter (Signed)
I spoke with patient & she stated that she spoke with someone yesterday was advised UC. She said that she has not gone due to starting menstrual cycle. Itching started 3 days ago & was out of the norm. She cannot tell now if she having discharge & only odor is that of menstrual blood. I advised that could be yeast or UTI just did not know & it was best to be seen. She was unsure if she would now or after cycle. I told her for UTI she needed to be seen now. Pt verbalized understanding.

## 2020-11-23 NOTE — Telephone Encounter (Signed)
LM for pt to return call. Need to know if pt is asking if we received results from [facility], or if pt is asking [place] to send results. FYI pt has an appt with Pratt Macoupin next week and will be transferring care.

## 2020-11-26 ENCOUNTER — Telehealth: Payer: Federal, State, Local not specified - PPO | Admitting: Adult Health

## 2020-12-12 ENCOUNTER — Ambulatory Visit (INDEPENDENT_AMBULATORY_CARE_PROVIDER_SITE_OTHER): Payer: Federal, State, Local not specified - PPO | Admitting: Psychology

## 2020-12-12 DIAGNOSIS — F509 Eating disorder, unspecified: Secondary | ICD-10-CM

## 2020-12-13 DIAGNOSIS — B373 Candidiasis of vulva and vagina: Secondary | ICD-10-CM | POA: Diagnosis not present

## 2020-12-13 DIAGNOSIS — Z3202 Encounter for pregnancy test, result negative: Secondary | ICD-10-CM | POA: Diagnosis not present

## 2020-12-13 DIAGNOSIS — N898 Other specified noninflammatory disorders of vagina: Secondary | ICD-10-CM | POA: Diagnosis not present

## 2020-12-13 DIAGNOSIS — N390 Urinary tract infection, site not specified: Secondary | ICD-10-CM | POA: Diagnosis not present

## 2020-12-17 ENCOUNTER — Encounter: Payer: Federal, State, Local not specified - PPO | Attending: Surgery | Admitting: Skilled Nursing Facility1

## 2020-12-17 DIAGNOSIS — E669 Obesity, unspecified: Secondary | ICD-10-CM | POA: Diagnosis not present

## 2020-12-17 NOTE — Progress Notes (Addendum)
Supervised Weight Loss Visit Bariatric Nutrition Education  Planned Surgery: sleeve gastrectomy  Pt completed visits.   Pt has cleared nutrition requirements.   3 out of 3 [facility] Appointments   Appt conducted virtually; pt identified by name and DOB; pt verbally agreed to the limitations of this visit type   NUTRITION ASSESSMENT  Planned surgery: sleeve gastrectomy    NUTRITION ASSESSMENT   Anthropometrics  Start weight at [facility]: 208.4 lbs (date: 09/12/2020)  Weight: 214 pounds (pt reported) Height: 61 in BMI: 40.43 kg/m2     Clinical  Medical hx: N/A Medications: N/A  Labs:  Notable signs/symptoms: sciatic nerve  Any previous deficiencies? Iron   Lifestyle & Dietary Hx  Pt states she has been consistent with eating throughout the day and trying to avoid soda.   Pt states in the last few months she has learned: what her food habits are.  Pt states she feels she may struggle with: the new diet and following it after surgery  Estimated daily fluid intake: unknown oz Supplements:  Current average weekly physical activity: ADL's  24-Hr Dietary Recall First Meal: cereal Snack:  Second Meal 1:30-2: chicken wrap Snack:  Third Meal 8:30pm: hamburger patties + rice + gravy + green beans or fast food Snack:  Beverages: water, gatorade  Estimated Energy Needs Calories: 1500   NUTRITION DIAGNOSIS  Overweight/obesity (Sikes-3.3) related to past poor dietary habits and physical inactivity as evidenced by patient w/ planned sleeve gastrectomy surgery following dietary guidelines for continued weight loss.   NUTRITION INTERVENTION  Nutrition counseling (C-1) and education (E-2) to facilitate bariatric surgery goals.  Pre-Op Goals Progress & New Goals -Continue: Consume 3 meals per day or try to eat every 3-5 hours: do not worry so much about portion sizes or what it is just focus one this one step and eat more often throughout the day: follow detailed myplate with meal  ideas on it -continue: cut back on high fat meal for dinner -continue: try some simple breakfast like: [place] protein bar or boiled egg with fruit or high fiber cereal or yogurt -continue: be more active -continue: find a hobby to help with preoccupation with wanting sweets   Handouts Previously Provided Include  Detailed myplate  Learning Style & Readiness for Change Teaching method utilized: Visual & Auditory  Demonstrated degree of understanding via: Teach Back  Readiness Level: contemplative  Barriers to learning/adherence to lifestyle change: unknown currently   RD's Notes for next Visit  Assess pts adherence to chosen goals   MONITORING & EVALUATION Dietary intake, weekly physical activity, body weight, and pre-op goals  Next Steps  Patient is to return to [facility] for preop class Pt has completed visits. No further supervised visits required

## 2021-01-17 NOTE — Progress Notes (Signed)
Sent message, via epic in basket, requesting orders in epic from surgeon.  

## 2021-01-18 ENCOUNTER — Ambulatory Visit: Payer: Self-pay | Admitting: Surgery

## 2021-01-18 NOTE — H&P (View-Only) (Signed)
Surgical Evaluation  Chief Complaint: morbid obesity  HPI: Returns for follow-up regarding surgical management of morbid obesity.  She has completed the bariatric pathway with no barriers identified.  Denies any changes in her health since our initial meeting 5 months ago. Currently scheduled for surgery 02/04/2021.  She has several insightful questions to discuss today.  CXR/UGI 08/20/20: negative, no hh Dietician: approved/ completed SWL [employer])   Initial visit 08/08/20: Very pleasant 30 year old woman presents to discuss surgical management of morbid obesity.  She has struggled with this most of her life, beginning in middle school.  She has tried countless diet programs, exercise methods, and medications with limited success.  Her most Successful attempt was with phentermine but she had intolerable side effects including palpitations.  Most recently she was started on naltrexone bupropion in December, but was not able to comply with this very well due to cost and was unable to pick up the prescription. She is interested in pursuing surgery and the definitive arm of treatment of her obesity in order to prevent developing the complications obesity she's experienced with of her parents and many other family members, in order to prolong her life and improve quality.  She has attended the bariatric seminar and has done a fair amount of research on her own.   Her primary care provider is Marvell Fuller FNP at [facility] family practice. Medical history includes kidney stones, hypertension, depression, anxiety, anemia, and allergy, she also knows that she has had intermittently elevated blood sugar.  Most recent lab work available as June 2021 including a CMP, iron panel, CBC which were all unremarkable. Surgical history includes C-sections 2 Family history notable for obesity but is predominantly on her father's side of the family, heart disease in both parents, diabetes and hypertension in  both parents. Social history she is a very rare alcohol drinker, no drug use, no tobacco use.  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 206lb/ BMI 39  Allergies  Allergen Reactions   Dilaudid [Hydromorphone Hcl] Hives, Shortness Of Breath and Itching    Syncope   Black Walnut Flavor Hives and Swelling   Coconut Flavor Swelling    Past Medical History:  Diagnosis Date   Allergy    Anemia    Antepartum mild preeclampsia 05/15/2013   Persistent mild range blood pressures.  24 hour urine protein with 479g.  Plan for twice weekly NST, weekly AFI, and delivery at 37 weeks unless concern for worsening disease.  Formatting of this note might be different from the original. Persistent mild range blood pressures.  24 hour urine protein with 479g.  Plan for twice weekly NST, weekly AFI, and delivery at 37 weeks unless concern for wors   Anxiety    Carpal tunnel syndrome, bilateral 10/12/2020   Depression    Hypertension    Lactating mother    Preterm delivery, delivered 05/30/2013   C/s 05/16/13 at 35 weeks for severe preeclampsia, baby girl 5#4 - d/c'd on labetalol, procardia added 05/24/13  Formatting of this note might be different from the original. C/s 05/16/13 at 35 weeks for severe preeclampsia, baby girl 5#4 - d/c'd on labetalol, procardia added 05/24/13   Right nephrolithiasis     Past Surgical History:  Procedure Laterality Date   CESAREAN SECTION  05/16/2013   CESAREAN SECTION WITH BILATERAL TUBAL LIGATION Bilateral 06/30/2019   Procedure: CESAREAN SECTION WITH BILATERAL TUBAL LIGATION;  Surgeon: Tereso Newcomer, MD;  Location: [facility] LD ORS;  Service: Obstetrics;  Laterality: Bilateral;   KNEE SURGERY Right     Family History  Problem Relation Age of Onset   Diabetes Mother    Hypertension Mother    Heart attack Mother    Heart attack Father    Hypertension Father    Diabetes Father    Stroke Father    Stroke Maternal Grandfather     Diabetes Paternal Grandfather    Asthma Daughter    Sickle cell anemia Maternal Aunt    Heart Problems Maternal Aunt     Social History   Socioeconomic History   Marital status: Single    Spouse name: Not on file   Number of children: Not on file   Years of education: Not on file   Highest education level: Not on file  Occupational History   Not on file  Tobacco Use   Smoking status: Never   Smokeless tobacco: Never  Vaping Use   Vaping Use: Never used  Substance and Sexual Activity   Alcohol use: Not Currently    Comment: rare   Drug use: Never   Sexual activity: Yes    Birth control/protection: None, Surgical    Comment: tubal ligation   Other Topics Concern   Not on file  Social History Narrative   Not on file   Social Determinants of Health   Financial Resource Strain: Not on file  Food Insecurity: Not on file  Transportation Needs: Not on file  Physical Activity: Not on file  Stress: Not on file  Social Connections: Not on file    Current Outpatient Medications on File Prior to Visit  Medication Sig Dispense Refill   fluticasone (FLONASE ALLERGY RELIEF) 50 MCG/ACT nasal spray Place 2 sprays into both nostrils daily. 15.8 mL 1   No current facility-administered medications on file prior to visit.    Review of Systems: a complete, 10pt review of systems was completed with pertinent positives and negatives as documented in the HPI  Physical Exam: Vitals:   01/25/21 1628  BP: 122/72  Pulse: 104  Temp: 36.6 C (97.9 F)  SpO2: 100%  Weight: 98.2 kg (216 lb 9.6 oz)  Height: 154.9 cm (5\' 1" )    Body mass index is 40.93 kg/m.  Alert and well appearing Unlabored respirations   CBC Latest Ref Rng & Units 11/10/2019 09/14/2019 07/01/2019  WBC 4.0 - 10.5 K/uL 7.3 6.9 13.4(H)  Hemoglobin 12.0 - 15.0 g/dL 07/03/2019 71.0 10.0(L)  Hematocrit 36.0 - 46.0 % 40.6 40.1 30.6(L)  Platelets 150 - 400 K/uL 348 387 271    CMP Latest Ref Rng & Units 11/10/2019 09/14/2019  07/01/2019  Glucose 70 - 99 mg/dL 89 85 07/03/2019)  BUN 6 - 20 mg/dL 19 13 8   Creatinine 0.44 - 1.00 mg/dL 948(N 4.62  Sodium 135 - 145 mmol/L 141 142 134(L)  Potassium 3.5 - 5.1 mmol/L 3.9 3.9 4.4  Chloride 98 - 111 mmol/L 104 103 104  CO2 22 - 32 mmol/L 27 23 22   Calcium 8.9 - 10.3 mg/dL 9.5 9.9 9.1  Total Protein 6.5 - 8.1 g/dL 7.9 7.2 5.1(L)  Total Bilirubin 0.3 - 1.2 mg/dL 0.8 0.7 0.3  Alkaline Phos 38 - 126 U/L 98 122(H) 109  AST 15 - 41 U/L 15 19 20   ALT 0 - 44 U/L 11 11 13     No results found for: INR, PROTIME  Imaging: No results found.   A/P:  MORBID OBESITY (E66.01) Story:  She remains a good candidate for sleeve gastrectomy. We have previously discussed the surgery including technical aspects, the risks of bleeding, infection, pain, scarring, injury to intra-abdominal structures, staple line leak or abscess, chronic abdominal pain or nausea, new onset or worsened GERD, DVT/PE, pneumonia, heart attack, stroke, death, failure to reach weight loss goals and weight regain, hernia. Discussed the typical peri-, and postoperative course. Discussed the importance of lifelong behavioral changes to combat the chronic and relapsing disease which is obesity. Questions were welcomed and answered.  We will plan to proceed as scheduled on 02/04/2021. HYPERTENSION (I10) Story: She has had numerous high readings, but has not been consistent enough to be started on medication at this point. ANXIETY (F41.9)   Patient Active Problem List   Diagnosis Date Noted   Carpal tunnel syndrome, bilateral 10/12/2020   Weight gain 06/06/2020   Anxiety 03/20/2020   History of bilateral tubal ligation 11/10/2019   Social anxiety disorder 10/20/2019   Generalized anxiety disorder 10/06/2019   Postpartum depression 10/06/2019   Vitamin D deficiency 09/13/2019   Stress headaches 09/13/2019   Prediabetes 09/13/2019   Acute non-recurrent frontal sinusitis 09/13/2019   Seasonal allergies 09/13/2019    Anemia 09/13/2019   Class 2 obesity without serious comorbidity with body mass index (BMI) of 39.0 to 39.9 in adult 12/09/2018   Hypertension 06/27/2013   Postpartum care following cesarean delivery 06/27/2013   Family history of diabetes mellitus type II 01/20/2013   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

## 2021-01-18 NOTE — H&P (Signed)
Surgical Evaluation  Chief Complaint: morbid obesity  HPI: Returns for follow-up regarding surgical management of morbid obesity.  She has completed the bariatric pathway with no barriers identified.  Denies any changes in her health since our initial meeting 5 months ago. Currently scheduled for surgery 02/04/2021.  She has several insightful questions to discuss today.  CXR/UGI 08/20/20: negative, no hh Dietician: approved/ completed SWL [employer])   Initial visit 08/08/20: Very pleasant 30 year old woman presents to discuss surgical management of morbid obesity.  She has struggled with this most of her life, beginning in middle school.  She has tried countless diet programs, exercise methods, and medications with limited success.  Her most Successful attempt was with phentermine but she had intolerable side effects including palpitations.  Most recently she was started on naltrexone bupropion in December, but was not able to comply with this very well due to cost and was unable to pick up the prescription. She is interested in pursuing surgery and the definitive arm of treatment of her obesity in order to prevent developing the complications obesity she's experienced with of her parents and many other family members, in order to prolong her life and improve quality.  She has attended the bariatric seminar and has done a fair amount of research on her own.   Her primary care provider is Marvell Fuller FNP at [facility] family practice. Medical history includes kidney stones, hypertension, depression, anxiety, anemia, and allergy, she also knows that she has had intermittently elevated blood sugar.  Most recent lab work available as June 2021 including a CMP, iron panel, CBC which were all unremarkable. Surgical history includes C-sections 2 Family history notable for obesity but is predominantly on her father's side of the family, heart disease in both parents, diabetes and hypertension in  both parents. Social history she is a very rare alcohol drinker, no drug use, no tobacco use.  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 206lb/ BMI 39  Allergies  Allergen Reactions   Dilaudid [Hydromorphone Hcl] Hives, Shortness Of Breath and Itching    Syncope   Black Walnut Flavor Hives and Swelling   Coconut Flavor Swelling    Past Medical History:  Diagnosis Date   Allergy    Anemia    Antepartum mild preeclampsia 05/15/2013   Persistent mild range blood pressures.  24 hour urine protein with 479g.  Plan for twice weekly NST, weekly AFI, and delivery at 37 weeks unless concern for worsening disease.  Formatting of this note might be different from the original. Persistent mild range blood pressures.  24 hour urine protein with 479g.  Plan for twice weekly NST, weekly AFI, and delivery at 37 weeks unless concern for wors   Anxiety    Carpal tunnel syndrome, bilateral 10/12/2020   Depression    Hypertension    Lactating mother    Preterm delivery, delivered 05/30/2013   C/s 05/16/13 at 35 weeks for severe preeclampsia, baby girl 5#4 - d/c'd on labetalol, procardia added 05/24/13  Formatting of this note might be different from the original. C/s 05/16/13 at 35 weeks for severe preeclampsia, baby girl 5#4 - d/c'd on labetalol, procardia added 05/24/13   Right nephrolithiasis     Past Surgical History:  Procedure Laterality Date   CESAREAN SECTION  05/16/2013   CESAREAN SECTION WITH BILATERAL TUBAL LIGATION Bilateral 06/30/2019   Procedure: CESAREAN SECTION WITH BILATERAL TUBAL LIGATION;  Surgeon: Tereso Newcomer, MD;  Location: [facility] LD ORS;  Service: Obstetrics;  Laterality: Bilateral;   KNEE SURGERY Right     Family History  Problem Relation Age of Onset   Diabetes Mother    Hypertension Mother    Heart attack Mother    Heart attack Father    Hypertension Father    Diabetes Father    Stroke Father    Stroke Maternal Grandfather     Diabetes Paternal Grandfather    Asthma Daughter    Sickle cell anemia Maternal Aunt    Heart Problems Maternal Aunt     Social History   Socioeconomic History   Marital status: Single    Spouse name: Not on file   Number of children: Not on file   Years of education: Not on file   Highest education level: Not on file  Occupational History   Not on file  Tobacco Use   Smoking status: Never   Smokeless tobacco: Never  Vaping Use   Vaping Use: Never used  Substance and Sexual Activity   Alcohol use: Not Currently    Comment: rare   Drug use: Never   Sexual activity: Yes    Birth control/protection: None, Surgical    Comment: tubal ligation   Other Topics Concern   Not on file  Social History Narrative   Not on file   Social Determinants of Health   Financial Resource Strain: Not on file  Food Insecurity: Not on file  Transportation Needs: Not on file  Physical Activity: Not on file  Stress: Not on file  Social Connections: Not on file    Current Outpatient Medications on File Prior to Visit  Medication Sig Dispense Refill   fluticasone (FLONASE ALLERGY RELIEF) 50 MCG/ACT nasal spray Place 2 sprays into both nostrils daily. 15.8 mL 1   No current facility-administered medications on file prior to visit.    Review of Systems: a complete, 10pt review of systems was completed with pertinent positives and negatives as documented in the HPI  Physical Exam: Vitals:   01/25/21 1628  BP: 122/72  Pulse: 104  Temp: 36.6 C (97.9 F)  SpO2: 100%  Weight: 98.2 kg (216 lb 9.6 oz)  Height: 154.9 cm (5\' 1" )    Body mass index is 40.93 kg/m.  Alert and well appearing Unlabored respirations   CBC Latest Ref Rng & Units 11/10/2019 09/14/2019 07/01/2019  WBC 4.0 - 10.5 K/uL 7.3 6.9 13.4(H)  Hemoglobin 12.0 - 15.0 g/dL 07/03/2019 71.0 10.0(L)  Hematocrit 36.0 - 46.0 % 40.6 40.1 30.6(L)  Platelets 150 - 400 K/uL 348 387 271    CMP Latest Ref Rng & Units 11/10/2019 09/14/2019  07/01/2019  Glucose 70 - 99 mg/dL 89 85 07/03/2019)  BUN 6 - 20 mg/dL 19 13 8   Creatinine 0.44 - 1.00 mg/dL 948(N 4.62  Sodium 135 - 145 mmol/L 141 142 134(L)  Potassium 3.5 - 5.1 mmol/L 3.9 3.9 4.4  Chloride 98 - 111 mmol/L 104 103 104  CO2 22 - 32 mmol/L 27 23 22   Calcium 8.9 - 10.3 mg/dL 9.5 9.9 9.1  Total Protein 6.5 - 8.1 g/dL 7.9 7.2 5.1(L)  Total Bilirubin 0.3 - 1.2 mg/dL 0.8 0.7 0.3  Alkaline Phos 38 - 126 U/L 98 122(H) 109  AST 15 - 41 U/L 15 19 20   ALT 0 - 44 U/L 11 11 13     No results found for: INR, PROTIME  Imaging: No results found.   A/P:  MORBID OBESITY (E66.01) Story:  She remains a good candidate for sleeve gastrectomy. We have previously discussed the surgery including technical aspects, the risks of bleeding, infection, pain, scarring, injury to intra-abdominal structures, staple line leak or abscess, chronic abdominal pain or nausea, new onset or worsened GERD, DVT/PE, pneumonia, heart attack, stroke, death, failure to reach weight loss goals and weight regain, hernia. Discussed the typical peri-, and postoperative course. Discussed the importance of lifelong behavioral changes to combat the chronic and relapsing disease which is obesity. Questions were welcomed and answered.  We will plan to proceed as scheduled on 02/04/2021. HYPERTENSION (I10) Story: She has had numerous high readings, but has not been consistent enough to be started on medication at this point. ANXIETY (F41.9)   Patient Active Problem List   Diagnosis Date Noted   Carpal tunnel syndrome, bilateral 10/12/2020   Weight gain 06/06/2020   Anxiety 03/20/2020   History of bilateral tubal ligation 11/10/2019   Social anxiety disorder 10/20/2019   Generalized anxiety disorder 10/06/2019   Postpartum depression 10/06/2019   Vitamin D deficiency 09/13/2019   Stress headaches 09/13/2019   Prediabetes 09/13/2019   Acute non-recurrent frontal sinusitis 09/13/2019   Seasonal allergies 09/13/2019    Anemia 09/13/2019   Class 2 obesity without serious comorbidity with body mass index (BMI) of 39.0 to 39.9 in adult 12/09/2018   Hypertension 06/27/2013   Postpartum care following cesarean delivery 06/27/2013   Family history of diabetes mellitus type II 01/20/2013   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

## 2021-01-21 ENCOUNTER — Encounter: Payer: Federal, State, Local not specified - PPO | Attending: Surgery | Admitting: Skilled Nursing Facility1

## 2021-01-21 ENCOUNTER — Other Ambulatory Visit: Payer: Self-pay

## 2021-01-21 DIAGNOSIS — E669 Obesity, unspecified: Secondary | ICD-10-CM

## 2021-01-22 NOTE — Progress Notes (Signed)
Pre-Operative Nutrition Class:    Patient was seen on 01/21/2021 for Pre-Operative Bariatric Surgery Education at the [facility].    Surgery date:  Surgery type: sleeve Start weight at [facility]: 208.4 Weight today: 217.8 pounds  Samples given per MNT protocol. Patient educated on appropriate usage: Bariatric Advantage Multivitamin Lot # [id number] Exp: 08/23   Procare Calcium  Lot # [id number] Exp: 03/23   Bariatric Advantage protein powder Lot # [id number] Exp: 10/23  The following the learning objectives were met by the patient during this course: Identify Pre-Op Dietary Goals and will begin 2 weeks pre-operatively Identify appropriate sources of fluids and proteins  State protein recommendations and appropriate sources pre and post-operatively Identify Post-Operative Dietary Goals and will follow for 2 weeks post-operatively Identify appropriate multivitamin and calcium sources Describe the need for physical activity post-operatively and will follow MD recommendations State when to call healthcare provider regarding medication questions or post-operative complications When having a diagnosis of diabetes understanding hypoglycemia symptoms and the inclusion of 1 complex carbohydrate per meal  Handouts given during class include: Pre-Op Bariatric Surgery Diet Handout Protein Shake Handout Post-Op Bariatric Surgery Nutrition Handout BELT Program Information Flyer Support Group Information Flyer [facility] Bariatric Supplements Price List  Follow-Up Plan: Patient will follow-up at [facility] 2 weeks post operatively for diet advancement per MD.

## 2021-01-28 ENCOUNTER — Other Ambulatory Visit (HOSPITAL_COMMUNITY): Payer: Self-pay

## 2021-01-30 NOTE — Patient Instructions (Signed)
DUE TO COVID-19 ONLY ONE VISITOR IS ALLOWED TO COME WITH YOU AND STAY IN THE WAITING ROOM ONLY DURING PRE OP AND PROCEDURE.   **NO VISITORS ARE ALLOWED IN THE SHORT STAY AREA OR RECOVERY ROOM!!**  IF YOU WILL BE ADMITTED INTO THE HOSPITAL YOU ARE ALLOWED ONLY TWO SUPPORT PEOPLE DURING VISITATION HOURS ONLY (10AM -8PM)   The support person(s) may change daily. The support person(s) must pass our screening, gel in and out, and wear a mask at all times, including in the patient's room. Patients must also wear a mask when staff or their support person are in the room.  No visitors under the age of 32. Any visitor under the age of 32 must be accompanied by an adult.    COVID SWAB TESTING MUST BE COMPLETED ON:  01/31/21 **MUST PRESENT COMPLETED FORM AT TESTING SITE**    [address] [place] (backside of the building) You are not required to quarantine, however you are required to wear a well-fitted mask when you are out and around people not in your household.  Hand Hygiene often Do NOT share personal items Notify your provider if you are in close contact with someone who has COVID or you develop fever 100.4 or greater, new onset of sneezing, cough, sore throat, shortness of breath or body aches.  [facility] Entrance [address], [address], must go inside of the hospital, NOT A DRIVE THRU!  (Must self quarantine after testing. Follow instructions on handout.)       Your procedure is scheduled on: 02/04/21   [facility]    Report to admitting at : 7:45 AM   Call this number if you have problems the morning of surgery [phone]   May have liquids until: 7:00 AM    day of surgery  CLEAR LIQUID DIET  Foods Allowed                                                                     Foods Excluded  Water, Black Coffee and tea, regular and decaf                             liquids that you cannot   Plain Jell-O in any flavor  (No red)                                           see through such as: Fruit ices (not with fruit pulp)                                     milk, soups, orange juice              Iced Popsicles (No red)                                    All solid food  Apple juices Sports drinks like Gatorade (No red) Lightly seasoned clear broth or consume(fat free) Sugar,   Sample Menu Breakfast                                Lunch                                     Supper Cranberry juice                    Beef broth                            Chicken broth Jell-O                                     Grape juice                           Apple juice Coffee or tea                        Jell-O                                      Popsicle                                                Coffee or tea                        Coffee or tea   MORNING OF SURGERY DRINK:   DRINK 1 G2 drink BEFORE YOU LEAVE HOME ( AT 7:00 AM), DRINK ALL OF THE  G2 DRINK AT ONE TIME.   NO SOLID FOOD AFTER 600 PM THE NIGHT BEFORE YOUR SURGERY. YOU MAY DRINK CLEAR FLUIDS. THE G2 DRINK YOU DRINK BEFORE YOU LEAVE HOME WILL BE THE LAST FLUIDS YOU DRINK BEFORE SURGERY.  PAIN IS EXPECTED AFTER SURGERY AND WILL NOT BE COMPLETELY ELIMINATED. AMBULATION AND TYLENOL WILL HELP REDUCE INCISIONAL AND GAS PAIN. MOVEMENT IS KEY!  YOU ARE EXPECTED TO BE OUT OF BED WITHIN 4 HOURS OF ADMISSION TO YOUR PATIENT ROOM.  SITTING IN THE RECLINER THROUGHOUT THE DAY IS IMPORTANT FOR DRINKING FLUIDS AND MOVING GAS THROUGHOUT THE GI TRACT.  COMPRESSION STOCKINGS SHOULD BE WORN [facility] STAY UNLESS YOU ARE WALKING.   INCENTIVE SPIROMETER SHOULD BE USED EVERY HOUR WHILE AWAKE TO DECREASE POST-OPERATIVE COMPLICATIONS SUCH AS PNEUMONIA.  WHEN DISCHARGED HOME, IT IS IMPORTANT TO CONTINUE TO WALK EVERY HOUR AND USE THE INCENTIVE SPIROMETER EVERY HOUR.        The day of surgery:   Drink ONE (1) Pre-Surgery Clear  G2 by  7:00 am the morning of surgery. Drink in one sitting. Do not sip.  This drink was given to you during your hospital  pre-op appointment visit. Nothing else to drink after completing the  Pre-Surgery Clear Ensure or G2.  If you have questions, please contact your surgeon's office.     Oral Hygiene is also important to reduce your risk of infection.                                    Remember - BRUSH YOUR TEETH THE MORNING OF SURGERY WITH YOUR REGULAR TOOTHPASTE   Do NOT smoke after Midnight   Take these medicines the morning of surgery with A SIP OF WATER: N/A. Use flonase as usual.                              You may not have any metal on your body including hair pins, jewelry, and body piercing             Do not wear make-up, lotions, powders, perfumes/cologne, or deodorant  Do not wear nail polish including gel and S&S, artificial/acrylic nails, or any other type of covering on natural nails including finger and toenails. If you have artificial nails, gel coating, etc. that needs to be removed by a nail salon please have this removed prior to surgery or surgery may need to be canceled/ delayed if the surgeon/ anesthesia feels like they are unable to be safely monitored.   Do not shave  48 hours prior to surgery.    Do not bring valuables to the hospital. [facility] IS NOT             RESPONSIBLE   FOR VALUABLES.   Contacts, dentures or bridgework may not be worn into surgery.   Bring small overnight bag day of surgery.    Patients discharged the day of surgery will not be allowed to drive home.   Special Instructions: Bring a copy of your healthcare power of attorney and living will documents         the day of surgery if you haven't scanned them in before.              Please read over the following fact sheets you were given: IF YOU HAVE QUESTIONS ABOUT YOUR PRE OP INSTRUCTIONS PLEASE CALL [phone]   [facility] -  Preparing for Surgery Before surgery, you can play an important role.  Because skin is not sterile, your skin needs to be as free of germs as possible.  You can reduce the number of germs on your skin by washing with CHG (chlorahexidine gluconate) soap before surgery.  CHG is an antiseptic cleaner which kills germs and bonds with the skin to continue killing germs even after washing. Please DO NOT use if you have an allergy to CHG or antibacterial soaps.  If your skin becomes reddened/irritated stop using the CHG and inform your nurse when you arrive at Short Stay. Do not shave (including legs and underarms) for at least 48 hours prior to the first CHG shower.  You may shave your face/neck. Please follow these instructions carefully:  1.  Shower with CHG Soap the night before surgery and the  morning of Surgery.  2.  If you choose to wash your hair, wash your hair first as usual with your  normal  shampoo.  3.  After you shampoo, rinse your hair and body thoroughly to remove the  shampoo.  4.  Use CHG as you would any other liquid soap.  You can apply chg directly  to the skin and wash                       Gently with a scrungie or clean washcloth.  5.  Apply the CHG Soap to your body ONLY FROM THE NECK DOWN.   Do not use on face/ open                           Wound or open sores. Avoid contact with eyes, ears mouth and genitals (private parts).                       Wash face,  Genitals (private parts) with your normal soap.             6.  Wash thoroughly, paying special attention to the area where your surgery  will be performed.  7.  Thoroughly rinse your body with warm water from the neck down.  8.  DO NOT shower/wash with your normal soap after using and rinsing off  the CHG Soap.                9.  Pat yourself dry with a clean towel.            10.  Wear clean pajamas.            11.  Place clean sheets on your bed the night of your first shower and do not  sleep  with pets. Day of Surgery : Do not apply any lotions/deodorants the morning of surgery.  Please wear clean clothes to the hospital/surgery center.  FAILURE TO FOLLOW THESE INSTRUCTIONS MAY RESULT IN THE CANCELLATION OF YOUR SURGERY PATIENT SIGNATURE_________________________________  NURSE SIGNATURE__________________________________  ________________________________________________________________________

## 2021-02-01 ENCOUNTER — Encounter (HOSPITAL_COMMUNITY): Payer: Self-pay

## 2021-02-01 ENCOUNTER — Encounter (HOSPITAL_COMMUNITY)
Admission: RE | Admit: 2021-02-01 | Discharge: 2021-02-01 | Disposition: A | Discharge status: Home / Self Care | Payer: Federal, State, Local not specified - PPO | Source: Ambulatory Visit | Attending: Surgery | Admitting: Surgery

## 2021-02-01 ENCOUNTER — Other Ambulatory Visit: Payer: Self-pay

## 2021-02-01 ENCOUNTER — Other Ambulatory Visit (HOSPITAL_COMMUNITY): Payer: Self-pay

## 2021-02-01 ENCOUNTER — Other Ambulatory Visit: Payer: Self-pay | Admitting: Surgery

## 2021-02-01 DIAGNOSIS — Z20822 Contact with and (suspected) exposure to covid-19: Secondary | ICD-10-CM | POA: Insufficient documentation

## 2021-02-01 DIAGNOSIS — Z8249 Family history of ischemic heart disease and other diseases of the circulatory system: Secondary | ICD-10-CM | POA: Diagnosis not present

## 2021-02-01 DIAGNOSIS — R252 Cramp and spasm: Secondary | ICD-10-CM | POA: Diagnosis not present

## 2021-02-01 DIAGNOSIS — Z01812 Encounter for preprocedural laboratory examination: Secondary | ICD-10-CM | POA: Insufficient documentation

## 2021-02-01 DIAGNOSIS — Z833 Family history of diabetes mellitus: Secondary | ICD-10-CM | POA: Diagnosis not present

## 2021-02-01 DIAGNOSIS — Z823 Family history of stroke: Secondary | ICD-10-CM | POA: Diagnosis not present

## 2021-02-01 DIAGNOSIS — Z87442 Personal history of urinary calculi: Secondary | ICD-10-CM | POA: Diagnosis not present

## 2021-02-01 DIAGNOSIS — Z832 Family history of diseases of the blood and blood-forming organs and certain disorders involving the immune mechanism: Secondary | ICD-10-CM | POA: Diagnosis not present

## 2021-02-01 DIAGNOSIS — Z825 Family history of asthma and other chronic lower respiratory diseases: Secondary | ICD-10-CM | POA: Diagnosis not present

## 2021-02-01 DIAGNOSIS — Z6841 Body Mass Index (BMI) 40.0 and over, adult: Secondary | ICD-10-CM | POA: Diagnosis not present

## 2021-02-01 DIAGNOSIS — Z885 Allergy status to narcotic agent status: Secondary | ICD-10-CM | POA: Diagnosis not present

## 2021-02-01 HISTORY — DX: Personal history of urinary calculi: Z87.442

## 2021-02-01 HISTORY — DX: Prediabetes: R73.03

## 2021-02-01 HISTORY — DX: Unspecified asthma, uncomplicated: J45.909

## 2021-02-01 LAB — COMPREHENSIVE METABOLIC PANEL
ALT: 11 U/L (ref 0–44)
AST: 15 U/L (ref 15–41)
Albumin: 3.8 g/dL (ref 3.5–5.0)
Alkaline Phosphatase: 91 U/L (ref 38–126)
Anion gap: 8 (ref 5–15)
BUN: 14 mg/dL (ref 6–20)
CO2: 27 mmol/L (ref 22–32)
Calcium: 9.1 mg/dL (ref 8.9–10.3)
Chloride: 101 mmol/L (ref 98–111)
Creatinine, Ser: 0.66 mg/dL (ref 0.44–1.00)
GFR, Estimated: >60 mL/min (ref >60)
Glucose, Bld: 83 mg/dL (ref 70–99)
Potassium: 3.8 mmol/L (ref 3.5–5.1)
Sodium: 136 mmol/L (ref 135–145)
Total Bilirubin: 0.6 mg/dL (ref 0.3–1.2)
Total Protein: 7.8 g/dL (ref 6.5–8.1)

## 2021-02-01 LAB — CBC WITH DIFFERENTIAL/PLATELET
Abs Immature Granulocytes: 0.02 10*3/uL (ref 0.00–0.07)
Basophils Absolute: 0.1 10*3/uL (ref 0.0–0.1)
Basophils Relative: 1 %
Eosinophils Absolute: 0.2 10*3/uL (ref 0.0–0.5)
Eosinophils Relative: 2 %
HCT: 35.3 % — ABNORMAL LOW (ref 36.0–46.0)
Hemoglobin: 10.3 g/dL — ABNORMAL LOW (ref 12.0–15.0)
Immature Granulocytes: 0 %
Lymphocytes Relative: 22 %
Lymphs Abs: 2 10*3/uL (ref 0.7–4.0)
MCH: 22.2 pg — ABNORMAL LOW (ref 26.0–34.0)
MCHC: 29.2 g/dL — ABNORMAL LOW (ref 30.0–36.0)
MCV: 76.1 fL — ABNORMAL LOW (ref 80.0–100.0)
Monocytes Absolute: 0.6 10*3/uL (ref 0.1–1.0)
Monocytes Relative: 7 %
Neutro Abs: 6.3 10*3/uL (ref 1.7–7.7)
Neutrophils Relative %: 68 %
Platelets: 317 10*3/uL (ref 150–400)
RBC: 4.64 MIL/uL (ref 3.87–5.11)
RDW: 18.3 % — ABNORMAL HIGH (ref 11.5–15.5)
WBC: 9.2 10*3/uL (ref 4.0–10.5)
nRBC: 0 % (ref 0.0–0.2)

## 2021-02-01 LAB — HEMOGLOBIN A1C
Hgb A1c MFr Bld: 5.7 % — ABNORMAL HIGH (ref 4.8–5.6)
Mean Plasma Glucose: 116.89 mg/dL

## 2021-02-01 NOTE — Progress Notes (Signed)
COVID Vaccine Completed: Yes Date COVID Vaccine completed: 02/2020. X 2 COVID vaccine manufacturer:  Moderna    Covid test: 02/01/21 PCP - Marcelino Duster Flinchum: FNP Cardiologist -   Chest x-ray -  EKG - 08/20/20 EPIC Stress Test -  ECHO -  Cardiac Cath -  Pacemaker/ICD device last checked:  Sleep Study -  CPAP -   Fasting Blood Sugar -  Checks Blood Sugar _____ times a day  Blood Thinner Instructions: Aspirin Instructions: Last Dose:  Anesthesia review:   Patient denies shortness of breath, fever, cough and chest pain at PAT appointment   Patient verbalized understanding of instructions that were given to them at the PAT appointment. Patient was also instructed that they will need to review over the PAT instructions again at home before surgery.

## 2021-02-02 LAB — SARS CORONAVIRUS 2 (TAT 6-24 HRS): SARS Coronavirus 2: NEGATIVE

## 2021-02-03 MED ORDER — BUPIVACAINE LIPOSOME 1.3 % IJ SUSP
20.0000 mL | Freq: Once | INTRAMUSCULAR | Status: DC
  Filled 2021-02-03: qty 20

## 2021-02-04 ENCOUNTER — Encounter (HOSPITAL_COMMUNITY)
Admission: RE | Disposition: A | Discharge status: Home / Self Care | Payer: Self-pay | Source: Home / Self Care | Attending: Surgery

## 2021-02-04 ENCOUNTER — Encounter (HOSPITAL_COMMUNITY): Payer: Self-pay | Admitting: Surgery

## 2021-02-04 ENCOUNTER — Inpatient Hospital Stay (HOSPITAL_COMMUNITY): Payer: Federal, State, Local not specified - PPO | Admitting: Anesthesiology

## 2021-02-04 ENCOUNTER — Other Ambulatory Visit: Payer: Self-pay

## 2021-02-04 ENCOUNTER — Inpatient Hospital Stay (HOSPITAL_COMMUNITY)
Admission: RE | Admit: 2021-02-04 | Discharge: 2021-02-06 | DRG: 621 | Disposition: A | Discharge status: Home / Self Care | Payer: Federal, State, Local not specified - PPO | Attending: Surgery | Admitting: Surgery

## 2021-02-04 DIAGNOSIS — R252 Cramp and spasm: Secondary | ICD-10-CM | POA: Diagnosis not present

## 2021-02-04 DIAGNOSIS — Z8249 Family history of ischemic heart disease and other diseases of the circulatory system: Secondary | ICD-10-CM

## 2021-02-04 DIAGNOSIS — Z87442 Personal history of urinary calculi: Secondary | ICD-10-CM

## 2021-02-04 DIAGNOSIS — Z6841 Body Mass Index (BMI) 40.0 and over, adult: Secondary | ICD-10-CM

## 2021-02-04 DIAGNOSIS — Z833 Family history of diabetes mellitus: Secondary | ICD-10-CM

## 2021-02-04 DIAGNOSIS — Z823 Family history of stroke: Secondary | ICD-10-CM

## 2021-02-04 DIAGNOSIS — Z832 Family history of diseases of the blood and blood-forming organs and certain disorders involving the immune mechanism: Secondary | ICD-10-CM | POA: Diagnosis not present

## 2021-02-04 DIAGNOSIS — I10 Essential (primary) hypertension: Secondary | ICD-10-CM | POA: Diagnosis not present

## 2021-02-04 DIAGNOSIS — Z825 Family history of asthma and other chronic lower respiratory diseases: Secondary | ICD-10-CM

## 2021-02-04 DIAGNOSIS — R7303 Prediabetes: Secondary | ICD-10-CM | POA: Diagnosis not present

## 2021-02-04 DIAGNOSIS — Z885 Allergy status to narcotic agent status: Secondary | ICD-10-CM

## 2021-02-04 DIAGNOSIS — E559 Vitamin D deficiency, unspecified: Secondary | ICD-10-CM | POA: Diagnosis not present

## 2021-02-04 HISTORY — PX: UPPER GI ENDOSCOPY: SHX6162

## 2021-02-04 HISTORY — PX: LAPAROSCOPIC GASTRIC SLEEVE RESECTION: SHX5895

## 2021-02-04 LAB — TYPE AND SCREEN
ABO/RH(D): A POS
Antibody Screen: NEGATIVE

## 2021-02-04 LAB — PREGNANCY, URINE: Preg Test, Ur: NEGATIVE

## 2021-02-04 SURGERY — GASTRECTOMY, SLEEVE, LAPAROSCOPIC
Anesthesia: General | Site: Abdomen

## 2021-02-04 MED ORDER — LIDOCAINE 2% (20 MG/ML) 5 ML SYRINGE
INTRAMUSCULAR | Status: DC | PRN
  Administered 2021-02-04: 80 mg via INTRAVENOUS

## 2021-02-04 MED ORDER — FENTANYL CITRATE (PF) 100 MCG/2ML IJ SOLN
INTRAMUSCULAR | Status: DC | PRN
  Administered 2021-02-04 (×3): 50 ug via INTRAVENOUS
  Administered 2021-02-04: 100 ug via INTRAVENOUS

## 2021-02-04 MED ORDER — METHOCARBAMOL 500 MG IVPB - SIMPLE MED
500.0000 mg | Freq: Four times a day (QID) | INTRAVENOUS | Status: DC | PRN
  Filled 2021-02-04 (×2): qty 50

## 2021-02-04 MED ORDER — HEPARIN SODIUM (PORCINE) 5000 UNIT/ML IJ SOLN
5000.0000 [IU] | INTRAMUSCULAR | Status: AC
  Administered 2021-02-04: 5000 [IU] via SUBCUTANEOUS
  Filled 2021-02-04: qty 1

## 2021-02-04 MED ORDER — MIDAZOLAM HCL 5 MG/5ML IJ SOLN
INTRAMUSCULAR | Status: DC | PRN
Start: 2021-02-04 — End: 2021-02-04
  Administered 2021-02-04: 2 mg via INTRAVENOUS

## 2021-02-04 MED ORDER — CHLORHEXIDINE GLUCONATE 0.12 % MT SOLN
15.0000 mL | Freq: Once | OROMUCOSAL | Status: AC
  Administered 2021-02-04: 15 mL via OROMUCOSAL

## 2021-02-04 MED ORDER — PROPOFOL 10 MG/ML IV BOLUS
INTRAVENOUS | Status: AC
  Filled 2021-02-04: qty 20

## 2021-02-04 MED ORDER — ACETAMINOPHEN 160 MG/5ML PO SOLN: 1000.0000 mg | Freq: Three times a day (TID) | ORAL | Status: DC

## 2021-02-04 MED ORDER — BUPIVACAINE-EPINEPHRINE (PF) 0.25% -1:200000 IJ SOLN
INTRAMUSCULAR | Status: AC
  Filled 2021-02-04: qty 30

## 2021-02-04 MED ORDER — GABAPENTIN 100 MG PO CAPS
200.0000 mg | ORAL_CAPSULE | Freq: Two times a day (BID) | ORAL | Status: DC
  Administered 2021-02-04 – 2021-02-06 (×4): 200 mg via ORAL
  Filled 2021-02-04 (×4): qty 2

## 2021-02-04 MED ORDER — SODIUM CHLORIDE 0.9 % IV SOLN: INTRAVENOUS | Status: DC

## 2021-02-04 MED ORDER — GABAPENTIN 300 MG PO CAPS
300.0000 mg | ORAL_CAPSULE | ORAL | Status: AC
  Administered 2021-02-04: 300 mg via ORAL
  Filled 2021-02-04: qty 1

## 2021-02-04 MED ORDER — BUPIVACAINE LIPOSOME 1.3 % IJ SUSP
INTRAMUSCULAR | Status: DC | PRN
  Administered 2021-02-04: 20 mL

## 2021-02-04 MED ORDER — DEXAMETHASONE SODIUM PHOSPHATE 10 MG/ML IJ SOLN
INTRAMUSCULAR | Status: DC | PRN
  Administered 2021-02-04: 5 mg via INTRAVENOUS

## 2021-02-04 MED ORDER — ONDANSETRON HCL 4 MG/2ML IJ SOLN
INTRAMUSCULAR | Status: DC | PRN
  Administered 2021-02-04: 4 mg via INTRAVENOUS

## 2021-02-04 MED ORDER — ENSURE MAX PROTEIN PO LIQD
2.0000 [oz_av] | ORAL | Status: DC
  Administered 2021-02-05 – 2021-02-06 (×10): 2 [oz_av] via ORAL

## 2021-02-04 MED ORDER — METOPROLOL TARTRATE 5 MG/5ML IV SOLN: 5.0000 mg | Freq: Four times a day (QID) | INTRAVENOUS | Status: DC | PRN

## 2021-02-04 MED ORDER — SUGAMMADEX SODIUM 200 MG/2ML IV SOLN
INTRAVENOUS | Status: DC | PRN
Start: 2021-02-04 — End: 2021-02-04
  Administered 2021-02-04: 200 mg via INTRAVENOUS

## 2021-02-04 MED ORDER — DOCUSATE SODIUM 100 MG PO CAPS
100.0000 mg | ORAL_CAPSULE | Freq: Two times a day (BID) | ORAL | Status: DC
  Administered 2021-02-04 – 2021-02-06 (×4): 100 mg via ORAL
  Filled 2021-02-04 (×4): qty 1

## 2021-02-04 MED ORDER — SIMETHICONE 80 MG PO CHEW
80.0000 mg | CHEWABLE_TABLET | Freq: Four times a day (QID) | ORAL | Status: DC | PRN
  Administered 2021-02-05: 80 mg via ORAL
  Filled 2021-02-04: qty 1

## 2021-02-04 MED ORDER — LACTATED RINGERS IV SOLN: INTRAVENOUS | Status: DC

## 2021-02-04 MED ORDER — MIDAZOLAM HCL 2 MG/2ML IJ SOLN
INTRAMUSCULAR | Status: AC
  Filled 2021-02-04: qty 2

## 2021-02-04 MED ORDER — ONDANSETRON HCL 4 MG/2ML IJ SOLN
4.0000 mg | INTRAMUSCULAR | Status: DC | PRN
  Administered 2021-02-04: 4 mg via INTRAVENOUS
  Filled 2021-02-04: qty 2

## 2021-02-04 MED ORDER — CHLORHEXIDINE GLUCONATE CLOTH 2 % EX PADS: 6.0000 | MEDICATED_PAD | Freq: Once | CUTANEOUS | Status: DC

## 2021-02-04 MED ORDER — APREPITANT 40 MG PO CAPS
40.0000 mg | ORAL_CAPSULE | ORAL | Status: AC
  Administered 2021-02-04: 40 mg via ORAL
  Filled 2021-02-04: qty 1

## 2021-02-04 MED ORDER — HYDRALAZINE HCL 20 MG/ML IJ SOLN: 10.0000 mg | INTRAMUSCULAR | Status: DC | PRN

## 2021-02-04 MED ORDER — LACTATED RINGERS IR SOLN
Status: DC | PRN
  Administered 2021-02-04: 1000 mL

## 2021-02-04 MED ORDER — METOCLOPRAMIDE HCL 5 MG/ML IJ SOLN
10.0000 mg | Freq: Four times a day (QID) | INTRAMUSCULAR | Status: DC
  Administered 2021-02-04 – 2021-02-05 (×4): 10 mg via INTRAVENOUS
  Filled 2021-02-04 (×4): qty 2

## 2021-02-04 MED ORDER — TRAMADOL HCL 50 MG PO TABS
50.0000 mg | ORAL_TABLET | Freq: Four times a day (QID) | ORAL | Status: DC | PRN
  Administered 2021-02-04 – 2021-02-05 (×3): 50 mg via ORAL
  Filled 2021-02-04 (×3): qty 1

## 2021-02-04 MED ORDER — PROMETHAZINE HCL 25 MG/ML IJ SOLN: 6.2500 mg | INTRAMUSCULAR | Status: DC | PRN

## 2021-02-04 MED ORDER — PROPOFOL 10 MG/ML IV BOLUS
INTRAVENOUS | Status: DC | PRN
  Administered 2021-02-04: 200 mg via INTRAVENOUS

## 2021-02-04 MED ORDER — LIDOCAINE 2% (20 MG/ML) 5 ML SYRINGE
INTRAMUSCULAR | Status: DC | PRN
  Administered 2021-02-04: 1.5 mg/kg/h via INTRAVENOUS

## 2021-02-04 MED ORDER — ORAL CARE MOUTH RINSE: 15.0000 mL | Freq: Once | OROMUCOSAL | Status: AC

## 2021-02-04 MED ORDER — FENTANYL CITRATE (PF) 250 MCG/5ML IJ SOLN
INTRAMUSCULAR | Status: AC
  Filled 2021-02-04: qty 5

## 2021-02-04 MED ORDER — STERILE WATER FOR IRRIGATION IR SOLN
Status: DC | PRN
  Administered 2021-02-04: 1000 mL

## 2021-02-04 MED ORDER — KETOROLAC TROMETHAMINE 15 MG/ML IJ SOLN
15.0000 mg | Freq: Three times a day (TID) | INTRAMUSCULAR | Status: DC | PRN
  Administered 2021-02-04: 15 mg via INTRAVENOUS
  Filled 2021-02-04: qty 1

## 2021-02-04 MED ORDER — OXYCODONE HCL 5 MG/5ML PO SOLN
5.0000 mg | Freq: Four times a day (QID) | ORAL | Status: DC | PRN
  Administered 2021-02-05 (×2): 5 mg via ORAL
  Filled 2021-02-04 (×3): qty 5

## 2021-02-04 MED ORDER — 0.9 % SODIUM CHLORIDE (POUR BTL) OPTIME
TOPICAL | Status: DC | PRN
  Administered 2021-02-04: 1000 mL

## 2021-02-04 MED ORDER — DEXMEDETOMIDINE (PRECEDEX) IN NS 20 MCG/5ML (4 MCG/ML) IV SYRINGE
PREFILLED_SYRINGE | INTRAVENOUS | Status: AC
  Filled 2021-02-04: qty 10

## 2021-02-04 MED ORDER — BUPIVACAINE-EPINEPHRINE 0.25% -1:200000 IJ SOLN
INTRAMUSCULAR | Status: DC | PRN
  Administered 2021-02-04: 30 mL

## 2021-02-04 MED ORDER — FENTANYL CITRATE PF 50 MCG/ML IJ SOSY: 25.0000 ug | PREFILLED_SYRINGE | INTRAMUSCULAR | Status: DC | PRN

## 2021-02-04 MED ORDER — ONDANSETRON HCL 4 MG/2ML IJ SOLN
INTRAMUSCULAR | Status: AC
  Filled 2021-02-04: qty 2

## 2021-02-04 MED ORDER — DEXMEDETOMIDINE HCL IN NACL 200 MCG/50ML IV SOLN
INTRAVENOUS | Status: DC | PRN
  Administered 2021-02-04: 12 ug via INTRAVENOUS

## 2021-02-04 MED ORDER — PANTOPRAZOLE SODIUM 40 MG IV SOLR
40.0000 mg | Freq: Every day | INTRAVENOUS | Status: DC
  Administered 2021-02-04 – 2021-02-05 (×2): 40 mg via INTRAVENOUS
  Filled 2021-02-04 (×2): qty 40

## 2021-02-04 MED ORDER — ROCURONIUM BROMIDE 10 MG/ML (PF) SYRINGE
PREFILLED_SYRINGE | INTRAVENOUS | Status: DC | PRN
  Administered 2021-02-04: 60 mg via INTRAVENOUS

## 2021-02-04 MED ORDER — ACETAMINOPHEN 500 MG PO TABS
1000.0000 mg | ORAL_TABLET | ORAL | Status: AC
  Administered 2021-02-04: 1000 mg via ORAL
  Filled 2021-02-04: qty 2

## 2021-02-04 MED ORDER — ACETAMINOPHEN 500 MG PO TABS
1000.0000 mg | ORAL_TABLET | Freq: Three times a day (TID) | ORAL | Status: DC
  Administered 2021-02-04 – 2021-02-06 (×5): 1000 mg via ORAL
  Filled 2021-02-04 (×6): qty 2

## 2021-02-04 MED ORDER — KETAMINE HCL 50 MG/ML IJ SOLN
INTRAMUSCULAR | Status: DC | PRN
  Administered 2021-02-04: 30 mg via INTRAMUSCULAR

## 2021-02-04 MED ORDER — ENOXAPARIN SODIUM 30 MG/0.3ML IJ SOSY
30.0000 mg | PREFILLED_SYRINGE | Freq: Two times a day (BID) | INTRAMUSCULAR | Status: DC
  Administered 2021-02-04 – 2021-02-06 (×4): 30 mg via SUBCUTANEOUS
  Filled 2021-02-04 (×4): qty 0.3

## 2021-02-04 MED ORDER — SODIUM CHLORIDE 0.9 % IV SOLN
2.0000 g | INTRAVENOUS | Status: AC
  Administered 2021-02-04: 2 g via INTRAVENOUS
  Filled 2021-02-04: qty 2

## 2021-02-04 MED ORDER — SCOPOLAMINE 1 MG/3DAYS TD PT72
1.0000 | MEDICATED_PATCH | TRANSDERMAL | Status: DC
  Administered 2021-02-04: 1.5 mg via TRANSDERMAL
  Filled 2021-02-04: qty 1

## 2021-02-04 MED ORDER — FENTANYL CITRATE PF 50 MCG/ML IJ SOSY
25.0000 ug | PREFILLED_SYRINGE | Freq: Four times a day (QID) | INTRAMUSCULAR | Status: DC | PRN
  Administered 2021-02-04: 25 ug via INTRAVENOUS
  Filled 2021-02-04: qty 1

## 2021-02-04 SURGICAL SUPPLY — 63 items
APPLIER CLIP ROT 10 11.4 M/L (STAPLE)
APPLIER CLIP ROT 13.4 12 LRG (CLIP)
BAG COUNTER SPONGE SURGICOUNT (BAG) IMPLANT
BAG LAPAROSCOPIC 12 15 PORT 16 (BASKET) IMPLANT
BAG RETRIEVAL 12/15 (BASKET)
BENZOIN TINCTURE PRP APPL 2/3 (GAUZE/BANDAGES/DRESSINGS) ×3 IMPLANT
BLADE SURG SZ11 CARB STEEL (BLADE) ×3 IMPLANT
BNDG ADH 1X3 SHEER STRL LF (GAUZE/BANDAGES/DRESSINGS) ×18 IMPLANT
CABLE HIGH FREQUENCY MONO STRZ (ELECTRODE) ×3 IMPLANT
CHLORAPREP W/TINT 26 (MISCELLANEOUS) ×3 IMPLANT
CLIP APPLIE ROT 10 11.4 M/L (STAPLE) IMPLANT
CLIP APPLIE ROT 13.4 12 LRG (CLIP) IMPLANT
COVER SURGICAL LIGHT HANDLE (MISCELLANEOUS) ×3 IMPLANT
DECANTER SPIKE VIAL GLASS SM (MISCELLANEOUS) ×3 IMPLANT
DEVICE SUT QUICK LOAD TK 5 (STAPLE) IMPLANT
DEVICE SUT TI-KNOT TK 5X26 (MISCELLANEOUS) IMPLANT
DRAPE UTILITY XL STRL (DRAPES) ×6 IMPLANT
ELECT REM PT RETURN 15FT ADLT (MISCELLANEOUS) ×3 IMPLANT
GAUZE SPONGE 4X4 12PLY STRL (GAUZE/BANDAGES/DRESSINGS) IMPLANT
GLOVE SURG ENC MOIS LTX SZ6 (GLOVE) ×3 IMPLANT
GLOVE SURG MICRO LTX SZ6 (GLOVE) ×3 IMPLANT
GLOVE SURG UNDER LTX SZ6.5 (GLOVE) ×3 IMPLANT
GOWN STRL REUS W/TWL LRG LVL3 (GOWN DISPOSABLE) ×6 IMPLANT
GOWN STRL REUS W/TWL XL LVL3 (GOWN DISPOSABLE) ×6 IMPLANT
GRASPER SUT TROCAR 14GX15 (MISCELLANEOUS) ×3 IMPLANT
KIT BASIN OR (CUSTOM PROCEDURE TRAY) ×3 IMPLANT
KIT TURNOVER KIT A (KITS) ×3 IMPLANT
MARKER SKIN DUAL TIP RULER LAB (MISCELLANEOUS) ×3 IMPLANT
MAT PREVALON FULL STRYKER (MISCELLANEOUS) ×3 IMPLANT
NEEDLE SPNL 22GX3.5 QUINCKE BK (NEEDLE) ×3 IMPLANT
PACK UNIVERSAL I (CUSTOM PROCEDURE TRAY) ×3 IMPLANT
RELOAD ENDO STITCH (ENDOMECHANICALS) IMPLANT
RELOAD STAPLER BLUE 60MM (STAPLE) ×8 IMPLANT
RELOAD STAPLER GOLD 60MM (STAPLE) ×2 IMPLANT
RELOAD STAPLER GREEN 60MM (STAPLE) ×2 IMPLANT
SCISSORS LAP 5X45 EPIX DISP (ENDOMECHANICALS) ×3 IMPLANT
SET IRRIG TUBING LAPAROSCOPIC (IRRIGATION / IRRIGATOR) ×3 IMPLANT
SET TUBE SMOKE EVAC HIGH FLOW (TUBING) ×3 IMPLANT
SHEARS HARMONIC ACE PLUS 45CM (MISCELLANEOUS) ×3 IMPLANT
SLEEVE ADV FIXATION 5X100MM (TROCAR) ×6 IMPLANT
SLEEVE GASTRECTOMY 40FR VISIGI (MISCELLANEOUS) ×3 IMPLANT
SOL ANTI FOG 6CC (MISCELLANEOUS) ×2 IMPLANT
SOLUTION ANTI FOG 6CC (MISCELLANEOUS) ×1
SPONGE T-LAP 18X18 ~~LOC~~+RFID (SPONGE) ×3 IMPLANT
STAPLER ECHELON BIOABSB 60 FLE (MISCELLANEOUS) ×9 IMPLANT
STAPLER ECHELON LONG 60 440 (INSTRUMENTS) ×3 IMPLANT
STAPLER RELOAD BLUE 60MM (STAPLE) ×12
STAPLER RELOAD GOLD 60MM (STAPLE) ×3
STAPLER RELOAD GREEN 60MM (STAPLE) ×3
STRIP CLOSURE SKIN 1/2X4 (GAUZE/BANDAGES/DRESSINGS) ×3 IMPLANT
SUT MNCRL AB 4-0 PS2 18 (SUTURE) ×3 IMPLANT
SUT SURGIDAC NAB ES-9 0 48 120 (SUTURE) IMPLANT
SUT VICRYL 0 TIES 12 18 (SUTURE) ×3 IMPLANT
SYR 10ML ECCENTRIC (SYRINGE) ×3 IMPLANT
SYR 20ML LL LF (SYRINGE) ×3 IMPLANT
SYR 50ML LL SCALE MARK (SYRINGE) IMPLANT
TOWEL OR 17X26 10 PK STRL BLUE (TOWEL DISPOSABLE) ×3 IMPLANT
TOWEL OR NON WOVEN STRL DISP B (DISPOSABLE) ×3 IMPLANT
TROCAR ADV FIXATION 5X100MM (TROCAR) ×3 IMPLANT
TROCAR BLADELESS 15MM (ENDOMECHANICALS) ×3 IMPLANT
TROCAR BLADELESS OPT 5 100 (ENDOMECHANICALS) ×3 IMPLANT
TUBING CONNECTING 10 (TUBING) ×3 IMPLANT
TUBING ENDO SMARTCAP (MISCELLANEOUS) IMPLANT

## 2021-02-04 NOTE — Anesthesia Postprocedure Evaluation (Signed)
Anesthesia Post Note  Patient: April Davenport  Procedure(s) Performed: LAPAROSCOPIC GASTRIC SLEEVE RESECTION (Abdomen) UPPER GI ENDOSCOPY     Patient location during evaluation: PACU Anesthesia Type: General Level of consciousness: awake and alert Pain management: pain level controlled Vital Signs Assessment: post-procedure vital signs reviewed and stable Respiratory status: spontaneous breathing, nonlabored ventilation, respiratory function stable and patient connected to nasal cannula oxygen Cardiovascular status: blood pressure returned to baseline and stable Postop Assessment: no apparent nausea or vomiting Anesthetic complications: no   No notable events documented.  Last Vitals:  Vitals:   02/04/21 1100 02/04/21 1140  BP: (!) 138/96 (!) 147/87  Pulse: 90 88  Resp: 17 20  Temp:  (!) 36.3 C  SpO2: 100% 100%    Last Pain:  Vitals:   02/04/21 1140  TempSrc: Oral  PainSc:                  Nelle Don Maitland Les[place]ak

## 2021-02-04 NOTE — Transfer of Care (Signed)
Immediate Anesthesia Transfer of Care Note  Patient: April Davenport  Procedure(s) Performed: LAPAROSCOPIC GASTRIC SLEEVE RESECTION (Abdomen) UPPER GI ENDOSCOPY  Patient Location: PACU  Anesthesia Type:General  Level of Consciousness: sedated, patient cooperative and responds to stimulation  Airway & Oxygen Therapy: Patient Spontanous Breathing and Patient connected to face mask oxygen  Post-op Assessment: Report given to RN and Post -op Vital signs reviewed and stable  Post vital signs: Reviewed and stable  Last Vitals:  Vitals Value Taken Time  BP 119/79 02/04/21 1041  Temp    Pulse 85 02/04/21 1043  Resp 15 02/04/21 1043  SpO2 100 % 02/04/21 1043  Vitals shown include unvalidated device data.  Last Pain:  Vitals:   02/04/21 0827  TempSrc: Oral  PainSc:          Complications: No notable events documented.

## 2021-02-04 NOTE — Progress Notes (Signed)
Pt ambulated in hallway, started water, and used her incentive for the first time.

## 2021-02-04 NOTE — Op Note (Signed)
Preoperative diagnosis: laparoscopic sleeve gastrectomy  Postoperative diagnosis: Same   Procedure: Upper endoscopy   Surgeon: Amarii Amy, M.D.  Anesthesia: Gen.   Indications for procedure: This patient was undergoing a laparoscopic sleeve gastrectomy.   Description of procedure: The endoscopy was placed in the mouth and into the oropharynx and under endoscopic vision it was advanced to the esophagogastric junction.  The stomach was insufflated and no bleeding or bubbles were seen.  The GEJ was identified at 35 cm from the teeth. No bleeding or leaks were detected. The scope was withdrawn without difficulty.    Emeri Estill, M.D. General, Bariatric, & Minimally Invasive Surgery [facility]   

## 2021-02-04 NOTE — Interval H&P Note (Signed)
History and Physical Interval Note:  02/04/2021 8:38 AM  April Davenport  has presented today for surgery, with the diagnosis of MORBID OBESITY.  The various methods of treatment have been discussed with the patient and family. After consideration of risks, benefits and other options for treatment, the patient has consented to  Procedure(s): LAPAROSCOPIC GASTRIC SLEEVE RESECTION (N/A) UPPER GI ENDOSCOPY (N/A) as a surgical intervention.  The patient's history has been reviewed, patient examined, no change in status, stable for surgery.  I have reviewed the patient's chart and labs.  Questions were answered to the patient's satisfaction.     Ursala Cressy Lollie Sails

## 2021-02-04 NOTE — Anesthesia Procedure Notes (Signed)
Procedure Name: Intubation Date/Time: 02/04/2021 9:30 AM Performed by: Gean Maidens, CRNA Pre-anesthesia Checklist: Patient identified, Emergency Drugs available, Suction available, Patient being monitored and Timeout performed Patient Re-evaluated:Patient Re-evaluated prior to induction Oxygen Delivery Method: Circle system utilized Preoxygenation: Pre-oxygenation with 100% oxygen Induction Type: IV induction Ventilation: Mask ventilation without difficulty Laryngoscope Size: Mac Grade View: Grade I Tube type: Oral Tube size: 7.0 mm Number of attempts: 1 Airway Equipment and Method: Stylet Placement Confirmation: ETT inserted through vocal cords under direct vision, positive ETCO2 and breath sounds checked- equal and bilateral Secured at: 21 cm Tube secured with: Tape Dental Injury: Teeth and Oropharynx as per pre-operative assessment

## 2021-02-04 NOTE — Anesthesia Preprocedure Evaluation (Signed)
Anesthesia Evaluation  Patient identified by MRN, date of birth, ID band Patient awake    Reviewed: Allergy & Precautions, NPO status , Patient's Chart, lab work & pertinent test results  Airway Mallampati: II  TM Distance: >3 FB Neck ROM: Full    Dental  (+) Teeth Intact   Pulmonary asthma ,    Pulmonary exam normal        Cardiovascular hypertension,  Rhythm:Regular Rate:Normal     Neuro/Psych Anxiety Depression negative neurological ROS     GI/Hepatic negative GI ROS, Neg liver ROS,   Endo/Other  Morbid obesity  Renal/GU   negative genitourinary   Musculoskeletal negative musculoskeletal ROS (+)   Abdominal (+)  Abdomen: soft. Bowel sounds: normal.  Peds  Hematology  (+) anemia ,   Anesthesia Other Findings   Reproductive/Obstetrics negative OB ROS                             Anesthesia Physical Anesthesia Plan  ASA: 3  Anesthesia Plan: General   Post-op Pain Management:    Induction: Intravenous  PONV Risk Score and Plan: 3 and Ondansetron, Dexamethasone, Midazolam and Treatment may vary due to age or medical condition  Airway Management Planned: Mask and Oral ETT  Additional Equipment: None  Intra-op Plan:   Post-operative Plan: Extubation in OR  Informed Consent: I have reviewed the patients History and Physical, chart, labs and discussed the procedure including the risks, benefits and alternatives for the proposed anesthesia with the patient or authorized representative who has indicated his/her understanding and acceptance.     Dental advisory given  Plan Discussed with: CRNA  Anesthesia Plan Comments: (Lab Results      Component                Value               Date                      WBC                      9.2                 02/01/2021                HGB                      10.3 (L)            02/01/2021                HCT                       35.3 (L)            02/01/2021                MCV                      76.1 (L)            02/01/2021                PLT                      317  02/01/2021           Lab Results      Component                Value               Date                      NA                       136                 02/01/2021                K                        3.8                 02/01/2021                CO2                      27                  02/01/2021                GLUCOSE                  83                  02/01/2021                BUN                      14                  02/01/2021                CREATININE               0.66                02/01/2021                CALCIUM                  9.1                 02/01/2021                GFRNONAA                 >60                 02/01/2021                GFRAA                    >60                 11/10/2019           )        Anesthesia Quick Evaluation

## 2021-02-04 NOTE — Progress Notes (Signed)
Discussed QI "Goals for Discharge" document with patient and family including ambulation in halls, Incentive Spirometry use every hour, and oral care.  Also discussed pain and nausea control.  Enabled or verified head of bed 30 degree alarm activated.  BSTOP education provided including BSTOP information guide, "Guide for Pain Management after your Bariatric Procedure".  Diet progression education provided including "Bariatric Surgery Post-Op Food Plan Phase 1: Liquids".  Questions answered.  Will continue to partner with bedside RN and follow up with patient per protocol.  ?

## 2021-02-04 NOTE — Progress Notes (Signed)
PHARMACY CONSULT FOR:  Risk Assessment for Post-Discharge VTE Following Bariatric Surgery  Post-Discharge VTE Risk Assessment: This patient's probability of 30-day post-discharge VTE is increased due to the factors marked:   Female    Age >/=60 years    BMI >/=50 kg/m2    CHF    Dyspnea at Rest    Paraplegia    Non-gastric-band surgery    Operation Time >/=3 hr    Return to OR     Length of Stay >/= 3 d   Hx of VTE   Hypercoagulable condition   Significant venous stasis       Predicted probability of 30-day post-discharge VTE: 0.16%  Other patient-specific factors to consider:   Recommendation for Discharge: No pharmacologic prophylaxis post-discharge     April Davenport is a 30 y.o. female who underwent laparoscopic sleeve gastrectomy on 8/29   Case start: 0935 Case end: 1030   Allergies  Allergen Reactions   Dilaudid [Hydromorphone Hcl] Hives, Shortness Of Breath and Itching    Syncope   Black Walnut Flavor Hives and Swelling   Coconut Flavor Swelling    Patient Measurements: Height: 5' (152.4 cm) Weight: 97.3 kg (214 lb 6.4 oz) IBW/kg (Calculated) : 45.5 Body mass index is 41.87 kg/m.  No results for input(s): WBC, HGB, HCT, PLT, APTT, CREATININE, LABCREA, CREATININE, CREAT24HRUR, MG, PHOS, ALBUMIN, PROT, ALBUMIN, AST, ALT, ALKPHOS, BILITOT, BILIDIR, IBILI in the last 72 hours. Estimated Creatinine Clearance: 106.5 mL/min (by C-G formula based on SCr of 0.66 mg/dL).    Past Medical History:  Diagnosis Date   Allergy    Anemia    Antepartum mild preeclampsia 05/15/2013   Persistent mild range blood pressures.  24 hour urine protein with 479g.  Plan for twice weekly NST, weekly AFI, and delivery at 37 weeks unless concern for worsening disease.  Formatting of this note might be different from the original. Persistent mild range blood pressures.  24 hour urine protein with 479g.  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 C/s 05/16/13 at 35 weeks for severe preeclampsia, baby girl 5#4 - d/c'd on labetalol, procardia added 05/24/13   Right nephrolithiasis      Medications Prior to Admission  Medication Sig Dispense Refill Last Dose   cholecalciferol (VITAMIN D3) 25 MCG (1000 UNIT) tablet Take 1,000 Units by mouth daily.   Past Month   ferrous sulfate 325 (65 FE) MG tablet Take 325 mg by mouth daily with breakfast.   Past Month       Berkley Harvey 02/04/2021,12:12 PM

## 2021-02-04 NOTE — Op Note (Signed)
Operative Note  April Davenport  [id number]  [id number]  02/04/2021   Surgeon: [name] MD   Assistant: Feliciana Rossetti MD   Procedure performed: laparoscopic sleeve gastrectomy, upper endoscopy   Preop diagnosis: Morbid obesity Body mass index is 41.87 kg/m. Post-op diagnosis/intraop findings: same   Specimens: fundus Retained items: none  EBL: minimal  Complications: none   Description of procedure: After obtaining informed consent and administration of chemical DVT prophylaxis in holding, the patient was taken to the operating room and placed supine on operating room table where general endotracheal anesthesia was initiated, preoperative antibiotics were administered, SCDs applied, and a formal timeout was performed. The abdomen was prepped and draped in usual sterile fashion. Peritoneal access was gained using a Visiport technique in the left upper quadrant and insufflation to 15 mmHg ensued without issue. Gross inspection revealed no evidence of injury. Under direct visualization three more 5 mm trochars were placed in the right and left hemiabdomen and the 25mm trocar in the right paramedian upper abdomen. Bilateral laparoscopic assisted TAPS blocks were performed with Exparel diluted with 0.25 percent Marcaine with epinephrine. The patient was placed in steep Trendelenburg and the liver retractor was introduced through an incision in the upper midline and secured to the post externally to maintain the left lobe retracted anteriorly.  There was no hiatal hernia on direct inspection. Using the Harmonic scalpel, the greater curvature of the stomach was dissected away from the greater omentum and short gastric vessels were divided. This began 6 cm from the pylorus, and dissection proceeded until the left crus was clearly exposed. The 12 [place] VisiGi was then introduced and directed down towards the pylorus. This was placed to suction against the lesser curve. Serial fires of the linear  cutting stapler were then employed to create our sleeve. The first fire used a green load with seamguard and ensured adequate room at the angularis incisura. One gold load with seamguard and then several blue loads were then employed to create a narrow tubular stomach up to the angle of His. The excised stomach was then removed through our 15 mm trocar site within an Endo Catch bag.  The visigi was taken off of suction and a few puffs of air were introduced, inflating the sleeve. No bubbles were observed in the irrigation fluid around the stomach and the shape was noted to be evenly tubular without any narrowing at the angularis. The visigi was then removed. Upper endoscopy was performed by the assistant surgeon and the sleeve was noted to be airtight, the staple line was hemostatic. Please see his separate note. The endoscope was removed. The 15 mm trocar site fascia in the right upper abdomen was closed with a 0 Vicryl using the laparoscopic suture passer under direct visualization. The liver retractor was removed under direct visualization. The abdomen was then desufflated and all remaining trochars removed. The skin incisions were closed with subcuticular 4-0 Monocryl; benzoin, Steri-Strips and Band-Aids were applied The patient was then awakened, extubated and taken to PACU in stable condition.     All counts were correct at the completion of the case.

## 2021-02-05 ENCOUNTER — Encounter (HOSPITAL_COMMUNITY): Payer: Self-pay | Admitting: Surgery

## 2021-02-05 ENCOUNTER — Other Ambulatory Visit (HOSPITAL_COMMUNITY): Payer: Self-pay

## 2021-02-05 LAB — CBC WITH DIFFERENTIAL/PLATELET
Abs Immature Granulocytes: 0.03 10*3/uL (ref 0.00–0.07)
Basophils Absolute: 0 10*3/uL (ref 0.0–0.1)
Basophils Relative: 0 %
Eosinophils Absolute: 0 10*3/uL (ref 0.0–0.5)
Eosinophils Relative: 0 %
HCT: 31.1 % — ABNORMAL LOW (ref 36.0–46.0)
Hemoglobin: 9.3 g/dL — ABNORMAL LOW (ref 12.0–15.0)
Immature Granulocytes: 0 %
Lymphocytes Relative: 14 %
Lymphs Abs: 1.5 10*3/uL (ref 0.7–4.0)
MCH: 22.2 pg — ABNORMAL LOW (ref 26.0–34.0)
MCHC: 29.9 g/dL — ABNORMAL LOW (ref 30.0–36.0)
MCV: 74.4 fL — ABNORMAL LOW (ref 80.0–100.0)
Monocytes Absolute: 0.5 10*3/uL (ref 0.1–1.0)
Monocytes Relative: 4 %
Neutro Abs: 9.2 10*3/uL — ABNORMAL HIGH (ref 1.7–7.7)
Neutrophils Relative %: 82 %
Platelets: 345 10*3/uL (ref 150–400)
RBC: 4.18 MIL/uL (ref 3.87–5.11)
RDW: 17.3 % — ABNORMAL HIGH (ref 11.5–15.5)
WBC: 11.2 10*3/uL — ABNORMAL HIGH (ref 4.0–10.5)
nRBC: 0 % (ref 0.0–0.2)

## 2021-02-05 LAB — COMPREHENSIVE METABOLIC PANEL
ALT: 13 U/L (ref 0–44)
AST: 18 U/L (ref 15–41)
Albumin: 3.4 g/dL — ABNORMAL LOW (ref 3.5–5.0)
Alkaline Phosphatase: 78 U/L (ref 38–126)
Anion gap: 6 (ref 5–15)
BUN: 8 mg/dL (ref 6–20)
CO2: 23 mmol/L (ref 22–32)
Calcium: 8.8 mg/dL — ABNORMAL LOW (ref 8.9–10.3)
Chloride: 108 mmol/L (ref 98–111)
Creatinine, Ser: 0.58 mg/dL (ref 0.44–1.00)
GFR, Estimated: >60 mL/min (ref >60)
Glucose, Bld: 102 mg/dL — ABNORMAL HIGH (ref 70–99)
Potassium: 3.9 mmol/L (ref 3.5–5.1)
Sodium: 137 mmol/L (ref 135–145)
Total Bilirubin: 0.5 mg/dL (ref 0.3–1.2)
Total Protein: 7.1 g/dL (ref 6.5–8.1)

## 2021-02-05 LAB — MAGNESIUM: Magnesium: 1.7 mg/dL (ref 1.7–2.4)

## 2021-02-05 LAB — SURGICAL PATHOLOGY

## 2021-02-05 MED ORDER — BISACODYL 10 MG RE SUPP
10.0000 mg | Freq: Once | RECTAL | Status: AC
  Administered 2021-02-05: 10 mg via RECTAL
  Filled 2021-02-05: qty 1

## 2021-02-05 MED ORDER — METHOCARBAMOL 1000 MG/10ML IJ SOLN
500.0000 mg | Freq: Four times a day (QID) | INTRAVENOUS | Status: DC
  Administered 2021-02-05 – 2021-02-06 (×5): 500 mg via INTRAVENOUS
  Filled 2021-02-05: qty 500
  Filled 2021-02-05: qty 5
  Filled 2021-02-05 (×3): qty 500

## 2021-02-05 MED ORDER — MAGNESIUM SULFATE 50 % IJ SOLN
3.0000 g | Freq: Once | INTRAVENOUS | Status: AC
  Administered 2021-02-05: 3 g via INTRAVENOUS
  Filled 2021-02-05: qty 6

## 2021-02-05 NOTE — Discharge Instructions (Signed)

## 2021-02-05 NOTE — Progress Notes (Signed)
Patient alert and oriented, Post op day 1.  Provided support and encouragement.  Encouraged pulmonary toilet, ambulation and small sips of liquids.  All questions answered.  Will continue to monitor. 

## 2021-02-05 NOTE — Progress Notes (Signed)
Patient alert and oriented, pain is controlled. Patient is tolerating fluids, advanced to protein shake today, patient is tolerating well.  Reviewed Gastric sleeve discharge instructions with patient and patient is able to articulate understanding.  Provided information on BELT program, Support Group and [facility] outpatient pharmacy. All questions answered, will continue to monitor.  

## 2021-02-05 NOTE — Progress Notes (Signed)
Nutrition Education Note ° °Received consult for diet education for patient s/p bariatric surgery. ° °Discussed 2 week post op diet with pt. Emphasized that liquids must be non carbonated, non caffeinated, and sugar free. Fluid goals discussed. Pt to follow up with outpatient bariatric RD for further diet progression after 2 weeks. Multivitamins and minerals also reviewed. Teach back method used, pt expressed understanding, expect good compliance. ° °If nutrition issues arise, please consult RD. ° °April Mcminn, MS, RD, LDN °Inpatient Clinical Dietitian °Contact information available via Amion ° ° °

## 2021-02-05 NOTE — Progress Notes (Signed)
S: Minimal nausea, started having some central and lower abdominal cramps radiating to her back this morning when she started protein.  She has only taken about 10 mL of protein so far.  Last bowel movement was about 2 days ago.  She is passing flatus. She is doing well with incentive spirometry and walking in the halls.  O: Vitals, labs, intake/output, and orders reviewed at this time.  Afebrile, no tachycardia, normo to mildly hypertensive. PO 270, UOP 1500.  CMP-magnesium low at 1.7, otherwise unremarkable.  CBC-White count 11.2 (9.2 preop), hemoglobin 9.3 (10.3), platelets 345 (317)   Gen: A&Ox3, no distress  H&N: EOMI, atraumatic, neck supple Chest: unlabored respirations, RRR Abd: soft, nontender, nondistended, incision(s) c/d/i no cellulitis or hematoma.  Ext: warm, no edema Neuro: grossly normal  Lines/tubes/drains: PIV  A/P: POD 1 laparoscopic sleeve gastrectomy -Continue pulmonary toilet, ambulation, SCDs, lovenox -Hypomagnesemia, replace IV -Continue liquids and protein shakes as tolerated -Cramps-unclear etiology.  Will stop Reglan, try Dulcolax suppository.   Reassess progress later today.  [name], MD [facility], [place]

## 2021-02-06 ENCOUNTER — Other Ambulatory Visit (HOSPITAL_COMMUNITY): Payer: Self-pay

## 2021-02-06 MED ORDER — ACETAMINOPHEN 500 MG PO TABS: 1000.0000 mg | ORAL_TABLET | Freq: Three times a day (TID) | ORAL | 0 refills | Status: AC

## 2021-02-06 MED ORDER — PANTOPRAZOLE SODIUM 40 MG PO TBEC
40.0000 mg | DELAYED_RELEASE_TABLET | Freq: Every day | ORAL | 0 refills | Status: AC
  Filled 2021-02-06: qty 90, 90d supply, fill #0

## 2021-02-06 MED ORDER — METHOCARBAMOL 500 MG PO TABS
500.0000 mg | ORAL_TABLET | Freq: Four times a day (QID) | ORAL | 1 refills | Status: AC | PRN
  Filled 2021-02-06: qty 20, 5d supply, fill #0

## 2021-02-06 MED ORDER — PHENOL 1.4 % MT LIQD
1.0000 | OROMUCOSAL | Status: DC | PRN
  Filled 2021-02-06: qty 177

## 2021-02-06 MED ORDER — ONDANSETRON 4 MG PO TBDP
4.0000 mg | ORAL_TABLET | Freq: Four times a day (QID) | ORAL | 0 refills | Status: AC | PRN
  Filled 2021-02-06: qty 20, 5d supply, fill #0

## 2021-02-06 MED ORDER — TRAMADOL HCL 50 MG PO TABS
50.0000 mg | ORAL_TABLET | Freq: Four times a day (QID) | ORAL | 0 refills | Status: AC | PRN
  Filled 2021-02-06: qty 10, 3d supply, fill #0

## 2021-02-06 MED ORDER — CLARITIN-D 24 HOUR 10-240 MG PO TB24: 1.0000 | ORAL_TABLET | Freq: Every day | ORAL | 0 refills | Status: AC

## 2021-02-06 MED ORDER — GABAPENTIN 100 MG PO CAPS
200.0000 mg | ORAL_CAPSULE | Freq: Two times a day (BID) | ORAL | 0 refills | Status: AC
  Filled 2021-02-06: qty 20, 5d supply, fill #0

## 2021-02-06 NOTE — Progress Notes (Signed)
24hr fluid recall prior to discharge: 780mL.  Per dehydration protocol, will call pt to f/u within one week post op.  °

## 2021-02-06 NOTE — Progress Notes (Signed)
Patient alert and oriented, Post op day 2.  Provided support and encouragement.  Encouraged pulmonary toilet, ambulation and small sips of liquids.  All questions answered.  Will continue to monitor. Patient to be discharged after medications are delivered.

## 2021-02-06 NOTE — Progress Notes (Signed)
Discharge instructions given to patient and all questions were answered.  

## 2021-02-06 NOTE — Discharge Summary (Signed)
Physician Discharge Summary  April Davenport TFT:[id number] DOB: [birth year] DOA: 02/04/2021  PCP: Doreen Beam, FNP  Admit date: 02/04/2021 Discharge date: 02/06/2021   Recommendations for Outpatient Follow-up:    Follow-up Information     [name], MD. Go on 03/01/2021.   Specialty: General Surgery Why: at 4:10pm.  Please arrive 15 minutes prior to your appointment time. Thank you. Contact information: [address] [place] [address] [phone]         Surgery, [place]. Go on 03/22/2021.   Specialty: General Surgery Why: at 4:10pm with Dr. Kae Heller.  Please arrive 15 minutes prior to your appointment time. Thank you. Contact information: [name] [address] [phone]                Discharge Diagnoses:  Active Problems:   Morbid obesity (April Davenport)   Surgical Procedure: Laparoscopic Sleeve Gastrectomy, upper endoscopy  Discharge Condition: Good Disposition: Home  Diet recommendation: Postoperative sleeve gastrectomy diet (liquids only)  Filed Weights   02/04/21 0810 02/04/21 0827  Weight: 98 kg 97.3 kg     Hospital Course:  The patient was admitted for a planned laparoscopic sleeve gastrectomy. Please see operative note. Preoperatively the patient was given 5000 units of subcutaneous heparin for DVT prophylaxis. Postoperative prophylactic Lovenox dosing was started on the evening of postoperative day 0. ERAS protocol was used. On the evening of postoperative day 0, the patient was started on water and ice chips. On postoperative day 1 the patient had no fever or tachycardia and was tolerating water in their diet was gradually advanced throughout the day. The patient was ambulating without difficulty. Their vital signs are stable without fever or tachycardia. Their hemoglobin had remained stable. She had muscle cramps of the abdominal wall throughout the day which did seem to improve, but  given ongoing pain was kept inpatient an additional night. She remains afebrile without and concerning clinical findings on exam. She does note a history of allergies and a cough which has been going on since before surgery but is now slightly more phlegmy. She was counseled to try OTC allergy medication such as claritin daily for a couple weeks and benadryl as needed at night time, follow up with PCP if symptoms worsen or fail to resolve. The patient had received discharge instructions and counseling. They were deemed stable for discharge and had met discharge criteria   Discharge Instructions   Allergies as of 02/06/2021       Reactions   Dilaudid [hydromorphone Hcl] Hives, Shortness Of Breath, Itching   Syncope   [employer], Swelling   Coconut Flavor Swelling        Medication List     TAKE these medications    acetaminophen 500 MG tablet Commonly known as: TYLENOL Take 2 tablets (1,000 mg total) by mouth every 8 (eight) hours for 5 days.   cholecalciferol 25 MCG (1000 UNIT) tablet Commonly known as: VITAMIN D3 Take 1,000 Units by mouth daily.   Claritin-D 24 Hour 10-240 MG 24 hr tablet Generic drug: loratadine-pseudoephedrine Take 1 tablet by mouth daily.   ferrous sulfate 325 (65 FE) MG tablet Take 325 mg by mouth daily with breakfast.   gabapentin 100 MG capsule Commonly known as: NEURONTIN Take 2 capsules (200 mg total) by mouth every 12 (twelve) hours.   methocarbamol 500 MG tablet Commonly known as: Robaxin Take 1 tablet (500 mg total) by mouth every 6 (six) hours as  needed for muscle spasms.   ondansetron 4 MG disintegrating tablet Commonly known as: ZOFRAN-ODT Take 1 tablet (4 mg total) by mouth every 6 (six) hours as needed for nausea or vomiting.   pantoprazole 40 MG tablet Commonly known as: PROTONIX Take 1 tablet (40 mg total) by mouth daily.   traMADol 50 MG tablet Commonly known as: ULTRAM Take 1 tablet (50 mg total) by mouth every  6 (six) hours as needed (pain).        Follow-up Information     [name], MD. Go on 03/01/2021.   Specialty: General Surgery Why: at 4:10pm.  Please arrive 15 minutes prior to your appointment time. Thank you. Contact information: [address] [place] [address] [phone]         Surgery, [place]. Go on 03/22/2021.   Specialty: General Surgery Why: at 4:10pm with Dr. Kae Heller.  Please arrive 15 minutes prior to your appointment time. Thank you. Contact information: [place] [address] [phone]                  The results of significant diagnostics from this hospitalization (including imaging, microbiology, ancillary and laboratory) are listed below for reference.    Significant Diagnostic Studies: No results found.  Labs: Basic Metabolic Panel: Recent Labs  Lab 02/01/21 0820 02/05/21 0423  NA 136 137  K 3.8 3.9  CL 101 108  CO2 27 23  GLUCOSE 83 102*  BUN 14 8  CREATININE 0.66 0.58  CALCIUM 9.1 8.8*  MG  --  1.7   Liver Function Tests: Recent Labs  Lab 02/01/21 0820 02/05/21 0423  AST 15 18  ALT 11 13  ALKPHOS 91 78  BILITOT 0.6 0.5  PROT 7.8 7.1  ALBUMIN 3.8 3.4*    CBC: Recent Labs  Lab 02/01/21 0820 02/05/21 0423  WBC 9.2 11.2*  NEUTROABS 6.3 9.2*  HGB 10.3* 9.3*  HCT 35.3* 31.1*  MCV 76.1* 74.4*  PLT 317 345    CBG: No results for input(s): GLUCAP in the last 168 hours.  Active Problems:   Morbid obesity (April Davenport)    Signed:  [facility], [place] [phone] 02/06/2021, 8:46 AM

## 2021-02-08 ENCOUNTER — Telehealth (HOSPITAL_COMMUNITY): Payer: Self-pay | Admitting: *Deleted

## 2021-02-08 DIAGNOSIS — Z9884 Bariatric surgery status: Secondary | ICD-10-CM | POA: Diagnosis not present

## 2021-02-12 ENCOUNTER — Telehealth: Payer: Self-pay

## 2021-02-12 ENCOUNTER — Telehealth (HOSPITAL_COMMUNITY): Payer: Self-pay | Admitting: *Deleted

## 2021-02-12 NOTE — Telephone Encounter (Signed)
Transition Care Management Unsuccessful Follow-up Telephone Call  02/12/21 Late Entry for 02/08/21 2:57 pm  Date of discharge and from where:  02/06/21 from [facility]  Attempts:  1st Attempt  Reason for unsuccessful TCM follow-up call:  Left voice message     Kathyrn Sheriff, RN, MSN, BSN, CCM [facility] Care Management Coordinator [phone]

## 2021-02-12 NOTE — Telephone Encounter (Signed)
1.  Tell me about your pain and pain management? Pt denies any pain.  2.  Let's talk about fluid intake.  How much total fluid are you taking in? Pt states that s/he is getting in at least 56-64 oz of fluid including protein shakes/water, bottled water, cream soups and broth  Pt instructed to assess status and suggestions daily utilizing Hydration Action Plan on discharge folder and to call [facility] if in the "red zone".   3.  How much protein have you taken in the last 2 days? Pt states that she is working to meet goal of goal of 60g of protein today.  Pt has already currently drinking one protein shake and had one yogurt.  Pt plans to drink remainder of protein throughout the rest of the day to meet goal.  Instructed pt to prioritize protein each day to ensure that she meets her goal.  4.  Have you had nausea?  Tell me about when have experienced nausea and what you did to help? Pt denies nausea.   5.  Has the frequency or color changed with your urine? Pt states that she is urinating "fine" with no changes in frequency or urgency.     6.  Tell me what your incisions look like? Pt states that she has discussed with Dr. Fredricka Bonine about the "rash" that has spread all over her stomach.  She states that it is mostly itching.  Pt denies a fever, chills.  Pt states that incisions are not swollen, open, or draining.  Pt encouraged to call [facility] if symptoms worsen.  7.  Have you been passing gas? BM? Pt states that she has not had a BM.  Pt instructed to take either Miralax or MoM as instructed per "Gastric Bypass/Sleeve Discharge Home Care Instructions".  Pt to call surgeon's office if not able to have BM with medication.   8.  If a problem or question were to arise who would you call?  Do you know contact numbers for [facility], [facility], and [facility]? Pt denies current dehydration symptoms.  Pt can describe s/sx of dehydration and is cautious about changing positions too quickly.  Pt knows to call [facility] for surgical, [facility]  for nutrition, and [facility] for non-urgent questions or concerns.   9.  How has the walking going? Pt states she is walking around and able to be active without difficulty.  Pt has returned to work and is currently in her office.   10. Are you still using your incentive spirometer?  If so, how often? Pt states that she uses it 1-2x every hour while at home. Pt encouraged to use incentive spirometer, at least 10x every hour while awake until s/he sees the surgeon.  11.  How are your vitamins and calcium going?  How are you taking them? Pt states that she is taking her supplements and vitamins without difficulty.  Reminded patient that the first 30 days post-operatively are important for successful recovery.  Practice good hand hygiene, wearing a mask when appropriate (since optional in most places), and minimizing exposure to people who live outside of the home, especially if they are exhibiting any respiratory, GI, or illness-like symptoms.

## 2021-02-12 NOTE — Telephone Encounter (Addendum)
Transition Care Management Follow-up Telephone Call Date of discharge and from where: 02/06/21 [facility] How have you been since you were released from the hospital? "I've been fine" Any questions or concerns? No  Items Reviewed: Did the pt receive and understand the discharge instructions provided? Yes  Medications obtained and verified? Yes  Other? No  Any new allergies since your discharge? No  Dietary orders reviewed? Yes Do you have support at home? Yes   Home Care and Equipment/Supplies: Were home health services ordered? no If so, what is the name of the agency? not applicable  Has the agency set up a time to come to the patient's home? not applicable Were any new equipment or medical supplies ordered?  No What is the name of the medical supply agency? not applicable Were you able to get the supplies/equipment? not applicable Do you have any questions related to the use of the equipment or supplies? No  Functional Questionnaire: (I = Independent and D = Dependent) ADLs: I  Bathing/Dressing- I  Meal Prep- I  Eating- I  Maintaining continence- I  Transferring/Ambulation- I  Managing Meds- I  Follow up appointments reviewed:  PCP Hospital f/u appt confirmed? No . Specialist Hospital f/u appt confirmed? Yes  Scheduled to see 03/01/21 on  @ 3:55 pm. Are transportation arrangements needed? No  If their condition worsens, is the pt aware to call PCP or go to the Emergency Dept.? Yes Was the patient provided with contact information for the PCP's office or ED? Yes Was to pt encouraged to call back with questions or concerns? Yes   Kathyrn Sheriff, RN, MSN, BSN, CCM [facility] Care Management Coordinator [phone]

## 2021-02-19 ENCOUNTER — Encounter: Payer: Federal, State, Local not specified - PPO | Attending: Surgery | Admitting: Skilled Nursing Facility1

## 2021-02-19 ENCOUNTER — Other Ambulatory Visit: Payer: Self-pay

## 2021-02-20 NOTE — Progress Notes (Signed)
2 Week Post-Operative Nutrition Class   Patient was seen on 02/19/2021 for Post-Operative Nutrition education at [facility].    Surgery date: 02/04/2021 Surgery type: sleeve Start weight at [facility]: 208.4 Weight today: 199.1 pounds Bowel Habits: Every day to every other day no complaints   Body Composition Scale 02/19/2021  Current Body Weight 199.1  Total Body Fat % 41.3  Visceral Fat 11  Fat-Free Mass % 58.6   Total Body Water % 43.8  Muscle-Mass lbs 29.1  BMI 38.6  Body Fat Displacement          Torso  lbs 50.9         Left Leg  lbs 10.1         Right Leg  lbs 10.1         Left Arm  lbs 5         Right Arm   lbs 5      The following the learning objectives were met by the patient during this course: Identifies Phase 3 (Soft, High Proteins) Dietary Goals and will begin from 2 weeks post-operatively to 2 months post-operatively Identifies appropriate sources of fluids and proteins  Identifies appropriate fat sources and healthy verses unhealthy fat types   States protein recommendations and appropriate sources post-operatively Identifies the need for appropriate texture modifications, mastication, and bite sizes when consuming solids Identifies appropriate fat consumption and sources Identifies appropriate multivitamin and calcium sources post-operatively Describes the need for physical activity post-operatively and will follow MD recommendations States when to call healthcare provider regarding medication questions or post-operative complications   Handouts given during class include: Phase 3A: Soft, High Protein Diet Handout Phase 3 High Protein Meals Healthy Fats   Follow-Up Plan: Patient will follow-up at [facility] in 6 weeks for 2 month post-op nutrition visit for diet advancement per MD.

## 2021-02-25 ENCOUNTER — Telehealth: Payer: Self-pay | Admitting: Skilled Nursing Facility1

## 2021-02-25 NOTE — Telephone Encounter (Signed)
RD called pt to verify fluid intake once starting soft, solid proteins 2 week post-bariatric surgery.   Daily Fluid intake: 55-64oz Daily Protein intake: 60 g Bowel Habits: every day to every other day  Concerns/issues:   Pt states she has had trouble with animal meats but beans are just fine.  Dietitian advised pt to try more of the plant based proteins.

## 2021-04-03 ENCOUNTER — Ambulatory Visit: Payer: Federal, State, Local not specified - PPO | Admitting: Skilled Nursing Facility1

## 2021-04-08 DIAGNOSIS — Z113 Encounter for screening for infections with a predominantly sexual mode of transmission: Secondary | ICD-10-CM | POA: Diagnosis not present

## 2021-04-08 DIAGNOSIS — N898 Other specified noninflammatory disorders of vagina: Secondary | ICD-10-CM | POA: Diagnosis not present

## 2021-06-09 DIAGNOSIS — J028 Acute pharyngitis due to other specified organisms: Secondary | ICD-10-CM | POA: Diagnosis not present

## 2021-06-09 DIAGNOSIS — Z20822 Contact with and (suspected) exposure to covid-19: Secondary | ICD-10-CM | POA: Diagnosis not present

## 2021-06-09 DIAGNOSIS — Z87891 Personal history of nicotine dependence: Secondary | ICD-10-CM | POA: Diagnosis not present

## 2021-06-09 DIAGNOSIS — J029 Acute pharyngitis, unspecified: Secondary | ICD-10-CM | POA: Diagnosis not present

## 2021-06-09 DIAGNOSIS — R0981 Nasal congestion: Secondary | ICD-10-CM | POA: Diagnosis not present

## 2021-06-09 DIAGNOSIS — R519 Headache, unspecified: Secondary | ICD-10-CM | POA: Diagnosis not present

## 2021-06-09 DIAGNOSIS — I10 Essential (primary) hypertension: Secondary | ICD-10-CM | POA: Diagnosis not present

## 2021-06-09 DIAGNOSIS — Z91018 Allergy to other foods: Secondary | ICD-10-CM | POA: Diagnosis not present

## 2021-06-14 ENCOUNTER — Telehealth: Payer: Self-pay | Admitting: Adult Health

## 2021-06-14 ENCOUNTER — Ambulatory Visit: Payer: Self-pay | Admitting: *Deleted

## 2021-06-14 NOTE — Telephone Encounter (Signed)
Summary: culture for strep test/sore throat   Pt seeking clinical advice about a culture for strep test she had done at the ER for . Pt stated she was advised to contact her PCP to follow up for medication. Pt stated she isn't feeling well and has a sore throat.   Pt was seen at [facility]     Attempted to call patient- left message for patient to call office

## 2021-06-14 NOTE — Telephone Encounter (Signed)
Pt. Called back and report she is a pt. At [facility]. Given the phone number as requested.

## 2021-06-14 NOTE — Telephone Encounter (Signed)
Patient called in stated went to [facility] er and they did  several  test all came back negative,Patient stated that we would need to call antibiotic to her pharmacy because [facility]  saw some growth on  culture. Please call patient @ [phone]. I offer to schedule patient an appointment and patient decline and stated she just need antibiotics prescribe

## 2021-06-14 NOTE — Telephone Encounter (Signed)
3rd attempt to reach patient. Left message to call office to discuss symptoms.

## 2021-06-14 NOTE — Telephone Encounter (Signed)
2nd attempt, Patient called, left VM to return the call to the office to discuss symptoms with a nurse.  

## 2021-06-15 DIAGNOSIS — R6883 Chills (without fever): Secondary | ICD-10-CM | POA: Diagnosis not present

## 2021-06-15 DIAGNOSIS — J029 Acute pharyngitis, unspecified: Secondary | ICD-10-CM | POA: Diagnosis not present

## 2021-06-15 DIAGNOSIS — Z9889 Other specified postprocedural states: Secondary | ICD-10-CM | POA: Diagnosis not present

## 2021-06-15 DIAGNOSIS — Z87891 Personal history of nicotine dependence: Secondary | ICD-10-CM | POA: Diagnosis not present

## 2021-06-15 DIAGNOSIS — Z9102 Food additives allergy status: Secondary | ICD-10-CM | POA: Diagnosis not present

## 2021-06-15 DIAGNOSIS — Z79899 Other long term (current) drug therapy: Secondary | ICD-10-CM | POA: Diagnosis not present

## 2021-06-15 DIAGNOSIS — J020 Streptococcal pharyngitis: Secondary | ICD-10-CM | POA: Diagnosis not present

## 2021-06-23 DIAGNOSIS — I10 Essential (primary) hypertension: Secondary | ICD-10-CM | POA: Diagnosis not present

## 2021-06-23 DIAGNOSIS — Z885 Allergy status to narcotic agent status: Secondary | ICD-10-CM | POA: Diagnosis not present

## 2021-06-23 DIAGNOSIS — Z20822 Contact with and (suspected) exposure to covid-19: Secondary | ICD-10-CM | POA: Diagnosis not present

## 2021-06-23 DIAGNOSIS — Z791 Long term (current) use of non-steroidal anti-inflammatories (NSAID): Secondary | ICD-10-CM | POA: Diagnosis not present

## 2021-06-23 DIAGNOSIS — Z91018 Allergy to other foods: Secondary | ICD-10-CM | POA: Diagnosis not present

## 2021-06-23 DIAGNOSIS — Z9884 Bariatric surgery status: Secondary | ICD-10-CM | POA: Diagnosis not present

## 2021-06-23 DIAGNOSIS — R109 Unspecified abdominal pain: Secondary | ICD-10-CM | POA: Diagnosis not present

## 2021-06-23 DIAGNOSIS — R112 Nausea with vomiting, unspecified: Secondary | ICD-10-CM | POA: Diagnosis not present

## 2021-06-23 DIAGNOSIS — R197 Diarrhea, unspecified: Secondary | ICD-10-CM | POA: Diagnosis not present

## 2021-06-23 DIAGNOSIS — A419 Sepsis, unspecified organism: Secondary | ICD-10-CM | POA: Diagnosis not present

## 2021-06-23 DIAGNOSIS — R531 Weakness: Secondary | ICD-10-CM | POA: Diagnosis not present

## 2021-06-23 DIAGNOSIS — R1030 Lower abdominal pain, unspecified: Secondary | ICD-10-CM | POA: Diagnosis not present

## 2021-06-23 DIAGNOSIS — Z87891 Personal history of nicotine dependence: Secondary | ICD-10-CM | POA: Diagnosis not present

## 2021-08-30 DIAGNOSIS — N6452 Nipple discharge: Secondary | ICD-10-CM | POA: Diagnosis not present

## 2021-08-30 DIAGNOSIS — Z9884 Bariatric surgery status: Secondary | ICD-10-CM | POA: Diagnosis not present

## 2021-08-30 DIAGNOSIS — M25511 Pain in right shoulder: Secondary | ICD-10-CM | POA: Diagnosis not present

## 2021-08-30 DIAGNOSIS — M545 Low back pain, unspecified: Secondary | ICD-10-CM | POA: Diagnosis not present

## 2021-09-10 DIAGNOSIS — M7918 Myalgia, other site: Secondary | ICD-10-CM | POA: Diagnosis not present

## 2021-09-10 DIAGNOSIS — M5416 Radiculopathy, lumbar region: Secondary | ICD-10-CM | POA: Diagnosis not present

## 2021-09-10 DIAGNOSIS — M47816 Spondylosis without myelopathy or radiculopathy, lumbar region: Secondary | ICD-10-CM | POA: Diagnosis not present

## 2021-09-10 DIAGNOSIS — M545 Low back pain, unspecified: Secondary | ICD-10-CM | POA: Diagnosis not present

## 2021-09-11 DIAGNOSIS — N6041 Mammary duct ectasia of right breast: Secondary | ICD-10-CM | POA: Diagnosis not present

## 2021-09-11 DIAGNOSIS — N644 Mastodynia: Secondary | ICD-10-CM | POA: Diagnosis not present

## 2021-09-11 DIAGNOSIS — N6042 Mammary duct ectasia of left breast: Secondary | ICD-10-CM | POA: Diagnosis not present

## 2021-09-11 DIAGNOSIS — N6452 Nipple discharge: Secondary | ICD-10-CM | POA: Diagnosis not present

## 2021-10-11 DIAGNOSIS — N6452 Nipple discharge: Secondary | ICD-10-CM | POA: Diagnosis not present

## 2021-10-17 DIAGNOSIS — M47816 Spondylosis without myelopathy or radiculopathy, lumbar region: Secondary | ICD-10-CM | POA: Diagnosis not present

## 2021-10-17 DIAGNOSIS — M545 Low back pain, unspecified: Secondary | ICD-10-CM | POA: Diagnosis not present

## 2021-10-17 DIAGNOSIS — M48061 Spinal stenosis, lumbar region without neurogenic claudication: Secondary | ICD-10-CM | POA: Diagnosis not present

## 2021-10-17 DIAGNOSIS — M7918 Myalgia, other site: Secondary | ICD-10-CM | POA: Diagnosis not present

## 2021-10-17 DIAGNOSIS — M5416 Radiculopathy, lumbar region: Secondary | ICD-10-CM | POA: Diagnosis not present

## 2021-10-17 DIAGNOSIS — R293 Abnormal posture: Secondary | ICD-10-CM | POA: Diagnosis not present

## 2021-10-17 DIAGNOSIS — M5116 Intervertebral disc disorders with radiculopathy, lumbar region: Secondary | ICD-10-CM | POA: Diagnosis not present

## 2021-11-18 DIAGNOSIS — N6452 Nipple discharge: Secondary | ICD-10-CM | POA: Diagnosis not present

## 2021-11-26 DIAGNOSIS — R928 Other abnormal and inconclusive findings on diagnostic imaging of breast: Secondary | ICD-10-CM | POA: Diagnosis not present

## 2021-11-26 DIAGNOSIS — N6452 Nipple discharge: Secondary | ICD-10-CM | POA: Diagnosis not present

## 2021-11-26 DIAGNOSIS — N6082 Other benign mammary dysplasias of left breast: Secondary | ICD-10-CM | POA: Diagnosis not present

## 2021-11-27 ENCOUNTER — Encounter (HOSPITAL_COMMUNITY): Payer: Self-pay | Admitting: *Deleted

## 2021-12-17 DIAGNOSIS — E669 Obesity, unspecified: Secondary | ICD-10-CM | POA: Diagnosis not present

## 2021-12-19 DIAGNOSIS — Z9884 Bariatric surgery status: Secondary | ICD-10-CM | POA: Diagnosis not present

## 2021-12-19 DIAGNOSIS — Z09 Encounter for follow-up examination after completed treatment for conditions other than malignant neoplasm: Secondary | ICD-10-CM | POA: Diagnosis not present

## 2021-12-27 DIAGNOSIS — R87810 Cervical high risk human papillomavirus (HPV) DNA test positive: Secondary | ICD-10-CM | POA: Diagnosis not present

## 2021-12-27 DIAGNOSIS — R87610 Atypical squamous cells of undetermined significance on cytologic smear of cervix (ASC-US): Secondary | ICD-10-CM | POA: Diagnosis not present

## 2021-12-27 DIAGNOSIS — N941 Unspecified dyspareunia: Secondary | ICD-10-CM | POA: Diagnosis not present

## 2021-12-27 DIAGNOSIS — Z8742 Personal history of other diseases of the female genital tract: Secondary | ICD-10-CM | POA: Diagnosis not present

## 2021-12-27 DIAGNOSIS — Z124 Encounter for screening for malignant neoplasm of cervix: Secondary | ICD-10-CM | POA: Diagnosis not present

## 2021-12-27 DIAGNOSIS — D508 Other iron deficiency anemias: Secondary | ICD-10-CM | POA: Diagnosis not present

## 2021-12-30 DIAGNOSIS — D508 Other iron deficiency anemias: Secondary | ICD-10-CM | POA: Diagnosis not present

## 2022-01-08 DIAGNOSIS — D508 Other iron deficiency anemias: Secondary | ICD-10-CM | POA: Diagnosis not present

## 2022-03-03 DIAGNOSIS — Z113 Encounter for screening for infections with a predominantly sexual mode of transmission: Secondary | ICD-10-CM | POA: Diagnosis not present

## 2022-03-03 DIAGNOSIS — N898 Other specified noninflammatory disorders of vagina: Secondary | ICD-10-CM | POA: Diagnosis not present

## 2022-03-06 DIAGNOSIS — Z9884 Bariatric surgery status: Secondary | ICD-10-CM | POA: Diagnosis not present

## 2022-03-06 DIAGNOSIS — D649 Anemia, unspecified: Secondary | ICD-10-CM | POA: Diagnosis not present

## 2022-03-06 DIAGNOSIS — D508 Other iron deficiency anemias: Secondary | ICD-10-CM | POA: Diagnosis not present

## 2022-03-13 DIAGNOSIS — D508 Other iron deficiency anemias: Secondary | ICD-10-CM | POA: Diagnosis not present

## 2022-04-07 DIAGNOSIS — R059 Cough, unspecified: Secondary | ICD-10-CM | POA: Diagnosis not present

## 2022-04-07 DIAGNOSIS — R0981 Nasal congestion: Secondary | ICD-10-CM | POA: Diagnosis not present

## 2022-04-07 DIAGNOSIS — R6889 Other general symptoms and signs: Secondary | ICD-10-CM | POA: Diagnosis not present

## 2022-04-07 DIAGNOSIS — J029 Acute pharyngitis, unspecified: Secondary | ICD-10-CM | POA: Diagnosis not present

## 2022-07-03 ENCOUNTER — Other Ambulatory Visit: Payer: Self-pay | Admitting: Surgery

## 2022-07-03 DIAGNOSIS — R1013 Epigastric pain: Secondary | ICD-10-CM

## 2022-07-23 ENCOUNTER — Other Ambulatory Visit: Payer: Federal, State, Local not specified - PPO

## 2022-07-24 ENCOUNTER — Ambulatory Visit
Admission: RE | Admit: 2022-07-24 | Discharge: 2022-07-24 | Disposition: A | Discharge status: Home / Self Care | Payer: Federal, State, Local not specified - PPO | Source: Ambulatory Visit | Attending: Surgery | Admitting: Surgery

## 2022-07-24 DIAGNOSIS — R1013 Epigastric pain: Secondary | ICD-10-CM

## 2023-09-11 ENCOUNTER — Encounter (HOSPITAL_COMMUNITY): Payer: Self-pay | Admitting: *Deleted
